# Patient Record
Sex: Female | Born: 1960 | Race: White | Hispanic: No | Marital: Married | Smoking: Former smoker
Health system: Southern US, Community
[De-identification: ages and names within clinical notes are randomized; demographics above are authoritative.]

## PROBLEM LIST (undated history)

## (undated) DIAGNOSIS — E079 Disorder of thyroid, unspecified: Secondary | ICD-10-CM

## (undated) DIAGNOSIS — M1990 Unspecified osteoarthritis, unspecified site: Secondary | ICD-10-CM

## (undated) DIAGNOSIS — K219 Gastro-esophageal reflux disease without esophagitis: Secondary | ICD-10-CM

## (undated) DIAGNOSIS — E785 Hyperlipidemia, unspecified: Secondary | ICD-10-CM

## (undated) DIAGNOSIS — E1165 Type 2 diabetes mellitus with hyperglycemia: Secondary | ICD-10-CM

## (undated) DIAGNOSIS — T7840XA Allergy, unspecified, initial encounter: Secondary | ICD-10-CM

## (undated) DIAGNOSIS — R51 Headache: Secondary | ICD-10-CM

## (undated) DIAGNOSIS — I10 Essential (primary) hypertension: Secondary | ICD-10-CM

## (undated) HISTORY — PX: BREAST SURGERY: SHX581

## (undated) HISTORY — DX: Hyperlipidemia, unspecified: E78.5

## (undated) HISTORY — DX: Unspecified osteoarthritis, unspecified site: M19.90

## (undated) HISTORY — DX: Gastro-esophageal reflux disease without esophagitis: K21.9

## (undated) HISTORY — DX: Disorder of thyroid, unspecified: E07.9

## (undated) HISTORY — DX: Allergy, unspecified, initial encounter: T78.40XA

## (undated) HISTORY — DX: Headache: R51

## (undated) HISTORY — DX: Type 2 diabetes mellitus with hyperglycemia: E11.65

## (undated) HISTORY — DX: Essential (primary) hypertension: I10

---

## 1999-03-18 ENCOUNTER — Emergency Department (HOSPITAL_COMMUNITY): Admission: EM | Admit: 1999-03-18 | Discharge: 1999-03-19 | Payer: Self-pay | Admitting: Emergency Medicine

## 1999-03-18 ENCOUNTER — Encounter: Payer: Self-pay | Admitting: Emergency Medicine

## 1999-03-19 ENCOUNTER — Encounter: Payer: Self-pay | Admitting: Emergency Medicine

## 2003-06-12 ENCOUNTER — Emergency Department (HOSPITAL_COMMUNITY): Admission: AD | Admit: 2003-06-12 | Discharge: 2003-06-12 | Payer: Self-pay | Admitting: Family Medicine

## 2003-06-12 ENCOUNTER — Emergency Department (HOSPITAL_COMMUNITY): Admission: EM | Admit: 2003-06-12 | Discharge: 2003-06-12 | Payer: Self-pay | Admitting: Emergency Medicine

## 2006-10-04 ENCOUNTER — Encounter: Admission: RE | Admit: 2006-10-04 | Discharge: 2006-10-04 | Payer: Self-pay | Admitting: Internal Medicine

## 2006-10-13 ENCOUNTER — Encounter (INDEPENDENT_AMBULATORY_CARE_PROVIDER_SITE_OTHER): Payer: Self-pay | Admitting: Radiology

## 2006-10-13 ENCOUNTER — Encounter: Admission: RE | Admit: 2006-10-13 | Discharge: 2006-10-13 | Payer: Self-pay | Admitting: Internal Medicine

## 2008-06-04 ENCOUNTER — Ambulatory Visit: Payer: Self-pay | Admitting: Internal Medicine

## 2008-06-04 DIAGNOSIS — I10 Essential (primary) hypertension: Secondary | ICD-10-CM | POA: Insufficient documentation

## 2008-06-04 DIAGNOSIS — K219 Gastro-esophageal reflux disease without esophagitis: Secondary | ICD-10-CM | POA: Insufficient documentation

## 2008-06-04 DIAGNOSIS — M533 Sacrococcygeal disorders, not elsewhere classified: Secondary | ICD-10-CM | POA: Insufficient documentation

## 2008-06-04 DIAGNOSIS — IMO0002 Reserved for concepts with insufficient information to code with codable children: Secondary | ICD-10-CM | POA: Insufficient documentation

## 2008-06-04 DIAGNOSIS — E785 Hyperlipidemia, unspecified: Secondary | ICD-10-CM | POA: Insufficient documentation

## 2008-06-04 DIAGNOSIS — M159 Polyosteoarthritis, unspecified: Secondary | ICD-10-CM | POA: Insufficient documentation

## 2008-06-04 LAB — CONVERTED CEMR LAB
AST: 20 units/L (ref 0–37)
Albumin: 4.1 g/dL (ref 3.5–5.2)
BUN: 15 mg/dL (ref 6–23)
Basophils Absolute: 0.1 10*3/uL (ref 0.0–0.1)
Bilirubin Urine: NEGATIVE
Chloride: 98 meq/L (ref 96–112)
Creatinine, Ser: 1.2 mg/dL (ref 0.4–1.2)
Crystals: NEGATIVE
Folate: 11.5 ng/mL
GFR calc Af Amer: 62 mL/min
GFR calc non Af Amer: 51 mL/min
Hgb A1c MFr Bld: 5.7 % (ref 4.6–6.0)
Iron: 98 ug/dL (ref 42–145)
Leukocytes, UA: NEGATIVE
Lymphocytes Relative: 34.9 % (ref 12.0–46.0)
MCHC: 35 g/dL (ref 30.0–36.0)
Monocytes Relative: 8 % (ref 3.0–12.0)
Nitrite: POSITIVE — AB
Platelets: 246 10*3/uL (ref 150–400)
RDW: 12.3 % (ref 11.5–14.6)
Saturation Ratios: 21.3 % (ref 20.0–50.0)
Sed Rate: 14 mm/hr (ref 0–22)
Total Bilirubin: 0.7 mg/dL (ref 0.3–1.2)
Transferrin: 329.1 mg/dL (ref >=212.0)
Urobilinogen, UA: 0.2 (ref 0.0–1.0)
Vitamin B-12: 718 pg/mL (ref 211–911)

## 2008-06-05 ENCOUNTER — Telehealth: Payer: Self-pay | Admitting: Internal Medicine

## 2008-06-05 ENCOUNTER — Encounter: Payer: Self-pay | Admitting: Internal Medicine

## 2008-06-18 ENCOUNTER — Ambulatory Visit: Payer: Self-pay | Admitting: Internal Medicine

## 2008-06-18 DIAGNOSIS — E039 Hypothyroidism, unspecified: Secondary | ICD-10-CM | POA: Insufficient documentation

## 2008-06-18 DIAGNOSIS — R7309 Other abnormal glucose: Secondary | ICD-10-CM | POA: Insufficient documentation

## 2008-06-18 LAB — CONVERTED CEMR LAB
Bilirubin Urine: NEGATIVE
CO2: 28 meq/L (ref 19–32)
Calcium: 8.9 mg/dL (ref 8.4–10.5)
Creatinine, Ser: 0.9 mg/dL (ref 0.4–1.2)
Crystals: NEGATIVE
GFR calc Af Amer: 86 mL/min
HDL: 38.5 mg/dL — ABNORMAL LOW (ref >39.0)
Hgb A1c MFr Bld: 5.6 % (ref 4.6–6.0)
LDL Goal: 160 mg/dL
Nitrite: NEGATIVE
Sodium: 140 meq/L (ref 135–145)
Total CHOL/HDL Ratio: 5.8
Total Protein, Urine: NEGATIVE mg/dL
Triglycerides: 127 mg/dL (ref 0–149)
Urobilinogen, UA: 0.2 (ref 0.0–1.0)
VLDL: 25 mg/dL (ref 0–40)

## 2008-06-19 ENCOUNTER — Encounter: Payer: Self-pay | Admitting: Internal Medicine

## 2008-06-20 ENCOUNTER — Encounter: Payer: Self-pay | Admitting: Internal Medicine

## 2008-06-25 ENCOUNTER — Telehealth: Payer: Self-pay | Admitting: Internal Medicine

## 2008-07-20 ENCOUNTER — Encounter (INDEPENDENT_AMBULATORY_CARE_PROVIDER_SITE_OTHER): Payer: Self-pay | Admitting: *Deleted

## 2008-07-26 ENCOUNTER — Ambulatory Visit: Payer: Self-pay | Admitting: Internal Medicine

## 2008-07-26 LAB — CONVERTED CEMR LAB: TSH: 4.65 microintl units/mL (ref 0.35–5.50)

## 2008-08-21 ENCOUNTER — Encounter
Admission: RE | Admit: 2008-08-21 | Discharge: 2008-11-19 | Payer: Self-pay | Admitting: Physical Medicine & Rehabilitation

## 2008-08-27 ENCOUNTER — Ambulatory Visit: Payer: Self-pay | Admitting: Physical Medicine & Rehabilitation

## 2008-09-05 ENCOUNTER — Ambulatory Visit (HOSPITAL_COMMUNITY)
Admission: RE | Admit: 2008-09-05 | Discharge: 2008-09-05 | Payer: Self-pay | Admitting: Physical Medicine & Rehabilitation

## 2008-09-11 ENCOUNTER — Ambulatory Visit: Payer: Self-pay | Admitting: Physical Medicine & Rehabilitation

## 2008-10-01 ENCOUNTER — Ambulatory Visit: Payer: Self-pay | Admitting: Internal Medicine

## 2008-10-11 ENCOUNTER — Encounter
Admission: RE | Admit: 2008-10-11 | Discharge: 2008-12-04 | Payer: Self-pay | Admitting: Physical Medicine & Rehabilitation

## 2008-10-15 ENCOUNTER — Ambulatory Visit: Payer: Self-pay | Admitting: Physical Medicine & Rehabilitation

## 2008-10-18 ENCOUNTER — Ambulatory Visit: Payer: Self-pay | Admitting: Physical Medicine & Rehabilitation

## 2008-11-05 ENCOUNTER — Telehealth: Payer: Self-pay | Admitting: Internal Medicine

## 2008-11-05 ENCOUNTER — Ambulatory Visit: Payer: Self-pay | Admitting: Internal Medicine

## 2008-11-05 DIAGNOSIS — Z780 Asymptomatic menopausal state: Secondary | ICD-10-CM | POA: Insufficient documentation

## 2008-11-05 DIAGNOSIS — N390 Urinary tract infection, site not specified: Secondary | ICD-10-CM | POA: Insufficient documentation

## 2008-11-05 LAB — CONVERTED CEMR LAB
CO2: 29 meq/L (ref 19–32)
Chloride: 108 meq/L (ref 96–112)
Creatinine, Ser: 1 mg/dL (ref 0.4–1.2)
Glucose, Bld: 97 mg/dL (ref 70–99)
Leukocytes, UA: NEGATIVE
Nitrite: NEGATIVE
Sodium: 142 meq/L (ref 135–145)
Specific Gravity, Urine: 1.015 (ref 1.000–1.030)
Total Protein, Urine: NEGATIVE mg/dL
hCG, Beta Chain, Quant, S: 0.18 milliintl units/mL
pH: 6 (ref 5.0–8.0)

## 2008-11-08 ENCOUNTER — Telehealth: Payer: Self-pay | Admitting: Internal Medicine

## 2008-11-29 ENCOUNTER — Ambulatory Visit: Payer: Self-pay | Admitting: Physical Medicine & Rehabilitation

## 2009-02-14 ENCOUNTER — Ambulatory Visit: Payer: Self-pay | Admitting: Internal Medicine

## 2009-02-15 ENCOUNTER — Encounter: Payer: Self-pay | Admitting: Internal Medicine

## 2009-03-05 ENCOUNTER — Encounter: Payer: Self-pay | Admitting: Internal Medicine

## 2009-07-18 ENCOUNTER — Ambulatory Visit: Payer: Self-pay | Admitting: Internal Medicine

## 2009-07-18 DIAGNOSIS — E8881 Metabolic syndrome: Secondary | ICD-10-CM | POA: Insufficient documentation

## 2009-07-18 LAB — CONVERTED CEMR LAB
ALT: 23 units/L (ref 0–35)
BUN: 12 mg/dL (ref 6–23)
CO2: 28 meq/L (ref 19–32)
Chloride: 103 meq/L (ref 96–112)
Eosinophils Relative: 1.2 % (ref 0.0–5.0)
Folate: 18.3 ng/mL
Glucose, Bld: 100 mg/dL — ABNORMAL HIGH (ref 70–99)
HCT: 38.1 % (ref 36.0–46.0)
Hgb A1c MFr Bld: 6 % (ref 4.6–6.5)
Iron: 39 ug/dL — ABNORMAL LOW (ref 42–145)
Ketones, ur: NEGATIVE mg/dL
Lymphs Abs: 2.4 10*3/uL (ref 0.7–4.0)
MCHC: 32.7 g/dL (ref 30.0–36.0)
MCV: 82.5 fL (ref 78.0–100.0)
Monocytes Absolute: 0.4 10*3/uL (ref 0.1–1.0)
Platelets: 244 10*3/uL (ref 150.0–400.0)
Potassium: 4.1 meq/L (ref 3.5–5.1)
Saturation Ratios: 7.5 % — ABNORMAL LOW (ref 20.0–50.0)
TSH: 6.87 microintl units/mL — ABNORMAL HIGH (ref 0.35–5.50)
Total Bilirubin: 0.4 mg/dL (ref 0.3–1.2)
Total Protein: 7.2 g/dL (ref 6.0–8.3)
Transferrin: 373.4 mg/dL — ABNORMAL HIGH (ref 212.0–360.0)
Urine Glucose: NEGATIVE mg/dL
Urobilinogen, UA: 0.2 (ref 0.0–1.0)
VLDL: 28.6 mg/dL (ref 0.0–40.0)
Vitamin B-12: 746 pg/mL (ref 211–911)
WBC: 6.5 10*3/uL (ref 4.5–10.5)

## 2009-08-01 ENCOUNTER — Ambulatory Visit: Payer: Self-pay | Admitting: Internal Medicine

## 2009-11-07 ENCOUNTER — Ambulatory Visit: Payer: Self-pay | Admitting: Internal Medicine

## 2009-11-07 LAB — CONVERTED CEMR LAB
Basophils Relative: 1.5 % (ref 0.0–3.0)
Bilirubin, Direct: 0.1 mg/dL (ref 0.0–0.3)
CRP, High Sensitivity: 3.83 (ref 0.00–5.00)
Calcium: 9.3 mg/dL (ref 8.4–10.5)
Creatinine, Ser: 1 mg/dL (ref 0.4–1.2)
Eosinophils Absolute: 0.1 10*3/uL (ref 0.0–0.7)
Eosinophils Relative: 2 % (ref 0.0–5.0)
Folate: 15.5 ng/mL
Hemoglobin: 15.2 g/dL — ABNORMAL HIGH (ref 12.0–15.0)
Hgb A1c MFr Bld: 5.9 % (ref 4.6–6.5)
Iron: 131 ug/dL (ref 42–145)
Lymphocytes Relative: 39.6 % (ref 12.0–46.0)
MCHC: 35.4 g/dL (ref 30.0–36.0)
MCV: 89.5 fL (ref 78.0–100.0)
Neutro Abs: 2.5 10*3/uL (ref 1.4–7.7)
RBC: 4.79 M/uL (ref 3.87–5.11)
Saturation Ratios: 32.8 % (ref 20.0–50.0)
Sed Rate: 9 mm/hr (ref 0–22)
Total Bilirubin: 0.5 mg/dL (ref 0.3–1.2)
Total Protein: 6.6 g/dL (ref 6.0–8.3)
Transferrin: 285.3 mg/dL (ref 212.0–360.0)
Vitamin B-12: 788 pg/mL (ref 211–911)

## 2009-11-14 ENCOUNTER — Encounter: Payer: Self-pay | Admitting: Internal Medicine

## 2009-11-15 ENCOUNTER — Encounter: Payer: Self-pay | Admitting: Internal Medicine

## 2009-11-15 ENCOUNTER — Ambulatory Visit: Payer: Self-pay

## 2009-12-12 ENCOUNTER — Ambulatory Visit: Payer: Self-pay | Admitting: Internal Medicine

## 2009-12-12 DIAGNOSIS — R209 Unspecified disturbances of skin sensation: Secondary | ICD-10-CM | POA: Insufficient documentation

## 2010-01-02 ENCOUNTER — Encounter: Payer: Self-pay | Admitting: Internal Medicine

## 2010-01-10 ENCOUNTER — Ambulatory Visit: Payer: Self-pay | Admitting: Internal Medicine

## 2010-01-10 LAB — CONVERTED CEMR LAB: Cholesterol, target level: 200 mg/dL

## 2010-03-04 LAB — HM MAMMOGRAPHY: HM Mammogram: NORMAL

## 2010-06-26 NOTE — Assessment & Plan Note (Signed)
Summary: 3 mos f/u // #/ cd   Vital Signs:  Patient profile:   50 year old female Menstrual status:  regular Height:      69 inches Weight:      220 pounds BMI:     32.61 O2 Sat:      98 % on Room air Temp:     98.2 degrees F oral Pulse rate:   68 / minute Pulse rhythm:   regular Resp:     16 per minute BP sitting:   150 / 98  (left arm) Cuff size:   large  Vitals Entered By: Rock Nephew CMA (November 07, 2009 8:04 AM)  Nutrition Counseling: Patient's BMI is greater than 25 and therefore counseled on weight management options.  O2 Flow:  Room air  Primary Care Provider:  Etta Grandchild MD   History of Present Illness: She returns c/o worsening cramps in her toes when she exercises and an occasional numbness in her legs at night.  Hypertension History:      She denies headache, chest pain, palpitations, dyspnea with exertion, orthopnea, PND, peripheral edema, visual symptoms, neurologic problems, syncope, and side effects from treatment.  She notes no problems with any antihypertensive medication side effects.        Positive major cardiovascular risk factors include diabetes, hyperlipidemia, and hypertension.  Negative major cardiovascular risk factors include female age less than 66 years old, negative family history for ischemic heart disease, and non-tobacco-user status.        Positive history for target organ damage include peripheral vascular disease.  Further assessment for target organ damage reveals no history of ASHD, cardiac end-organ damage (CHF/LVH), stroke/TIA, renal insufficiency, or hypertensive retinopathy.     Preventive Screening-Counseling & Management  Alcohol-Tobacco     Alcohol drinks/day: <1     Alcohol type: wine     >5/day in last 3 mos: no     Alcohol Counseling: not indicated; use of alcohol is not excessive or problematic     Feels need to cut down: no     Feels annoyed by complaints: no     Feels guilty re: drinking: no     Needs 'eye  opener' in am: no     Smoking Status: quit > 6 months     Year Quit: 2002     Pack years: 13     Passive Smoke Exposure: no  Current Medications (verified): 1)  Prednisolone Acetate 1 % Susp (Prednisolone Acetate) 2)  Dorzolamide-Timolol 2-0.5 % Soln (Dorzolamide-Timolol) .Marland Kitchen.. 1drop Each Eye Once Daily 3)  Famciclovir 500 Mg Tabs (Famciclovir) 4)  Asa 81mg  5)  Ibuprofen 200 Mg Caps (Ibuprofen) 6)  Multivitamin 7)  Microgestin 1.5/30 1.5-30 Mg-Mcg Tabs (Norethindrone Acet-Ethinyl Est) 8)  Synthroid 125 Mcg Tabs (Levothyroxine Sodium) .... One By Mouth Once Daily 9)  Calcium 600 +d 10)  Iron 65mg   Allergies (verified): 1)  ! * Mycins  Family History: Reviewed history from 06/04/2008 and no changes required. Family History of Alcoholism/Addiction Family History of Arthritis Family History Diabetes 1st degree relative Family History High cholesterol Family History Hypertension Family History of Stroke F 1st degree relative <60  Social History: Reviewed history from 06/04/2008 and no changes required. Married Alcohol use-no Drug use-no Regular exercise-yes Unemployed  Review of Systems  The patient denies anorexia, fever, weight loss, weight gain, chest pain, syncope, dyspnea on exertion, peripheral edema, prolonged cough, headaches, hemoptysis, abdominal pain, suspicious skin lesions, difficulty walking, and enlarged lymph nodes.  CV:  Complains of leg cramps with exertion; denies chest pain or discomfort, fainting, fatigue, lightheadness, near fainting, palpitations, shortness of breath with exertion, swelling of feet, and weight gain. MS:  Denies joint pain, joint redness, joint swelling, loss of strength, low back pain, mid back pain, muscle aches, muscle weakness, and stiffness. Neuro:  Complains of numbness; denies brief paralysis, difficulty with concentration, disturbances in coordination, falling down, inability to speak, poor balance, sensation of room spinning,  tingling, tremors, visual disturbances, and weakness. Endo:  Denies cold intolerance, excessive hunger, excessive thirst, excessive urination, heat intolerance, polyuria, and weight change. Heme:  Denies abnormal bruising, bleeding, enlarge lymph nodes, fevers, pallor, and skin discoloration.  Physical Exam  General:  alert, well-developed, well-nourished, well-hydrated, appropriate dress, normal appearance, healthy-appearing, and overweight-appearing.   Head:  normocephalic and atraumatic.   Eyes:  vision grossly intact, no injection, and no nystagmus.   Mouth:  Oral mucosa and oropharynx without lesions or exudates.  Teeth in good repair. Neck:  supple, full ROM, no masses, no thyromegaly, no thyroid nodules or tenderness, no carotid bruits, and no cervical lymphadenopathy.   Lungs:  Normal respiratory effort, chest expands symmetrically. Lungs are clear to auscultation, no crackles or wheezes. Heart:  Normal rate and regular rhythm. S1 and S2 normal without gallop, murmur, click, rub or other extra sounds. Abdomen:  soft, non-tender, normal bowel sounds, no distention, no masses, no guarding, no rigidity, no rebound tenderness, no hepatomegaly, and no splenomegaly.   Msk:  normal ROM, no joint tenderness, and no joint swelling.   Pulses:  R and L carotid,radial,femoral,dorsalis pedis and posterior tibial pulses are full and equal bilaterally Extremities:  No clubbing, cyanosis, edema, or deformity noted with normal full range of motion of all joints.   Neurologic:  No cranial nerve deficits noted. Station and gait are normal. Plantar reflexes are down-going bilaterally. DTRs are symmetrical throughout. Sensory, motor and coordinative functions appear intact. Skin:  turgor normal, color normal, no rashes, no suspicious lesions, no ecchymoses, no petechiae, no ulcerations, and no edema.   Cervical Nodes:  no anterior cervical adenopathy and no posterior cervical adenopathy.   Axillary Nodes:  no  R axillary adenopathy and no L axillary adenopathy.   Psych:  Cognition and judgment appear intact. Alert and cooperative with normal attention span and concentration. No apparent delusions, illusions, hallucinations   Impression & Recommendations:  Problem # 1:  CLAUDICATION, INTERMITTENT (ICD-443.9) Assessment New  Orders: LE Arterial Doppler/ABI (Le arterial doppler)  Problem # 2:  MUSCLE CRAMPS, FOOT (ICD-729.82) Assessment: Deteriorated  Orders: Venipuncture (86578) TLB-B12 + Folate Pnl (46962_95284-X32/GMW) TLB-IBC Pnl (Iron/FE;Transferrin) (83550-IBC) TLB-BMP (Basic Metabolic Panel-BMET) (80048-METABOL) TLB-Hepatic/Liver Function Pnl (80076-HEPATIC) TLB-CBC Platelet - w/Differential (85025-CBCD) TLB-TSH (Thyroid Stimulating Hormone) (84443-TSH) TLB-CK Total Only(Creatine Kinase/CPK) (82550-CK) TLB-Sedimentation Rate (ESR) (85652-ESR) TLB-Rheumatoid Factor (RA) (86431-RA) TLB-CRP-High Sensitivity (C-Reactive Protein) (86140-FCRP) TLB-A1C / Hgb A1C (Glycohemoglobin) (83036-A1C)  Problem # 3:  HYPOTHYROIDISM (ICD-244.9) Assessment: Unchanged  Her updated medication list for this problem includes:    Synthroid 125 Mcg Tabs (Levothyroxine sodium) ..... One by mouth once daily  Orders: Venipuncture (10272) TLB-B12 + Folate Pnl (53664_40347-Q25/ZDG) TLB-IBC Pnl (Iron/FE;Transferrin) (83550-IBC) TLB-BMP (Basic Metabolic Panel-BMET) (80048-METABOL) TLB-Hepatic/Liver Function Pnl (80076-HEPATIC) TLB-CBC Platelet - w/Differential (85025-CBCD) TLB-TSH (Thyroid Stimulating Hormone) (84443-TSH) TLB-CK Total Only(Creatine Kinase/CPK) (82550-CK) TLB-Sedimentation Rate (ESR) (85652-ESR) TLB-Rheumatoid Factor (RA) (38756-EP) TLB-CRP-High Sensitivity (C-Reactive Protein) (86140-FCRP) TLB-A1C / Hgb A1C (Glycohemoglobin) (83036-A1C)  Labs Reviewed: TSH: 6.87 (07/18/2009)   Total T4: 7.7 (07/26/2008)    HgBA1c: 6.0 (  07/18/2009) Chol: 253 (07/18/2009)   HDL: 54.20 (07/18/2009)    LDL: DEL (06/18/2008)   TG: 143.0 (07/18/2009)  Problem # 4:  FASTING HYPERGLYCEMIA (ICD-790.29) Assessment: Unchanged  Orders: Venipuncture (81191) TLB-B12 + Folate Pnl (47829_56213-Y86/VHQ) TLB-IBC Pnl (Iron/FE;Transferrin) (83550-IBC) TLB-BMP (Basic Metabolic Panel-BMET) (80048-METABOL) TLB-Hepatic/Liver Function Pnl (80076-HEPATIC) TLB-CBC Platelet - w/Differential (85025-CBCD) TLB-TSH (Thyroid Stimulating Hormone) (84443-TSH) TLB-CK Total Only(Creatine Kinase/CPK) (82550-CK) TLB-Sedimentation Rate (ESR) (85652-ESR) TLB-Rheumatoid Factor (RA) (46962-XB) TLB-CRP-High Sensitivity (C-Reactive Protein) (86140-FCRP) TLB-A1C / Hgb A1C (Glycohemoglobin) (83036-A1C)  Labs Reviewed: Creat: 0.9 (07/18/2009)     Problem # 5:  HYPERTENSION (ICD-401.9) Assessment: Deteriorated  Her updated medication list for this problem includes:    Valturna 150-160 Mg Tabs (Aliskiren-valsartan) ..... One by mouth once daily for high blood pressure  Orders: Venipuncture (28413) TLB-B12 + Folate Pnl (24401_02725-D66/YQI) TLB-IBC Pnl (Iron/FE;Transferrin) (83550-IBC) TLB-BMP (Basic Metabolic Panel-BMET) (80048-METABOL) TLB-Hepatic/Liver Function Pnl (80076-HEPATIC) TLB-CBC Platelet - w/Differential (85025-CBCD) TLB-TSH (Thyroid Stimulating Hormone) (84443-TSH) TLB-CK Total Only(Creatine Kinase/CPK) (82550-CK) TLB-Sedimentation Rate (ESR) (85652-ESR) TLB-Rheumatoid Factor (RA) (34742-VZ) TLB-CRP-High Sensitivity (C-Reactive Protein) (86140-FCRP) TLB-A1C / Hgb A1C (Glycohemoglobin) (83036-A1C)  BP today: 150/98 Prior BP: 144/90 (08/01/2009)  Prior 10 Yr Risk Heart Disease: Not enough information (10/01/2008)  Labs Reviewed: K+: 4.1 (07/18/2009) Creat: : 0.9 (07/18/2009)   Chol: 253 (07/18/2009)   HDL: 54.20 (07/18/2009)   LDL: DEL (06/18/2008)   TG: 143.0 (07/18/2009)  Problem # 6:  HYPERLIPIDEMIA (ICD-272.4) Assessment: Deteriorated  Her updated medication list for this problem includes:     Crestor 20 Mg Tabs (Rosuvastatin calcium) ..... One by mouth once daily for cholesterol  Labs Reviewed: SGOT: 20 (07/18/2009)   SGPT: 23 (07/18/2009)  Lipid Goals: Chol Goal: 200 (07/18/2009)   HDL Goal: 40 (07/18/2009)   LDL Goal: 100 (07/18/2009)   TG Goal: 150 (07/18/2009)  Prior 10 Yr Risk Heart Disease: Not enough information (10/01/2008)   HDL:54.20 (07/18/2009), 38.5 (06/18/2008)  LDL:DEL (06/18/2008)  Chol:253 (07/18/2009), 224 (06/18/2008)  Trig:143.0 (07/18/2009), 127 (06/18/2008)  Complete Medication List: 1)  Prednisolone Acetate 1 % Susp (Prednisolone acetate) 2)  Dorzolamide-timolol 2-0.5 % Soln (Dorzolamide-timolol) .Marland Kitchen.. 1drop each eye once daily 3)  Famciclovir 500 Mg Tabs (Famciclovir) 4)  Asa 81mg   5)  Ibuprofen 200 Mg Caps (Ibuprofen) 6)  Multivitamin  7)  Microgestin 1.5/30 1.5-30 Mg-mcg Tabs (Norethindrone acet-ethinyl est) 8)  Synthroid 125 Mcg Tabs (Levothyroxine sodium) .... One by mouth once daily 9)  Calcium 600 +d  10)  Iron 65mg   11)  Crestor 20 Mg Tabs (Rosuvastatin calcium) .... One by mouth once daily for cholesterol 12)  Valturna 150-160 Mg Tabs (Aliskiren-valsartan) .... One by mouth once daily for high blood pressure  Hypertension Assessment/Plan:      The patient's hypertensive risk group is category C: Target organ damage and/or diabetes.  Today's blood pressure is 150/98.  Her blood pressure goal is < 130/80.  Patient Instructions: 1)  Please schedule a follow-up appointment in 1 month. 2)  It is important that you exercise regularly at least 20 minutes 5 times a week. If you develop chest pain, have severe difficulty breathing, or feel very tired , stop exercising immediately and seek medical attention. 3)  You need to lose weight. Consider a lower calorie diet and regular exercise.  4)  Check your Blood Pressure regularly. If it is above 130/80: you should make an appointment. Prescriptions: VALTURNA 150-160 MG TABS (ALISKIREN-VALSARTAN) One  by mouth once daily for high blood pressure  #70 x 0   Entered and Authorized by:  Etta Grandchild MD   Signed by:   Etta Grandchild MD on 11/07/2009   Method used:   Samples Given   RxID:   [id number] CRESTOR 20 MG TABS (ROSUVASTATIN CALCIUM) One by mouth once daily for cholesterol  #70 x 0   Entered and Authorized by:   Etta Grandchild MD   Signed by:   Etta Grandchild MD on 11/07/2009   Method used:   Samples Given   RxID:   [id number]

## 2010-06-26 NOTE — Assessment & Plan Note (Signed)
Summary: 1 mos f/u / #/cd   Vital Signs:  Patient profile:   50 year old female Menstrual status:  regular Height:      69 inches Weight:      219 pounds BMI:     32.46 O2 Sat:      97 % on Room air Temp:     97.7 degrees F oral Pulse rate:   81 / minute Pulse rhythm:   regular Resp:     16 per minute BP sitting:   114 / 80  (left arm) Cuff size:   large  Vitals Entered By: Rock Nephew CMA (December 12, 2009 8:06 AM)  Nutrition Counseling: Patient's BMI is greater than 25 and therefore counseled on weight management options.  O2 Flow:  Room air  Primary Care Provider:  Etta Grandchild MD   History of Present Illness: She returns with persistent episodes of numbness and tingling that occur usually at about 3 pm and last 3-5 minutes and resolve. Her cramps and claudication have resolved. Her ABI's were normal.  Hypertension History:      She denies headache, chest pain, palpitations, dyspnea with exertion, orthopnea, PND, peripheral edema, visual symptoms, neurologic problems, syncope, and side effects from treatment.  She notes no problems with any antihypertensive medication side effects.        Positive major cardiovascular risk factors include diabetes, hyperlipidemia, and hypertension.  Negative major cardiovascular risk factors include female age less than 65 years old, negative family history for ischemic heart disease, and non-tobacco-user status.        Positive history for target organ damage include peripheral vascular disease.  Further assessment for target organ damage reveals no history of ASHD, cardiac end-organ damage (CHF/LVH), stroke/TIA, renal insufficiency, or hypertensive retinopathy.     Current Medications (verified): 1)  Prednisolone Acetate 1 % Susp (Prednisolone Acetate) 2)  Dorzolamide-Timolol 2-0.5 % Soln (Dorzolamide-Timolol) .Marland Kitchen.. 1drop Each Eye Once Daily 3)  Famciclovir 500 Mg Tabs (Famciclovir) 4)  Asa 81mg  5)  Ibuprofen 200 Mg Caps  (Ibuprofen) 6)  Multivitamin 7)  Microgestin 1.5/30 1.5-30 Mg-Mcg Tabs (Norethindrone Acet-Ethinyl Est) 8)  Synthroid 125 Mcg Tabs (Levothyroxine Sodium) .... One By Mouth Once Daily 9)  Calcium 600 +d 10)  Iron 65mg  11)  Crestor 20 Mg Tabs (Rosuvastatin Calcium) .... One By Mouth Once Daily For Cholesterol 12)  Valturna 150-160 Mg Tabs (Aliskiren-Valsartan) .... One By Mouth Once Daily For High Blood Pressure  Allergies (verified): 1)  ! * Mycins  Past History:  Past Medical History: Last updated: 06/04/2008 GERD Headache Hyperlipidemia Hypertension Osteoarthritis  Past Surgical History: Last updated: 06/04/2008 Lumpectomy  Family History: Last updated: 06/04/2008 Family History of Alcoholism/Addiction Family History of Arthritis Family History Diabetes 1st degree relative Family History High cholesterol Family History Hypertension Family History of Stroke F 1st degree relative <60  Social History: Last updated: 06/04/2008 Married Alcohol use-no Drug use-no Regular exercise-yes Unemployed  Risk Factors: Alcohol Use: <1 (11/07/2009) >5 drinks/d w/in last 3 months: no (11/07/2009) Exercise: yes (06/04/2008)  Risk Factors: Smoking Status: quit > 6 months (11/07/2009) Passive Smoke Exposure: no (11/07/2009)  Family History: Reviewed history from 06/04/2008 and no changes required. Family History of Alcoholism/Addiction Family History of Arthritis Family History Diabetes 1st degree relative Family History High cholesterol Family History Hypertension Family History of Stroke F 1st degree relative <60  Social History: Reviewed history from 06/04/2008 and no changes required. Married Alcohol use-no Drug use-no Regular exercise-yes Unemployed  Review of Systems  The patient denies anorexia, fever, chest pain, abdominal pain, suspicious skin lesions, and enlarged lymph nodes.   Neuro:  Complains of numbness and tingling; denies brief paralysis,  difficulty with concentration, disturbances in coordination, falling down, headaches, inability to speak, memory loss, poor balance, seizures, sensation of room spinning, tremors, and weakness. Endo:  Denies cold intolerance, excessive hunger, excessive thirst, excessive urination, heat intolerance, polyuria, and weight change.  Physical Exam  General:  alert, well-developed, well-nourished, well-hydrated, appropriate dress, normal appearance, healthy-appearing, and overweight-appearing.   Head:  normocephalic and atraumatic.   Mouth:  Oral mucosa and oropharynx without lesions or exudates.  Teeth in good repair. Neck:  supple, full ROM, no masses, no thyromegaly, no thyroid nodules or tenderness, no carotid bruits, and no cervical lymphadenopathy.   Lungs:  Normal respiratory effort, chest expands symmetrically. Lungs are clear to auscultation, no crackles or wheezes. Heart:  Normal rate and regular rhythm. S1 and S2 normal without gallop, murmur, click, rub or other extra sounds. Abdomen:  soft, non-tender, normal bowel sounds, no distention, no masses, no guarding, no rigidity, no rebound tenderness, no hepatomegaly, and no splenomegaly.   Msk:  normal ROM, no joint tenderness, and no joint swelling.   Pulses:  R and L carotid,radial,femoral,dorsalis pedis and posterior tibial pulses are full and equal bilaterally Extremities:  No clubbing, cyanosis, edema, or deformity noted with normal full range of motion of all joints.   Neurologic:  No cranial nerve deficits noted. Station and gait are normal. Plantar reflexes are down-going bilaterally. DTRs are symmetrical throughout. Sensory, motor and coordinative functions appear intact. Skin:  turgor normal, color normal, no rashes, no suspicious lesions, no ecchymoses, no petechiae, no ulcerations, and no edema.   Cervical Nodes:  no anterior cervical adenopathy and no posterior cervical adenopathy.   Psych:  Cognition and judgment appear intact.  Alert and cooperative with normal attention span and concentration. No apparent delusions, illusions, hallucinations   Impression & Recommendations:  Problem # 1:  PARESTHESIA (ICD-782.0) Assessment New will order NCS/EMG Orders: Neurology Referral (Neuro)  Problem # 2:  HYPOTHYROIDISM (ICD-244.9) Assessment: Improved  Her updated medication list for this problem includes:    Synthroid 125 Mcg Tabs (Levothyroxine sodium) ..... One by mouth once daily  Labs Reviewed: TSH: 1.00 (11/07/2009)   Total T4: 7.7 (07/26/2008)    HgBA1c: 5.9 (11/07/2009) Chol: 253 (07/18/2009)   HDL: 54.20 (07/18/2009)   LDL: DEL (06/18/2008)   TG: 143.0 (07/18/2009)  Problem # 3:  FASTING HYPERGLYCEMIA (ICD-790.29) Assessment: Unchanged  Labs Reviewed: Creat: 1.0 (11/07/2009)     Problem # 4:  HYPERTENSION (ICD-401.9) Assessment: Improved  Her updated medication list for this problem includes:    Valturna 150-160 Mg Tabs (Aliskiren-valsartan) ..... One by mouth once daily for high blood pressure  BP today: 114/80 Prior BP: 150/98 (11/07/2009)  Prior 10 Yr Risk Heart Disease: Not enough information (10/01/2008)  Labs Reviewed: K+: 4.3 (11/07/2009) Creat: : 1.0 (11/07/2009)   Chol: 253 (07/18/2009)   HDL: 54.20 (07/18/2009)   LDL: DEL (06/18/2008)   TG: 143.0 (07/18/2009)  Complete Medication List: 1)  Prednisolone Acetate 1 % Susp (Prednisolone acetate) 2)  Dorzolamide-timolol 2-0.5 % Soln (Dorzolamide-timolol) .Marland Kitchen.. 1drop each eye once daily 3)  Famciclovir 500 Mg Tabs (Famciclovir) 4)  Asa 81mg   5)  Ibuprofen 200 Mg Caps (Ibuprofen) 6)  Multivitamin  7)  Microgestin 1.5/30 1.5-30 Mg-mcg Tabs (Norethindrone acet-ethinyl est) 8)  Synthroid 125 Mcg Tabs (Levothyroxine sodium) .... One by mouth once daily 9)  Calcium 600 +d  10)  Iron 65mg   11)  Crestor 20 Mg Tabs (Rosuvastatin calcium) .... One by mouth once daily for cholesterol 12)  Valturna 150-160 Mg Tabs (Aliskiren-valsartan) .... One  by mouth once daily for high blood pressure  Hypertension Assessment/Plan:      The patient's hypertensive risk group is category C: Target organ damage and/or diabetes.  Today's blood pressure is 114/80.  Her blood pressure goal is < 130/80.  Patient Instructions: 1)  Please schedule a follow-up appointment in 1 month. 2)  It is important that you exercise regularly at least 20 minutes 5 times a week. If you develop chest pain, have severe difficulty breathing, or feel very tired , stop exercising immediately and seek medical attention. 3)  You need to lose weight. Consider a lower calorie diet and regular exercise.  4)  Check your Blood Pressure regularly. If it is above 130/80: you should make an appointment.

## 2010-06-26 NOTE — Assessment & Plan Note (Signed)
Summary: 2-3 WK ROV /NWS  #   Vital Signs:  Patient profile:   50 year old female Menstrual status:  regular Height:      69 inches Weight:      218 pounds BMI:     32.31 O2 Sat:      98 % on Room air Temp:     98.0 degrees F oral Pulse rate:   70 / minute Pulse rhythm:   regular Resp:     16 per minute BP sitting:   144 / 90  (left arm) Cuff size:   large  Vitals Entered By: Rock Nephew CMA (August 01, 2009 8:08 AM)  Nutrition Counseling: Patient's BMI is greater than 25 and therefore counseled on weight management options.  O2 Flow:  Room air CC: follow-up visit//discus TSH results Is Patient Diabetic? No Pain Assessment Patient in pain? no        Primary Care Provider:  Etta Grandchild MD  CC:  follow-up visit//discus TSH results.  History of Present Illness: She returns to f/up on her labs. She has struggled with weight gain.  Preventive Screening-Counseling & Management  Alcohol-Tobacco     Alcohol drinks/day: <1     Smoking Status: quit > 6 months     Year Quit: 2002     Pack years: 13     Passive Smoke Exposure: no  Clinical Review Panels:  Diabetes Management   HgBA1C:  6.0 (07/18/2009)   Creatinine:  0.9 (07/18/2009)  CBC   WBC:  6.5 (07/18/2009)   RBC:  4.62 (07/18/2009)   Hgb:  12.5 (07/18/2009)   Hct:  38.1 (07/18/2009)   Platelets:  244.0 (07/18/2009)   MCV  82.5 (07/18/2009)   MCHC  32.7 (07/18/2009)   RDW  12.7 (07/18/2009)   PMN:  55.4 (07/18/2009)   Lymphs:  36.6 (07/18/2009)   Monos:  5.8 (07/18/2009)   Eosinophils:  1.2 (07/18/2009)   Basophil:  1.0 (07/18/2009)  Complete Metabolic Panel   Glucose:  100 (07/18/2009)   Sodium:  137 (07/18/2009)   Potassium:  4.1 (07/18/2009)   Chloride:  103 (07/18/2009)   CO2:  28 (07/18/2009)   BUN:  12 (07/18/2009)   Creatinine:  0.9 (07/18/2009)   Albumin:  3.8 (07/18/2009)   Total Protein:  7.2 (07/18/2009)   Calcium:  9.0 (07/18/2009)   Total Bili:  0.4 (07/18/2009)   Alk Phos:   45 (07/18/2009)   SGPT (ALT):  23 (07/18/2009)   SGOT (AST):  20 (07/18/2009)   Medications Prior to Update: 1)  Prednisolone Acetate 1 % Susp (Prednisolone Acetate) 2)  Dorzolamide-Timolol 2-0.5 % Soln (Dorzolamide-Timolol) .Marland Kitchen.. 1drop Each Eye Once Daily 3)  Famciclovir 500 Mg Tabs (Famciclovir) 4)  Synthroid 100 Mcg Tabs (Levothyroxine Sodium) .... Once Daily 5)  Asa 81mg  6)  Ibuprofen 200 Mg Caps (Ibuprofen)  Current Medications (verified): 1)  Prednisolone Acetate 1 % Susp (Prednisolone Acetate) 2)  Dorzolamide-Timolol 2-0.5 % Soln (Dorzolamide-Timolol) .Marland Kitchen.. 1drop Each Eye Once Daily 3)  Famciclovir 500 Mg Tabs (Famciclovir) 4)  Asa 81mg  5)  Ibuprofen 200 Mg Caps (Ibuprofen) 6)  Multivitamin 7)  Microgestin 1.5/30 1.5-30 Mg-Mcg Tabs (Norethindrone Acet-Ethinyl Est) 8)  Synthroid 125 Mcg Tabs (Levothyroxine Sodium) .... One By Mouth Once Daily  Allergies (verified): 1)  ! * Mycins  Past History:  Past Medical History: Reviewed history from 06/04/2008 and no changes required. GERD Headache Hyperlipidemia Hypertension Osteoarthritis  Past Surgical History: Reviewed history from 06/04/2008 and no changes required. Lumpectomy  Family History: Reviewed history from 06/04/2008 and no changes required. Family History of Alcoholism/Addiction Family History of Arthritis Family History Diabetes 1st degree relative Family History High cholesterol Family History Hypertension Family History of Stroke F 1st degree relative <60  Social History: Reviewed history from 06/04/2008 and no changes required. Married Alcohol use-no Drug use-no Regular exercise-yes Unemployed  Review of Systems Endo:  Complains of weight change; denies cold intolerance, excessive hunger, excessive thirst, excessive urination, heat intolerance, and polyuria.  Physical Exam  General:  alert, well-developed, well-nourished, well-hydrated, appropriate dress, normal appearance, healthy-appearing,  and overweight-appearing.   Head:  normocephalic and atraumatic.   Mouth:  Oral mucosa and oropharynx without lesions or exudates.  Teeth in good repair. Neck:  supple, full ROM, no masses, no thyromegaly, no thyroid nodules or tenderness, no carotid bruits, and no cervical lymphadenopathy.   Lungs:  Normal respiratory effort, chest expands symmetrically. Lungs are clear to auscultation, no crackles or wheezes. Heart:  Normal rate and regular rhythm. S1 and S2 normal without gallop, murmur, click, rub or other extra sounds. Abdomen:  soft, non-tender, normal bowel sounds, no distention, no masses, no guarding, no rigidity, no rebound tenderness, no hepatomegaly, and no splenomegaly.   Msk:  normal ROM, no joint tenderness, and no joint swelling.   Pulses:  R and L carotid,radial,femoral,dorsalis pedis and posterior tibial pulses are full and equal bilaterally Extremities:  No clubbing, cyanosis, edema, or deformity noted with normal full range of motion of all joints.   Neurologic:  No cranial nerve deficits noted. Station and gait are normal. Plantar reflexes are down-going bilaterally. DTRs are symmetrical throughout. Sensory, motor and coordinative functions appear intact. Skin:  turgor normal, color normal, no rashes, no suspicious lesions, no ecchymoses, no petechiae, no ulcerations, and no edema.   Cervical Nodes:  no anterior cervical adenopathy and no posterior cervical adenopathy.   Axillary Nodes:  no R axillary adenopathy and no L axillary adenopathy.   Psych:  Cognition and judgment appear intact. Alert and cooperative with normal attention span and concentration. No apparent delusions, illusions, hallucinations   Impression & Recommendations:  Problem # 1:  HYPOTHYROIDISM (ICD-244.9) Assessment Deteriorated she needs a higher synthroid dosage The following medications were removed from the medication list:    Synthroid 100 Mcg Tabs (Levothyroxine sodium) ..... Once daily Her  updated medication list for this problem includes:    Synthroid 125 Mcg Tabs (Levothyroxine sodium) ..... One by mouth once daily  Labs Reviewed: TSH: 6.87 (07/18/2009)   Total T4: 7.7 (07/26/2008)    HgBA1c: 6.0 (07/18/2009) Chol: 253 (07/18/2009)   HDL: 54.20 (07/18/2009)   LDL: DEL (06/18/2008)   TG: 143.0 (07/18/2009)  Problem # 2:  HYPERLIPIDEMIA (ICD-272.4) Assessment: Deteriorated  she is not ready to start a statin yet, I will raise her synthroid dose and recheck FLP in 3-4 months  Labs Reviewed: SGOT: 20 (07/18/2009)   SGPT: 23 (07/18/2009)  Lipid Goals: Chol Goal: 200 (07/18/2009)   HDL Goal: 40 (07/18/2009)   LDL Goal: 100 (07/18/2009)   TG Goal: 150 (07/18/2009)  Prior 10 Yr Risk Heart Disease: Not enough information (10/01/2008)   HDL:54.20 (07/18/2009), 38.5 (06/18/2008)  LDL:DEL (06/18/2008)  Chol:253 (07/18/2009), 224 (06/18/2008)  Trig:143.0 (07/18/2009), 127 (06/18/2008)  Complete Medication List: 1)  Prednisolone Acetate 1 % Susp (Prednisolone acetate) 2)  Dorzolamide-timolol 2-0.5 % Soln (Dorzolamide-timolol) .Marland Kitchen.. 1drop each eye once daily 3)  Famciclovir 500 Mg Tabs (Famciclovir) 4)  Asa 81mg   5)  Ibuprofen 200 Mg Caps (Ibuprofen)  6)  Multivitamin  7)  Microgestin 1.5/30 1.5-30 Mg-mcg Tabs (Norethindrone acet-ethinyl est) 8)  Synthroid 125 Mcg Tabs (Levothyroxine sodium) .... One by mouth once daily  Patient Instructions: 1)  Please schedule a follow-up appointment in 3 months. 2)  It is important that you exercise regularly at least 20 minutes 5 times a week. If you develop chest pain, have severe difficulty breathing, or feel very tired , stop exercising immediately and seek medical attention. 3)  You need to lose weight. Consider a lower calorie diet and regular exercise.  Prescriptions: SYNTHROID 125 MCG TABS (LEVOTHYROXINE SODIUM) One by mouth once daily  #90 x 1   Entered and Authorized by:   Etta Grandchild MD   Signed by:   Etta Grandchild MD on  08/01/2009   Method used:   Print then Give to Patient   RxID:   [id number]

## 2010-06-26 NOTE — Assessment & Plan Note (Signed)
Summary: CPX-LB   Vital Signs:  Patient profile:   50 year old female Menstrual status:  regular LMP:     07/02/2009 Height:      69 inches Weight:      216 pounds O2 Sat:      90 % on Room air Temp:     98.3 degrees F oral Pulse rate:   64 / minute Pulse rhythm:   regular Resp:     16 per minute BP sitting:   132 / 90  (left arm)  O2 Flow:  Room air  Primary Care Provider:  Etta Grandchild MD   History of Present Illness: She returns c/o nocturnal cramps in her feet and that her feet feel cold all the time. She does not have claudication. She feels much better since Gyn. started hormones.  Dyspepsia History:      There is a prior history of GERD.  The patient does not have a prior history of documented ulcer disease.  The dominant symptom is heartburn or acid reflux.  An H-2 blocker medication is not currently being taken.    Hypertension History:      She denies headache, chest pain, palpitations, dyspnea with exertion, orthopnea, PND, peripheral edema, visual symptoms, neurologic problems, and syncope.        Positive major cardiovascular risk factors include diabetes, hyperlipidemia, and hypertension.  Negative major cardiovascular risk factors include female age less than 52 years old, negative family history for ischemic heart disease, and non-tobacco-user status.        Further assessment for target organ damage reveals no history of ASHD, cardiac end-organ damage (CHF/LVH), stroke/TIA, peripheral vascular disease, renal insufficiency, or hypertensive retinopathy.    Lipid Management History:      Positive NCEP/ATP III risk factors include diabetes, HDL cholesterol less than 40, and hypertension.  Negative NCEP/ATP III risk factors include female age less than 3 years old, no history of early menopause without estrogen hormone replacement, no family history for ischemic heart disease, non-tobacco-user status, no ASHD (atherosclerotic heart disease), no prior stroke/TIA, no  peripheral vascular disease, and no history of aortic aneurysm.        The patient states that she knows about the "Therapeutic Lifestyle Change" diet.  Her compliance with the TLC diet is fair.  The patient expresses understanding of adjunctive measures for cholesterol lowering.  Adjunctive measures started by the patient include aerobic exercise, fiber, and limit alcohol consumpton.       Preventive Screening-Counseling & Management  Alcohol-Tobacco     Alcohol drinks/day: <1     Smoking Status: quit > 6 months     Year Quit: 2002     Pack years: 13     Passive Smoke Exposure: no  Allergies: 1)  ! * Mycins  Past History:  Past Medical History: Reviewed history from 06/04/2008 and no changes required. GERD Headache Hyperlipidemia Hypertension Osteoarthritis  Past Surgical History: Reviewed history from 06/04/2008 and no changes required. Lumpectomy  Family History: Reviewed history from 06/04/2008 and no changes required. Family History of Alcoholism/Addiction Family History of Arthritis Family History Diabetes 1st degree relative Family History High cholesterol Family History Hypertension Family History of Stroke F 1st degree relative <60  Social History: Reviewed history from 06/04/2008 and no changes required. Married Alcohol use-no Drug use-no Regular exercise-yes Unemployed  Review of Systems       The patient complains of weight gain.  The patient denies anorexia, fever, weight loss, chest pain, syncope, dyspnea  on exertion, peripheral edema, prolonged cough, headaches, hemoptysis, abdominal pain, melena, hematochezia, severe indigestion/heartburn, hematuria, suspicious skin lesions, difficulty walking, depression, enlarged lymph nodes, angioedema, and breast masses.   GU:  Denies abnormal vaginal bleeding, discharge, dysuria, hematuria, incontinence, nocturia, urinary frequency, and urinary hesitancy. MS:  Denies joint pain, joint redness, joint swelling,  loss of strength, low back pain, muscle aches, muscle, muscle weakness, stiffness, and thoracic pain. Endo:  Complains of weight change; denies cold intolerance, excessive hunger, excessive thirst, excessive urination, heat intolerance, and polyuria.  Physical Exam  General:  alert, well-developed, well-nourished, well-hydrated, appropriate dress, normal appearance, healthy-appearing, and overweight-appearing.   Head:  normocephalic and atraumatic.   Mouth:  Oral mucosa and oropharynx without lesions or exudates.  Teeth in good repair. Neck:  supple, full ROM, no masses, no thyromegaly, no thyroid nodules or tenderness, no carotid bruits, and no cervical lymphadenopathy.   Lungs:  Normal respiratory effort, chest expands symmetrically. Lungs are clear to auscultation, no crackles or wheezes. Heart:  Normal rate and regular rhythm. S1 and S2 normal without gallop, murmur, click, rub or other extra sounds. Abdomen:  soft, non-tender, normal bowel sounds, no distention, no masses, no guarding, no rigidity, no rebound tenderness, no hepatomegaly, and no splenomegaly.   Msk:  normal ROM, no joint tenderness, and no joint swelling.   Pulses:  R and L carotid,radial,femoral,dorsalis pedis and posterior tibial pulses are full and equal bilaterally Extremities:  No clubbing, cyanosis, edema, or deformity noted with normal full range of motion of all joints.   Neurologic:  No cranial nerve deficits noted. Station and gait are normal. Plantar reflexes are down-going bilaterally. DTRs are symmetrical throughout. Sensory, motor and coordinative functions appear intact. Skin:  turgor normal, color normal, no rashes, no suspicious lesions, no ecchymoses, no petechiae, no ulcerations, and no edema.   Cervical Nodes:  no anterior cervical adenopathy and no posterior cervical adenopathy.   Axillary Nodes:  no R axillary adenopathy and no L axillary adenopathy.   Inguinal Nodes:  no R inguinal adenopathy and no L  inguinal adenopathy.   Psych:  Cognition and judgment appear intact. Alert and cooperative with normal attention span and concentration. No apparent delusions, illusions, hallucinations   Impression & Recommendations:  Problem # 1:  DYSMETABOLIC SYNDROME (ICD-277.7) Assessment New  Problem # 2:  UTI (ICD-599.0) Assessment: Improved  Orders: TLB-B12 + Folate Pnl (04540_98119-J47/WGN) TLB-IBC Pnl (Iron/FE;Transferrin) (83550-IBC) TLB-Lipid Panel (80061-LIPID) TLB-BMP (Basic Metabolic Panel-BMET) (80048-METABOL) TLB-CBC Platelet - w/Differential (85025-CBCD) TLB-Hepatic/Liver Function Pnl (80076-HEPATIC) TLB-TSH (Thyroid Stimulating Hormone) (84443-TSH) TLB-A1C / Hgb A1C (Glycohemoglobin) (83036-A1C) TLB-CK Total Only(Creatine Kinase/CPK) (82550-CK) TLB-Udip w/ Micro (81001-URINE) TLB-Magnesium (Mg) (83735-MG)  Problem # 3:  ENCOUNTER FOR LONG-TERM USE OF OTHER MEDICATIONS (ICD-V58.69) Assessment: Unchanged  Orders: TLB-B12 + Folate Pnl (56213_08657-Q46/NGE) TLB-IBC Pnl (Iron/FE;Transferrin) (83550-IBC) TLB-Lipid Panel (80061-LIPID) TLB-BMP (Basic Metabolic Panel-BMET) (80048-METABOL) TLB-CBC Platelet - w/Differential (85025-CBCD) TLB-Hepatic/Liver Function Pnl (80076-HEPATIC) TLB-TSH (Thyroid Stimulating Hormone) (84443-TSH) TLB-A1C / Hgb A1C (Glycohemoglobin) (83036-A1C) TLB-CK Total Only(Creatine Kinase/CPK) (82550-CK) TLB-Udip w/ Micro (81001-URINE) TLB-Magnesium (Mg) (83735-MG)  Problem # 4:  HYPOTHYROIDISM (ICD-244.9) Assessment: Unchanged  Her updated medication list for this problem includes:    Synthroid 100 Mcg Tabs (Levothyroxine sodium) ..... Once daily  Orders: TLB-B12 + Folate Pnl (95284_13244-W10/UVO) TLB-IBC Pnl (Iron/FE;Transferrin) (83550-IBC) TLB-Lipid Panel (80061-LIPID) TLB-BMP (Basic Metabolic Panel-BMET) (80048-METABOL) TLB-CBC Platelet - w/Differential (85025-CBCD) TLB-Hepatic/Liver Function Pnl (80076-HEPATIC) TLB-TSH (Thyroid Stimulating  Hormone) (84443-TSH) TLB-A1C / Hgb A1C (Glycohemoglobin) (83036-A1C) TLB-CK Total Only(Creatine Kinase/CPK) (82550-CK) TLB-Udip w/ Micro (81001-URINE) TLB-Magnesium (  Mg) (83735-MG)  Labs Reviewed: TSH: 3.00 (02/14/2009)   Total T4: 7.7 (07/26/2008)    HgBA1c: 5.6 (06/18/2008) Chol: 224 (06/18/2008)   HDL: 38.5 (06/18/2008)   LDL: DEL (06/18/2008)   TG: 127 (06/18/2008)  Problem # 5:  FASTING HYPERGLYCEMIA (ICD-790.29) Assessment: Unchanged  Labs Reviewed: Creat: 1.0 (11/05/2008)     Problem # 6:  HYPERTENSION (ICD-401.9) Assessment: Improved  Orders: TLB-B12 + Folate Pnl ([id number]-J47/WGN) TLB-IBC Pnl (Iron/FE;Transferrin) (83550-IBC) TLB-Lipid Panel (80061-LIPID) TLB-BMP (Basic Metabolic Panel-BMET) (80048-METABOL) TLB-CBC Platelet - w/Differential (85025-CBCD) TLB-Hepatic/Liver Function Pnl (80076-HEPATIC) TLB-TSH (Thyroid Stimulating Hormone) (84443-TSH) TLB-A1C / Hgb A1C (Glycohemoglobin) (83036-A1C) TLB-CK Total Only(Creatine Kinase/CPK) (82550-CK) TLB-Udip w/ Micro (81001-URINE) TLB-Magnesium (Mg) (83735-MG)  BP today: 132/90 Prior BP: 138/88 (02/14/2009)  Prior 10 Yr Risk Heart Disease: Not enough information (10/01/2008)  Labs Reviewed: K+: 4.5 (11/05/2008) Creat: : 1.0 (11/05/2008)   Chol: 224 (06/18/2008)   HDL: 38.5 (06/18/2008)   LDL: DEL (06/18/2008)   TG: 127 (06/18/2008)  Problem # 7:  GERD (ICD-530.81) Assessment: Improved  Labs Reviewed: Hgb: 14.5 (06/04/2008)   Hct: 41.3 (06/04/2008)  Complete Medication List: 1)  Prednisolone Acetate 1 % Susp (Prednisolone acetate) 2)  Dorzolamide-timolol 2-0.5 % Soln (Dorzolamide-timolol) .Marland Kitchen.. 1drop each eye once daily 3)  Famciclovir 500 Mg Tabs (Famciclovir) 4)  Synthroid 100 Mcg Tabs (Levothyroxine sodium) .... Once daily 5)  Asa 81mg   6)  Ibuprofen 200 Mg Caps (Ibuprofen)  Hypertension Assessment/Plan:      The patient's hypertensive risk group is category C: Target organ damage and/or diabetes.   Today's blood pressure is 132/90.  Her blood pressure goal is < 130/80.  Lipid Assessment/Plan:      Based on NCEP/ATP III, the patient's risk factor category is "history of diabetes".  The patient's lipid goals are as follows: Total cholesterol goal is 200; LDL cholesterol goal is 100; HDL cholesterol goal is 40; Triglyceride goal is 150.    PAP Screening:    Hx Cervical Dysplasia in last 5 yrs? No    3 normal PAP smears in last 5 yrs? Yes    Last PAP smear:  02/26/2009  PAP Smear Results:    Date of Exam:  02/26/2009    Results:  Normal  Mammogram Screening:    Last Mammogram:  03/05/2009  Mammogram Results:    Date of Exam:  03/05/2009    Results:  Normal Bilateral  Osteoporosis Risk Assessment:  Risk Factors for Fracture or Low Bone Density:   Race (White or Asian):     yes   Smoking status:       quit > 6 months   Thyroid replacement:     yes   Thyroid disease:     yes  Patient Instructions: 1)  Please schedule a follow-up appointment in 2 months. 2)  It is important that you exercise regularly at least 20 minutes 5 times a week. If you develop chest pain, have severe difficulty breathing, or feel very tired , stop exercising immediately and seek medical attention. 3)  You need to lose weight. Consider a lower calorie diet and regular exercise.  4)  Check your Blood Pressure regularly. If it is above 140/90: you should make an appointment. 5)  Take an Aspirin every day.

## 2010-06-26 NOTE — Letter (Signed)
Summary: Results Follow-up Letter  [facility]  [address] [place], [place] [address]   Phone: [phone]  Fax: [phone]    07/18/2009  [address] [place], [place]  [address]  Dear Ms. Seher,   The following are the results of your recent test(s):  Test     Result     B12       normal Iron       low Kidney/liver   normal CBC       normal Thyroid     dose is a little too low Blood suagr     normal average Urine       normal  _________________________________________________________  Please call for an appointment in 2-3 weeks _________________________________________________________ _________________________________________________________ _________________________________________________________  Sincerely,  Sanda Linger MD [facility]

## 2010-06-26 NOTE — Letter (Signed)
Summary: Lipid Letter  [facility]  [address] [place], [place] [address]   Phone: [phone]  Fax: [phone]    07/18/2009  Adriana Young [address] [place], [place]  [address]  Dear Adriana Young:  We have carefully reviewed your last lipid profile from 06/18/2008 and the results are noted below with a summary of recommendations for lipid management.    Cholesterol:       253     Goal: <200   HDL "good" Cholesterol:   24.40     Goal: >40   LDL "bad" Cholesterol:   189     Goal: <100   Triglycerides:       143.0     Goal: <150    WOW    TLC Diet (Therapeutic Lifestyle Change): Saturated Fats & Transfatty acids should be kept < 7% of total calories ***Reduce Saturated Fats Polyunstaurated Fat can be up to 10% of total calories Monounsaturated Fat Fat can be up to 20% of total calories Total Fat should be no greater than 25-35% of total calories Carbohydrates should be 50-60% of total calories Protein should be approximately 15% of total calories Fiber should be at least 20-30 grams a day ***Increased fiber may help lower LDL Total Cholesterol should be < 200mg /day Consider adding plant stanol/sterols to diet (example: Benacol spread) ***A higher intake of unsaturated fat may reduce Triglycerides and Increase HDL    Adjunctive Measures (may lower LIPIDS and reduce risk of Heart Attack) include: Aerobic Exercise (20-30 minutes 3-4 times a week) Limit Alcohol Consumption Weight Reduction Aspirin 75-81 mg a day by mouth (if not allergic or contraindicated) Dietary Fiber 20-30 grams a day by mouth     Current Medications: 1)    Prednisolone Acetate 1 % Susp (Prednisolone acetate) 2)    Dorzolamide-timolol 2-0.5 % Soln (Dorzolamide-timolol) .Marland Kitchen.. 1drop each eye once daily 3)    Famciclovir 500 Mg Tabs (Famciclovir) 4)    Synthroid 100 Mcg Tabs (Levothyroxine sodium) .... Once daily 5)    Asa 81mg   6)    Ibuprofen 200 Mg Caps (Ibuprofen)  If you have any  questions, please call. We appreciate being able to work with you.   Sincerely,    [facility] Adriana Grandchild MD

## 2010-06-26 NOTE — Assessment & Plan Note (Signed)
Summary: FU ON TESTS /NWS  #   Vital Signs:  Patient profile:   50 year old female Menstrual status:  regular Height:      69 inches Weight:      223.38 pounds BMI:     33.11 O2 Sat:      97 % on Room air Temp:     98.6 degrees F oral Pulse rate:   107 / minute Pulse rhythm:   regular Resp:     16 per minute BP sitting:   122 / 78  (left arm) Cuff size:   large  Vitals Entered By: Rock Nephew CMA (January 10, 2010 9:46 AM)  Nutrition Counseling: Patient's BMI is greater than 25 and therefore counseled on weight management options.  O2 Flow:  Room air CC: follow-up visit//test results, Hypertension Management, Lipid Management Is Patient Diabetic? No  Does patient need assistance? Functional Status Self care Ambulation Normal   Primary Care Anyi Fels:  Adriana Grandchild MD  CC:  follow-up visit//test results, Hypertension Management, and Lipid Management.  History of Present Illness: She returns for f/up and to review her NCS/EMG results which were normal. She has mild tailbone pain but says she can't afford to see Dr. Wynn Banker at this time. Her paresthesias and leg cramps have resolved.  Hypertension History:      She denies headache, chest pain, palpitations, dyspnea with exertion, orthopnea, PND, peripheral edema, visual symptoms, neurologic problems, syncope, and side effects from treatment.  She notes no problems with any antihypertensive medication side effects.        Positive major cardiovascular risk factors include diabetes, hyperlipidemia, and hypertension.  Negative major cardiovascular risk factors include female age less than 44 years old, negative family history for ischemic heart disease, and non-tobacco-user status.        Positive history for target organ damage include peripheral vascular disease.  Further assessment for target organ damage reveals no history of ASHD, cardiac end-organ damage (CHF/LVH), stroke/TIA, renal insufficiency, or hypertensive  retinopathy.    Lipid Management History:      Positive NCEP/ATP III risk factors include diabetes, hypertension, and peripheral vascular disease.  Negative NCEP/ATP III risk factors include female age less than 93 years old, no history of early menopause without estrogen hormone replacement, no family history for ischemic heart disease, non-tobacco-user status, no ASHD (atherosclerotic heart disease), no prior stroke/TIA, and no history of aortic aneurysm.        The patient states that she knows about the "Therapeutic Lifestyle Change" diet.  Her compliance with the TLC diet is fair.  The patient expresses understanding of adjunctive measures for cholesterol lowering.  Adjunctive measures started by the patient include aerobic exercise, fiber, ASA, limit alcohol consumpton, and weight reduction.  She expresses no side effects from her lipid-lowering medication.  The patient denies any symptoms to suggest myopathy or liver disease.      Current Medications (verified): 1)  Prednisolone Acetate 1 % Susp (Prednisolone Acetate) 2)  Dorzolamide-Timolol 2-0.5 % Soln (Dorzolamide-Timolol) .Marland Kitchen.. 1drop Each Eye Once Daily 3)  Famciclovir 500 Mg Tabs (Famciclovir) 4)  Asa 81mg  5)  Ibuprofen 200 Mg Caps (Ibuprofen) 6)  Multivitamin 7)  Microgestin 1.5/30 1.5-30 Mg-Mcg Tabs (Norethindrone Acet-Ethinyl Est) 8)  Synthroid 125 Mcg Tabs (Levothyroxine Sodium) .... One By Mouth Once Daily 9)  Calcium 600 +d 10)  Iron 65mg  11)  Crestor 20 Mg Tabs (Rosuvastatin Calcium) .... One By Mouth Once Daily For Cholesterol 12)  Valturna 150-160 Mg Tabs (Aliskiren-Valsartan) .Marland KitchenMarland KitchenMarland Kitchen  One By Mouth Once Daily For High Blood Pressure  Allergies (verified): 1)  ! * Mycins  Past History:  Past Medical History: Last updated: 06/04/2008 GERD Headache Hyperlipidemia Hypertension Osteoarthritis  Past Surgical History: Last updated: 06/04/2008 Lumpectomy  Family History: Last updated: 06/04/2008 Family History of  Alcoholism/Addiction Family History of Arthritis Family History Diabetes 1st degree relative Family History High cholesterol Family History Hypertension Family History of Stroke F 1st degree relative <60  Social History: Last updated: 06/04/2008 Married Alcohol use-no Drug use-no Regular exercise-yes Unemployed  Risk Factors: Alcohol Use: <1 (11/07/2009) >5 drinks/d w/in last 3 months: no (11/07/2009) Exercise: yes (06/04/2008)  Risk Factors: Smoking Status: quit > 6 months (11/07/2009) Passive Smoke Exposure: no (11/07/2009)  Family History: Reviewed history from 06/04/2008 and no changes required. Family History of Alcoholism/Addiction Family History of Arthritis Family History Diabetes 1st degree relative Family History High cholesterol Family History Hypertension Family History of Stroke F 1st degree relative <60  Social History: Reviewed history from 06/04/2008 and no changes required. Married Alcohol use-no Drug use-no Regular exercise-yes Unemployed  Review of Systems       The patient complains of weight gain.  The patient denies anorexia, fever, weight loss, chest pain, syncope, dyspnea on exertion, peripheral edema, prolonged cough, headaches, hemoptysis, abdominal pain, hematuria, suspicious skin lesions, transient blindness, difficulty walking, and enlarged lymph nodes.    Physical Exam  General:  alert, well-developed, well-nourished, well-hydrated, appropriate dress, normal appearance, healthy-appearing, and overweight-appearing.   Head:  normocephalic and atraumatic.   Mouth:  Oral mucosa and oropharynx without lesions or exudates.  Teeth in good repair. Neck:  supple, full ROM, no masses, no thyromegaly, no thyroid nodules or tenderness, no carotid bruits, and no cervical lymphadenopathy.   Lungs:  Normal respiratory effort, chest expands symmetrically. Lungs are clear to auscultation, no crackles or wheezes. Heart:  Normal rate and regular rhythm.  S1 and S2 normal without gallop, murmur, click, rub or other extra sounds. Abdomen:  soft, non-tender, normal bowel sounds, no distention, no masses, no guarding, no rigidity, no rebound tenderness, no hepatomegaly, and no splenomegaly.   Msk:  normal ROM, no joint tenderness, and no joint swelling.   Pulses:  R and L carotid,radial,femoral,dorsalis pedis and posterior tibial pulses are full and equal bilaterally Extremities:  No clubbing, cyanosis, edema, or deformity noted with normal full range of motion of all joints.   Neurologic:  No cranial nerve deficits noted. Station and gait are normal. Plantar reflexes are down-going bilaterally. DTRs are symmetrical throughout. Sensory, motor and coordinative functions appear intact. Skin:  turgor normal, color normal, no rashes, no suspicious lesions, no ecchymoses, no petechiae, no ulcerations, and no edema.   Cervical Nodes:  no anterior cervical adenopathy and no posterior cervical adenopathy.   Psych:  Cognition and judgment appear intact. Alert and cooperative with normal attention span and concentration. No apparent delusions, illusions, hallucinations   Impression & Recommendations:  Problem # 1:  PARESTHESIA (ICD-782.0) Assessment Improved  Problem # 2:  DYSMETABOLIC SYNDROME (ICD-277.7) Assessment: Unchanged  Problem # 3:  HYPOTHYROIDISM (ICD-244.9) Assessment: Improved  Her updated medication list for this problem includes:    Synthroid 125 Mcg Tabs (Levothyroxine sodium) ..... One by mouth once daily  Problem # 4:  HYPERTENSION (ICD-401.9) Assessment: Improved  Her updated medication list for this problem includes:    Valturna 150-160 Mg Tabs (Aliskiren-valsartan) ..... One by mouth once daily for high blood pressure  BP today: 122/78 Prior BP: 114/80 (12/12/2009)  Prior  10 Yr Risk Heart Disease: Not enough information (10/01/2008)  Labs Reviewed: K+: 4.3 (11/07/2009) Creat: : 1.0 (11/07/2009)   Chol: 253 (07/18/2009)    HDL: 54.20 (07/18/2009)   LDL: DEL (06/18/2008)   TG: 143.0 (07/18/2009)  Problem # 5:  HYPERLIPIDEMIA (ICD-272.4) Assessment: Unchanged  Her updated medication list for this problem includes:    Crestor 20 Mg Tabs (Rosuvastatin calcium) ..... One by mouth once daily for cholesterol  Labs Reviewed: SGOT: 22 (11/07/2009)   SGPT: 25 (11/07/2009)  Lipid Goals: Chol Goal: 200 (01/10/2010)   HDL Goal: 40 (01/10/2010)   LDL Goal: 70 (01/10/2010)   TG Goal: 150 (01/10/2010)  Prior 10 Yr Risk Heart Disease: Not enough information (10/01/2008)   HDL:54.20 (07/18/2009), 38.5 (06/18/2008)  LDL:DEL (06/18/2008)  Chol:253 (07/18/2009), 224 (06/18/2008)  Trig:143.0 (07/18/2009), 127 (06/18/2008)  Problem # 6:  COCCYGEAL PAIN (ICD-724.79) Assessment: Unchanged  Complete Medication List: 1)  Prednisolone Acetate 1 % Susp (Prednisolone acetate) 2)  Dorzolamide-timolol 2-0.5 % Soln (Dorzolamide-timolol) .Marland Kitchen.. 1drop each eye once daily 3)  Famciclovir 500 Mg Tabs (Famciclovir) 4)  Asa 81mg   5)  Ibuprofen 200 Mg Caps (Ibuprofen) 6)  Multivitamin  7)  Microgestin 1.5/30 1.5-30 Mg-mcg Tabs (Norethindrone acet-ethinyl est) 8)  Synthroid 125 Mcg Tabs (Levothyroxine sodium) .... One by mouth once daily 9)  Calcium 600 +d  10)  Iron 65mg   11)  Crestor 20 Mg Tabs (Rosuvastatin calcium) .... One by mouth once daily for cholesterol 12)  Valturna 150-160 Mg Tabs (Aliskiren-valsartan) .... One by mouth once daily for high blood pressure  Hypertension Assessment/Plan:      The patient's hypertensive risk group is category C: Target organ damage and/or diabetes.  Today's blood pressure is 122/78.  Her blood pressure goal is < 130/80.  Lipid Assessment/Plan:      Based on NCEP/ATP III, the patient's risk factor category is "history of coronary disease, peripheral vascular disease, cerebrovascular disease, or aortic aneurysm along with either diabetes, current smoker, or LDL > 130 plus HDL < 40 plus triglycerides  > 200".  The patient's lipid goals are as follows: Total cholesterol goal is 200; LDL cholesterol goal is 70; HDL cholesterol goal is 40; Triglyceride goal is 150.    Patient Instructions: 1)  Please schedule a follow-up appointment in 4 months. 2)  It is important that you exercise regularly at least 20 minutes 5 times a week. If you develop chest pain, have severe difficulty breathing, or feel very tired , stop exercising immediately and seek medical attention. 3)  You need to lose weight. Consider a lower calorie diet and regular exercise.  4)  Check your Blood Pressure regularly. If it is above 130/80 you should make an appointment. Prescriptions: VALTURNA 150-160 MG TABS (ALISKIREN-VALSARTAN) One by mouth once daily for high blood pressure  #90 x 3   Entered and Authorized by:   Adriana Grandchild MD   Signed by:   Adriana Grandchild MD on 01/10/2010   Method used:   Printed then faxed to ...       [facility] (mail-order)             , [place]         Ph: [phone]       Fax: [phone]   RxID:   [id number] CRESTOR 20 MG TABS (ROSUVASTATIN CALCIUM) One by mouth once daily for cholesterol  #90 x 3   Entered and Authorized by:   Adriana Grandchild MD   Signed by:  Adriana Grandchild MD on 01/10/2010   Method used:   Printed then faxed to ...       [facility] (mail-order)             , [place]         Ph: [phone]       Fax: [phone]   RxID:   [id number]

## 2010-06-26 NOTE — Miscellaneous (Signed)
Summary: Orders Update  Clinical Lists Changes  Orders: Added new Test order of Arterial Duplex Lower Extremity (Arterial Duplex Low) - Signed 

## 2010-07-14 ENCOUNTER — Telehealth: Payer: Self-pay | Admitting: Internal Medicine

## 2010-07-22 ENCOUNTER — Encounter: Payer: Self-pay | Admitting: Internal Medicine

## 2010-07-22 ENCOUNTER — Ambulatory Visit (INDEPENDENT_AMBULATORY_CARE_PROVIDER_SITE_OTHER): Payer: Managed Care, Other (non HMO) | Admitting: Internal Medicine

## 2010-07-22 ENCOUNTER — Other Ambulatory Visit: Payer: Self-pay | Admitting: Internal Medicine

## 2010-07-22 ENCOUNTER — Other Ambulatory Visit: Payer: Managed Care, Other (non HMO)

## 2010-07-22 DIAGNOSIS — I10 Essential (primary) hypertension: Secondary | ICD-10-CM

## 2010-07-22 DIAGNOSIS — R7309 Other abnormal glucose: Secondary | ICD-10-CM

## 2010-07-22 DIAGNOSIS — E039 Hypothyroidism, unspecified: Secondary | ICD-10-CM

## 2010-07-22 DIAGNOSIS — M1990 Unspecified osteoarthritis, unspecified site: Secondary | ICD-10-CM

## 2010-07-22 DIAGNOSIS — K219 Gastro-esophageal reflux disease without esophagitis: Secondary | ICD-10-CM

## 2010-07-22 DIAGNOSIS — E785 Hyperlipidemia, unspecified: Secondary | ICD-10-CM

## 2010-07-22 DIAGNOSIS — E8881 Metabolic syndrome: Secondary | ICD-10-CM

## 2010-07-22 DIAGNOSIS — R209 Unspecified disturbances of skin sensation: Secondary | ICD-10-CM

## 2010-07-22 DIAGNOSIS — Z79899 Other long term (current) drug therapy: Secondary | ICD-10-CM

## 2010-07-22 DIAGNOSIS — R1011 Right upper quadrant pain: Secondary | ICD-10-CM

## 2010-07-22 DIAGNOSIS — N390 Urinary tract infection, site not specified: Secondary | ICD-10-CM

## 2010-07-22 LAB — AMYLASE: Amylase: 110 U/L (ref 27–131)

## 2010-07-22 LAB — CBC WITH DIFFERENTIAL/PLATELET
Basophils Relative: 0.7 % (ref 0.0–3.0)
Eosinophils Relative: 0.9 % (ref 0.0–5.0)
HCT: 43.5 % (ref 36.0–46.0)
Hemoglobin: 15.4 g/dL — ABNORMAL HIGH (ref 12.0–15.0)
Lymphs Abs: 2 10*3/uL (ref 0.7–4.0)
MCV: 92.3 fl (ref 78.0–100.0)
Monocytes Absolute: 0.3 10*3/uL (ref 0.1–1.0)
Monocytes Relative: 5.3 % (ref 3.0–12.0)
Neutro Abs: 4 10*3/uL (ref 1.4–7.7)
RBC: 4.71 Mil/uL (ref 3.87–5.11)
WBC: 6.4 10*3/uL (ref 4.5–10.5)

## 2010-07-22 LAB — URINALYSIS, ROUTINE W REFLEX MICROSCOPIC
Ketones, ur: NEGATIVE
Specific Gravity, Urine: 1.025 (ref 1.000–1.030)
Total Protein, Urine: NEGATIVE
Urine Glucose: NEGATIVE
Urobilinogen, UA: 0.2 (ref 0.0–1.0)

## 2010-07-22 LAB — BASIC METABOLIC PANEL
BUN: 18 mg/dL (ref 6–23)
Chloride: 104 mEq/L (ref 96–112)
GFR: 59.59 mL/min — ABNORMAL LOW (ref >60.00)
Potassium: 4.5 mEq/L (ref 3.5–5.1)
Sodium: 137 mEq/L (ref 135–145)

## 2010-07-22 LAB — TSH: TSH: 2.52 u[IU]/mL (ref 0.35–5.50)

## 2010-07-22 LAB — HEPATIC FUNCTION PANEL
ALT: 24 U/L (ref 0–35)
AST: 23 U/L (ref 0–37)
Total Protein: 7.2 g/dL (ref 6.0–8.3)

## 2010-07-22 LAB — HEMOGLOBIN A1C: Hgb A1c MFr Bld: 6.1 % (ref 4.6–6.5)

## 2010-07-22 LAB — LIPID PANEL
Cholesterol: 159 mg/dL (ref 0–200)
LDL Cholesterol: 97 mg/dL (ref 0–99)
Total CHOL/HDL Ratio: 4

## 2010-07-22 NOTE — Progress Notes (Signed)
Summary: PA Crestor  Phone Note Outgoing Call   Call placed by: Rock Nephew CMA,  July 14, 2010 3:36 PM Call placed to: Patient Summary of Call: Received fax from pharmacy [facility] that Crestor is no longer covered w/o a PA (altern is LIpitor). Called patient to ask if she has tried Lipitor, she stated no. Sample of crestor 20mg  given to last until appt next week until this can be discussed.Marland KitchenMarland KitchenAlvy Beal Archie CMA  July 14, 2010 3:39 PM  Initial call taken by: Etta Grandchild MD,  July 18, 2010 12:39 PM  Follow-up for Phone Call        Dr Yetta Barre, please discuss cholesterol med w/pt at office visit on 2/28. Lipitor generic is preferred before they will consider covering crestor. THANKS Follow-up by: Lamar Sprinkles, CMA,  July 18, 2010 12:18 PM  Additional Follow-up for Phone Call Additional follow up Details #1::        left detailed vm on pt's hm # Additional Follow-up by: Lamar Sprinkles, CMA,  July 18, 2010 3:42 PM    New/Updated Medications: ATORVASTATIN CALCIUM 80 MG TABS (ATORVASTATIN CALCIUM) One by mouth once daily for cholesterol Prescriptions: ATORVASTATIN CALCIUM 80 MG TABS (ATORVASTATIN CALCIUM) One by mouth once daily for cholesterol  #30 x 11   Entered and Authorized by:   Etta Grandchild MD   Signed by:   Etta Grandchild MD on 07/18/2010   Method used:   Electronically to        [facility]  [address] [id number] (retail)       [address]       [place], [place]  [address]       Ph: [phone] or [phone]       Fax: [phone]   RxID:   [id number]

## 2010-07-24 ENCOUNTER — Other Ambulatory Visit: Payer: Self-pay | Admitting: Internal Medicine

## 2010-07-24 DIAGNOSIS — R1011 Right upper quadrant pain: Secondary | ICD-10-CM

## 2010-07-30 ENCOUNTER — Ambulatory Visit
Admission: RE | Admit: 2010-07-30 | Discharge: 2010-07-30 | Disposition: A | Discharge status: Home / Self Care | Payer: Managed Care, Other (non HMO) | Source: Ambulatory Visit | Attending: Internal Medicine | Admitting: Internal Medicine

## 2010-07-30 DIAGNOSIS — R1011 Right upper quadrant pain: Secondary | ICD-10-CM

## 2010-07-31 NOTE — Letter (Signed)
Summary: Lipid Letter  [facility]  [address] [place], [place] [address]   Phone: [phone]  Fax: [phone]    07/22/2010  Adriana Young [address] [place], [place]  [address]  Dear Adriana Young:  We have carefully reviewed your last lipid profile from 07/22/2010 and the results are noted below with a summary of recommendations for lipid management.    Cholesterol:       159     Goal: <200   HDL "good" Cholesterol:   51.88     Goal: >40   LDL "bad" Cholesterol:   97     Goal: <70   Triglycerides:       111.0     Goal: <150    your other labs look pretty good but your blood sugars are creeping up!    TLC Diet (Therapeutic Lifestyle Change): Saturated Fats & Transfatty acids should be kept < 7% of total calories ***Reduce Saturated Fats Polyunstaurated Fat can be up to 10% of total calories Monounsaturated Fat Fat can be up to 20% of total calories Total Fat should be no greater than 25-35% of total calories Carbohydrates should be 50-60% of total calories Protein should be approximately 15% of total calories Fiber should be at least 20-30 grams a day ***Increased fiber may help lower LDL Total Cholesterol should be < 200mg /day Consider adding plant stanol/sterols to diet (example: Benacol spread) ***A higher intake of unsaturated fat may reduce Triglycerides and Increase HDL    Adjunctive Measures (may lower LIPIDS and reduce risk of Heart Attack) include: Aerobic Exercise (20-30 minutes 3-4 times a week) Limit Alcohol Consumption Weight Reduction Aspirin 75-81 mg a day by mouth (if not allergic or contraindicated) Dietary Fiber 20-30 grams a day by mouth     Current Medications: 1)    Prednisolone Acetate 1 % Susp (Prednisolone acetate) 2)    Dorzolamide-timolol 2-0.5 % Soln (Dorzolamide-timolol) .Marland Kitchen.. 1drop each eye once daily 3)    Famciclovir 500 Mg Tabs (Famciclovir) 4)    Asa 81mg   5)    Ibuprofen 200 Mg Caps (Ibuprofen) 6)    Multivitamin  7)     Microgestin 1.5/30 1.5-30 Mg-mcg Tabs (Norethindrone acet-ethinyl est) 8)    Synthroid 125 Mcg Tabs (Levothyroxine sodium) .... One by mouth once daily 9)    Calcium 600 +d  10)    Iron 65mg   11)    Atorvastatin Calcium 80 Mg Tabs (Atorvastatin calcium) .... One by mouth once daily for cholesterol 12)    Twynsta 80-5 Mg Tabs (Telmisartan-amlodipine) .... One by mouth once daily for high blood pressure  If you have any questions, please call. We appreciate being able to work with you.   Sincerely,    [facility] Etta Grandchild MD

## 2010-07-31 NOTE — Assessment & Plan Note (Signed)
Summary: cpx/--will come fasting, labs after   Vital Signs:  Patient profile:   50 year old female Menstrual status:  regular LMP:     07/22/2010 Height:      69 inches Weight:      226 pounds BMI:     33.50 O2 Sat:      97 % on Room air Temp:     98.6 degrees F oral Pulse rate:   74 / minute Pulse rhythm:   regular Resp:     16 per minute BP sitting:   140 / 82  (left arm) Cuff size:   large  Vitals Entered By: Rock Nephew CMA (July 22, 2010 8:38 AM)  Nutrition Counseling: Patient's BMI is greater than 25 and therefore counseled on weight management options.  O2 Flow:  Room air CC: RUQ abd pain, Abdominal pain, Hypertension Management, Lipid Management Is Patient Diabetic? No Pain Assessment Patient in pain? no      LMP (date): 07/22/2010 LMP - Character: normal LMP - Reliable? Yes     Enter LMP: 07/22/2010 Last PAP Result Normal   Primary Care Provider:  Etta Grandchild MD  CC:  RUQ abd pain, Abdominal pain, Hypertension Management, and Lipid Management.  History of Present Illness:  Abdominal Pain      This is a 50 year old woman who presents with Abdominal pain.  The symptoms began 4-6 months ago.  On a scale of 1 to 10, the intensity is described as a 2.  The patient denies nausea, vomiting, diarrhea, constipation, melena, hematochezia, anorexia, and hematemesis.  The location of the pain is right upper quadrant.  The pain is described as intermittent and sharp.  The patient denies the following symptoms: fever, weight loss, dysuria, chest pain, jaundice, dark urine, missed menstrual period, and vaginal bleeding.  The pain is worse with food.    Hypertension History:      She denies headache, chest pain, palpitations, dyspnea with exertion, orthopnea, PND, peripheral edema, visual symptoms, neurologic problems, syncope, and side effects from treatment.        Positive major cardiovascular risk factors include diabetes, hyperlipidemia, and hypertension.   Negative major cardiovascular risk factors include female age less than 78 years old, negative family history for ischemic heart disease, and non-tobacco-user status.        Positive history for target organ damage include peripheral vascular disease.  Further assessment for target organ damage reveals no history of ASHD, cardiac end-organ damage (CHF/LVH), stroke/TIA, renal insufficiency, or hypertensive retinopathy.    Lipid Management History:      Positive NCEP/ATP III risk factors include diabetes, hypertension, and peripheral vascular disease.  Negative NCEP/ATP III risk factors include female age less than 30 years old, no history of early menopause without estrogen hormone replacement, no family history for ischemic heart disease, non-tobacco-user status, no ASHD (atherosclerotic heart disease), no prior stroke/TIA, and no history of aortic aneurysm.        The patient states that she knows about the "Therapeutic Lifestyle Change" diet.  Her compliance with the TLC diet is fair.  The patient expresses understanding of adjunctive measures for cholesterol lowering.  Adjunctive measures started by the patient include aerobic exercise, fiber, ASA, omega-3 supplements, sitostanol esters, limit alcohol consumpton, and weight reduction.  She expresses no side effects from her lipid-lowering medication.  The patient denies any symptoms to suggest myopathy or liver disease.      Preventive Screening-Counseling & Management  Alcohol-Tobacco  Alcohol drinks/day: <1     Alcohol type: wine     >5/day in last 3 mos: no     Alcohol Counseling: not indicated; use of alcohol is not excessive or problematic     Feels need to cut down: no     Feels annoyed by complaints: no     Feels guilty re: drinking: no     Needs 'eye opener' in am: no     Smoking Status: quit > 6 months     Year Quit: 2002     Pack years: 13     Passive Smoke Exposure: no     Tobacco Counseling: to remain off tobacco  products  Hep-HIV-STD-Contraception     Hepatitis Risk: no risk noted     HIV Risk: no     STD Risk: no risk noted      Sexual History:  currently monogamous.        Drug Use:  no.        Blood Transfusions:  no.    Clinical Review Panels:  Prevention   Last Mammogram:  Normal Bilateral (03/04/2010)   Last Pap Smear:  Normal (02/26/2009)  Lipid Management   Cholesterol:  253 (07/18/2009)   LDL (bad choesterol):  DEL (06/18/2008)   HDL (good cholesterol):  54.20 (07/18/2009)  Diabetes Management   HgBA1C:  5.9 (11/07/2009)   Creatinine:  1.0 (11/07/2009)  CBC   WBC:  5.0 (11/07/2009)   RBC:  4.79 (11/07/2009)   Hgb:  15.2 (11/07/2009)   Hct:  42.9 (11/07/2009)   Platelets:  223.0 (11/07/2009)   MCV  89.5 (11/07/2009)   MCHC  35.4 (11/07/2009)   RDW  14.2 (11/07/2009)   PMN:  50.8 (11/07/2009)   Lymphs:  39.6 (11/07/2009)   Monos:  6.1 (11/07/2009)   Eosinophils:  2.0 (11/07/2009)   Basophil:  1.5 (11/07/2009)  Complete Metabolic Panel   Glucose:  94 (11/07/2009)   Sodium:  138 (11/07/2009)   Potassium:  4.3 (11/07/2009)   Chloride:  104 (11/07/2009)   CO2:  27 (11/07/2009)   BUN:  13 (11/07/2009)   Creatinine:  1.0 (11/07/2009)   Albumin:  4.0 (11/07/2009)   Total Protein:  6.6 (11/07/2009)   Calcium:  9.3 (11/07/2009)   Total Bili:  0.5 (11/07/2009)   Alk Phos:  41 (11/07/2009)   SGPT (ALT):  25 (11/07/2009)   SGOT (AST):  22 (11/07/2009)   Medications Prior to Update: 1)  Prednisolone Acetate 1 % Susp (Prednisolone Acetate) 2)  Dorzolamide-Timolol 2-0.5 % Soln (Dorzolamide-Timolol) .Marland Kitchen.. 1drop Each Eye Once Daily 3)  Famciclovir 500 Mg Tabs (Famciclovir) 4)  Asa 81mg  5)  Ibuprofen 200 Mg Caps (Ibuprofen) 6)  Multivitamin 7)  Microgestin 1.5/30 1.5-30 Mg-Mcg Tabs (Norethindrone Acet-Ethinyl Est) 8)  Synthroid 125 Mcg Tabs (Levothyroxine Sodium) .... One By Mouth Once Daily 9)  Calcium 600 +d 10)  Iron 65mg  11)  Valturna 150-160 Mg Tabs  (Aliskiren-Valsartan) .... One By Mouth Once Daily For High Blood Pressure 12)  Atorvastatin Calcium 80 Mg Tabs (Atorvastatin Calcium) .... One By Mouth Once Daily For Cholesterol  Current Medications (verified): 1)  Prednisolone Acetate 1 % Susp (Prednisolone Acetate) 2)  Dorzolamide-Timolol 2-0.5 % Soln (Dorzolamide-Timolol) .Marland Kitchen.. 1drop Each Eye Once Daily 3)  Famciclovir 500 Mg Tabs (Famciclovir) 4)  Asa 81mg  5)  Ibuprofen 200 Mg Caps (Ibuprofen) 6)  Multivitamin 7)  Microgestin 1.5/30 1.5-30 Mg-Mcg Tabs (Norethindrone Acet-Ethinyl Est) 8)  Synthroid 125 Mcg Tabs (Levothyroxine Sodium) .... One By Mouth  Once Daily 9)  Calcium 600 +d 10)  Iron 65mg  11)  Atorvastatin Calcium 80 Mg Tabs (Atorvastatin Calcium) .... One By Mouth Once Daily For Cholesterol 12)  Twynsta 80-5 Mg Tabs (Telmisartan-Amlodipine) .... One By Mouth Once Daily For High Blood Pressure  Allergies (verified): 1)  ! * Mycins  Past History:  Past Medical History: Last updated: 06/04/2008 GERD Headache Hyperlipidemia Hypertension Osteoarthritis  Past Surgical History: Last updated: 06/04/2008 Lumpectomy  Family History: Last updated: 06/04/2008 Family History of Alcoholism/Addiction Family History of Arthritis Family History Diabetes 1st degree relative Family History High cholesterol Family History Hypertension Family History of Stroke F 1st degree relative <60  Social History: Last updated: 06/04/2008 Married Alcohol use-no Drug use-no Regular exercise-yes Unemployed  Risk Factors: Alcohol Use: <1 (07/22/2010) >5 drinks/d w/in last 3 months: no (07/22/2010) Exercise: yes (06/04/2008)  Risk Factors: Smoking Status: quit > 6 months (07/22/2010) Passive Smoke Exposure: no (07/22/2010)  Family History: Reviewed history from 06/04/2008 and no changes required. Family History of Alcoholism/Addiction Family History of Arthritis Family History Diabetes 1st degree relative Family History High  cholesterol Family History Hypertension Family History of Stroke F 1st degree relative <60  Social History: Reviewed history from 06/04/2008 and no changes required. Married Alcohol use-no Drug use-no Regular exercise-yes UnemployedHepatitis Risk:  no risk noted STD Risk:  no risk noted Sexual History:  currently monogamous Blood Transfusions:  no  Review of Systems       The patient complains of weight gain and abdominal pain.  The patient denies anorexia, fever, weight loss, chest pain, syncope, dyspnea on exertion, peripheral edema, prolonged cough, headaches, hemoptysis, melena, hematochezia, severe indigestion/heartburn, hematuria, muscle weakness, suspicious skin lesions, transient blindness, difficulty walking, depression, enlarged lymph nodes, angioedema, and breast masses.   Neuro:  Denies brief paralysis, difficulty with concentration, disturbances in coordination, inability to speak, memory loss, numbness, poor balance, tingling, tremors, visual disturbances, and weakness. Endo:  Denies cold intolerance, excessive hunger, excessive thirst, excessive urination, heat intolerance, polyuria, and weight change.  Physical Exam  General:  alert, well-developed, well-nourished, well-hydrated, appropriate dress, normal appearance, healthy-appearing, and overweight-appearing.   Head:  normocephalic and atraumatic.   Mouth:  Oral mucosa and oropharynx without lesions or exudates.  Teeth in good repair. Neck:  supple, full ROM, no masses, no thyromegaly, no thyroid nodules or tenderness, no carotid bruits, and no cervical lymphadenopathy.   Lungs:  Normal respiratory effort, chest expands symmetrically. Lungs are clear to auscultation, no crackles or wheezes. Heart:  Normal rate and regular rhythm. S1 and S2 normal without gallop, murmur, click, rub or other extra sounds. Abdomen:  soft, non-tender, normal bowel sounds, no distention, no masses, no guarding, no rigidity, no rebound  tenderness, no hepatomegaly, and no splenomegaly.   Rectal:  No external abnormalities noted. Normal sphincter tone. No rectal masses or tenderness. Msk:  normal ROM, no joint tenderness, and no joint swelling.   Pulses:  R and L carotid,radial,femoral,dorsalis pedis and posterior tibial pulses are full and equal bilaterally Extremities:  No clubbing, cyanosis, edema, or deformity noted with normal full range of motion of all joints.   Neurologic:  No cranial nerve deficits noted. Station and gait are normal. Plantar reflexes are down-going bilaterally. DTRs are symmetrical throughout. Sensory, motor and coordinative functions appear intact. Skin:  turgor normal, color normal, no rashes, no suspicious lesions, no ecchymoses, no petechiae, no ulcerations, and no edema.   Cervical Nodes:  no anterior cervical adenopathy and no posterior cervical adenopathy.   Axillary  Nodes:  no R axillary adenopathy and no L axillary adenopathy.   Inguinal Nodes:  no R inguinal adenopathy and no L inguinal adenopathy.   Psych:  Cognition and judgment appear intact. Alert and cooperative with normal attention span and concentration. No apparent delusions, illusions, hallucinations   Impression & Recommendations:  Problem # 1:  ABDOMINAL PAIN, RIGHT UPPER QUADRANT (ICD-789.01) Assessment New this sounds like gallstones so will check labs and an u/s Orders: Venipuncture (19147) TLB-Lipid Panel (80061-LIPID) TLB-BMP (Basic Metabolic Panel-BMET) (80048-METABOL) TLB-CBC Platelet - w/Differential (85025-CBCD) TLB-Hepatic/Liver Function Pnl (80076-HEPATIC) TLB-TSH (Thyroid Stimulating Hormone) (84443-TSH) TLB-Amylase (82150-AMYL) TLB-Lipase (83690-LIPASE) TLB-Udip w/ Micro (81001-URINE) TLB-A1C / Hgb A1C (Glycohemoglobin) (83036-A1C) Radiology Referral (Radiology) Hemoccult Guaiac-1 spec.(in office) (82270)  Problem # 2:  HYPOTHYROIDISM (ICD-244.9) Assessment: Unchanged  Her updated medication list for this  problem includes:    Synthroid 125 Mcg Tabs (Levothyroxine sodium) ..... One by mouth once daily  Orders: Venipuncture (82956) TLB-Lipid Panel (80061-LIPID) TLB-BMP (Basic Metabolic Panel-BMET) (80048-METABOL) TLB-CBC Platelet - w/Differential (85025-CBCD) TLB-Hepatic/Liver Function Pnl (80076-HEPATIC) TLB-TSH (Thyroid Stimulating Hormone) (84443-TSH) TLB-Amylase (82150-AMYL) TLB-Lipase (83690-LIPASE) TLB-Udip w/ Micro (81001-URINE) TLB-A1C / Hgb A1C (Glycohemoglobin) (83036-A1C)  Labs Reviewed: TSH: 1.00 (11/07/2009)   Total T4: 7.7 (07/26/2008)    HgBA1c: 5.9 (11/07/2009) Chol: 253 (07/18/2009)   HDL: 54.20 (07/18/2009)   LDL: DEL (06/18/2008)   TG: 143.0 (07/18/2009)  Problem # 3:  FASTING HYPERGLYCEMIA (ICD-790.29) Assessment: Unchanged  Orders: Venipuncture (21308) TLB-Lipid Panel (80061-LIPID) TLB-BMP (Basic Metabolic Panel-BMET) (80048-METABOL) TLB-CBC Platelet - w/Differential (85025-CBCD) TLB-Hepatic/Liver Function Pnl (80076-HEPATIC) TLB-TSH (Thyroid Stimulating Hormone) (84443-TSH) TLB-Amylase (82150-AMYL) TLB-Lipase (83690-LIPASE) TLB-Udip w/ Micro (81001-URINE) TLB-A1C / Hgb A1C (Glycohemoglobin) (83036-A1C)  Labs Reviewed: Creat: 1.0 (11/07/2009)     Problem # 4:  HYPERTENSION (ICD-401.9) Assessment: Unchanged  The following medications were removed from the medication list:    Valturna 150-160 Mg Tabs (Aliskiren-valsartan) ..... One by mouth once daily for high blood pressure Her updated medication list for this problem includes:    Twynsta 80-5 Mg Tabs (Telmisartan-amlodipine) ..... One by mouth once daily for high blood pressure  Orders: Venipuncture (65784) TLB-Lipid Panel (80061-LIPID) TLB-BMP (Basic Metabolic Panel-BMET) (80048-METABOL) TLB-CBC Platelet - w/Differential (85025-CBCD) TLB-Hepatic/Liver Function Pnl (80076-HEPATIC) TLB-TSH (Thyroid Stimulating Hormone) (84443-TSH) TLB-Amylase (82150-AMYL) TLB-Lipase (83690-LIPASE) TLB-Udip w/  Micro (81001-URINE) TLB-A1C / Hgb A1C (Glycohemoglobin) (83036-A1C)  BP today: 140/82 Prior BP: 122/78 (01/10/2010)  Prior 10 Yr Risk Heart Disease: Not enough information (10/01/2008)  Labs Reviewed: K+: 4.3 (11/07/2009) Creat: : 1.0 (11/07/2009)   Chol: 253 (07/18/2009)   HDL: 54.20 (07/18/2009)   LDL: DEL (06/18/2008)   TG: 143.0 (07/18/2009)  Problem # 5:  HYPERLIPIDEMIA (ICD-272.4) Assessment: Unchanged  Her updated medication list for this problem includes:    Atorvastatin Calcium 80 Mg Tabs (Atorvastatin calcium) ..... One by mouth once daily for cholesterol  Orders: Venipuncture (69629) TLB-Lipid Panel (80061-LIPID) TLB-BMP (Basic Metabolic Panel-BMET) (80048-METABOL) TLB-CBC Platelet - w/Differential (85025-CBCD) TLB-Hepatic/Liver Function Pnl (80076-HEPATIC) TLB-TSH (Thyroid Stimulating Hormone) (84443-TSH) TLB-Amylase (82150-AMYL) TLB-Lipase (83690-LIPASE) TLB-Udip w/ Micro (81001-URINE) TLB-A1C / Hgb A1C (Glycohemoglobin) (83036-A1C)  Problem # 6:  PARESTHESIA (ICD-782.0) Assessment: Improved  Complete Medication List: 1)  Prednisolone Acetate 1 % Susp (Prednisolone acetate) 2)  Dorzolamide-timolol 2-0.5 % Soln (Dorzolamide-timolol) .Marland Kitchen.. 1drop each eye once daily 3)  Famciclovir 500 Mg Tabs (Famciclovir) 4)  Asa 81mg   5)  Ibuprofen 200 Mg Caps (Ibuprofen) 6)  Multivitamin  7)  Microgestin 1.5/30 1.5-30 Mg-mcg Tabs (Norethindrone acet-ethinyl est) 8)  Synthroid 125 Mcg Tabs (Levothyroxine sodium) .... One  by mouth once daily 9)  Calcium 600 +d  10)  Iron 65mg   11)  Atorvastatin Calcium 80 Mg Tabs (Atorvastatin calcium) .... One by mouth once daily for cholesterol 12)  Twynsta 80-5 Mg Tabs (Telmisartan-amlodipine) .... One by mouth once daily for high blood pressure  Hypertension Assessment/Plan:      The patient's hypertensive risk group is category C: Target organ damage and/or diabetes.  Today's blood pressure is 140/82.  Her blood pressure goal is <  130/80.  Lipid Assessment/Plan:      Based on NCEP/ATP III, the patient's risk factor category is "history of coronary disease, peripheral vascular disease, cerebrovascular disease, or aortic aneurysm along with either diabetes, current smoker, or LDL > 130 plus HDL < 40 plus triglycerides > 200".  The patient's lipid goals are as follows: Total cholesterol goal is 200; LDL cholesterol goal is 70; HDL cholesterol goal is 40; Triglyceride goal is 150.    Colorectal Screening:  Current Recommendations:    Hemoccult: NEG X 1 today  PAP Screening:    Hx Cervical Dysplasia in last 5 yrs? No    3 normal PAP smears in last 5 yrs? Yes    Last PAP smear:  02/26/2009    Reviewed PAP smear recommendations:  patient defers to GYN provider  Mammogram Screening:    Last Mammogram:  03/04/2010    Reviewed Mammogram recommendations:  mammogram not due yet  Mammogram Results:    Date of Exam:  03/04/2010    Results:  Normal Bilateral  Osteoporosis Risk Assessment:  Risk Factors for Fracture or Low Bone Density:   Race (White or Asian):     yes   Smoking status:       quit > 6 months   Thyroid replacement:     yes   Thyroid disease:     yes  Patient Instructions: 1)  Please schedule a follow-up appointment in 2 months. 2)  It is important that you exercise regularly at least 20 minutes 5 times a week. If you develop chest pain, have severe difficulty breathing, or feel very tired , stop exercising immediately and seek medical attention. 3)  You need to lose weight. Consider a lower calorie diet and regular exercise.  4)  Schedule your mammogram. 5)  You need to have a Pap Smear to prevent cervical cancer. 6)  Check your Blood Pressure regularly. If it is above 130/80: you should make an appointment. Prescriptions: TWYNSTA 80-5 MG TABS (TELMISARTAN-AMLODIPINE) One by mouth once daily for high blood pressure  #70 x 0   Entered and Authorized by:   Etta Grandchild MD   Signed by:   Etta Grandchild MD on 07/22/2010   Method used:   Samples Given   RxID:   [id number]    Orders Added: 1)  Venipuncture [36415] 2)  TLB-Lipid Panel [80061-LIPID] 3)  TLB-BMP (Basic Metabolic Panel-BMET) [80048-METABOL] 4)  TLB-CBC Platelet - w/Differential [85025-CBCD] 5)  TLB-Hepatic/Liver Function Pnl [80076-HEPATIC] 6)  TLB-TSH (Thyroid Stimulating Hormone) [84443-TSH] 7)  TLB-Amylase [82150-AMYL] 8)  TLB-Lipase [83690-LIPASE] 9)  TLB-Udip w/ Micro [81001-URINE] 10)  TLB-A1C / Hgb A1C (Glycohemoglobin) [83036-A1C] 11)  Radiology Referral [Radiology] 12)  Hemoccult Guaiac-1 spec.(in office) [82270] 13)  Est. Patient Level V [57846]   Not Administered:    Influenza Vaccine not given due to: vaccine availability

## 2010-08-15 ENCOUNTER — Telehealth: Payer: Self-pay | Admitting: *Deleted

## 2010-08-15 MED ORDER — ATORVASTATIN CALCIUM 80 MG PO TABS: 80.0000 mg | ORAL_TABLET | Freq: Every day | ORAL | Status: DC

## 2010-08-15 NOTE — Telephone Encounter (Signed)
Patient requesting rf of Lipitor 80mg   to go to [facility], 90 day supply w/3rfs. OK?

## 2010-08-15 NOTE — Telephone Encounter (Signed)
yes

## 2010-08-15 NOTE — Telephone Encounter (Signed)
Rf sent........Marland Kitchenleft detailed mess informing pt

## 2010-08-18 ENCOUNTER — Other Ambulatory Visit: Payer: Self-pay

## 2010-08-18 MED ORDER — ATORVASTATIN CALCIUM 80 MG PO TABS: 80.0000 mg | ORAL_TABLET | Freq: Every day | ORAL | Status: DC

## 2010-09-19 ENCOUNTER — Encounter: Payer: Self-pay | Admitting: Internal Medicine

## 2010-09-19 ENCOUNTER — Ambulatory Visit (INDEPENDENT_AMBULATORY_CARE_PROVIDER_SITE_OTHER): Payer: Managed Care, Other (non HMO) | Admitting: Internal Medicine

## 2010-09-19 DIAGNOSIS — E039 Hypothyroidism, unspecified: Secondary | ICD-10-CM

## 2010-09-19 DIAGNOSIS — K760 Fatty (change of) liver, not elsewhere classified: Secondary | ICD-10-CM

## 2010-09-19 DIAGNOSIS — E785 Hyperlipidemia, unspecified: Secondary | ICD-10-CM

## 2010-09-19 DIAGNOSIS — K7689 Other specified diseases of liver: Secondary | ICD-10-CM

## 2010-09-19 DIAGNOSIS — IMO0001 Reserved for inherently not codable concepts without codable children: Secondary | ICD-10-CM

## 2010-09-19 DIAGNOSIS — I10 Essential (primary) hypertension: Secondary | ICD-10-CM

## 2010-09-19 HISTORY — DX: Reserved for inherently not codable concepts without codable children: IMO0001

## 2010-09-19 MED ORDER — TELMISARTAN-AMLODIPINE 80-5 MG PO TABS: 1.0000 | ORAL_TABLET | Freq: Every day | ORAL | Status: DC

## 2010-09-19 NOTE — Progress Notes (Signed)
Subjective:    Patient ID: Adriana Young, female    DOB: [birth year], 50 y.o.   MRN: [id number]  Diabetes She presents for her follow-up diabetic visit. She has type 2 diabetes mellitus. Her disease course has been improving. There are no hypoglycemic associated symptoms. Pertinent negatives for hypoglycemia include no confusion, dizziness, headaches, nervousness/anxiousness, pallor, seizures, speech difficulty, sweats or tremors. There are no diabetic associated symptoms. Pertinent negatives for diabetes include no blurred vision, no chest pain, no fatigue and no weakness. There are no hypoglycemic complications. Symptoms are stable. There are no diabetic complications. Current diabetic treatment includes diet. She is compliant with treatment all of the time. Her weight is stable. She is following a generally healthy diet. Meal planning includes avoidance of concentrated sweets. She participates in exercise daily. There is no change in her home blood glucose trend. An ACE inhibitor/angiotensin II receptor blocker is being taken. She does not see a podiatrist.Eye exam is not current.  Hypertension This is a chronic problem. The current episode started more than 1 year ago. The problem has been gradually improving since onset. The problem is controlled. Pertinent negatives include no anxiety, blurred vision, chest pain, headaches, malaise/fatigue, neck pain, orthopnea, palpitations, peripheral edema, PND, shortness of breath or sweats. There are no associated agents to hypertension. Risk factors for coronary artery disease include diabetes mellitus. Past treatments include angiotensin blockers and calcium channel blockers. The current treatment provides significant improvement. There are no compliance problems.       Review of Systems  Constitutional: Negative for fever, chills, malaise/fatigue, diaphoresis, activity change, appetite change, fatigue and unexpected weight change.  HENT: Negative for facial  swelling, neck pain and neck stiffness.   Eyes: Negative for blurred vision.  Respiratory: Negative for apnea, cough, choking, chest tightness, shortness of breath, wheezing and stridor.   Cardiovascular: Negative for chest pain, palpitations, orthopnea, leg swelling and PND.  Gastrointestinal: Negative for nausea, vomiting, abdominal pain, diarrhea, constipation, blood in stool and abdominal distention.  Genitourinary: Negative for dysuria, urgency, frequency, hematuria, flank pain, decreased urine volume, difficulty urinating and dyspareunia.  Musculoskeletal: Negative for myalgias, back pain, joint swelling, arthralgias and gait problem.  Skin: Negative for color change, pallor and rash.  Neurological: Negative for dizziness, tremors, seizures, syncope, facial asymmetry, speech difficulty, weakness, light-headedness, numbness and headaches.  Hematological: Negative for adenopathy. Does not bruise/bleed easily.  Psychiatric/Behavioral: Negative for suicidal ideas, hallucinations, behavioral problems, confusion, sleep disturbance, self-injury, dysphoric mood, decreased concentration and agitation. The patient is not nervous/anxious and is not hyperactive.        Objective:   Physical Exam  [vitalsreviewed. Constitutional: She is oriented to person, place, and time. She appears well-developed and well-nourished. No distress.  HENT:  Head: Normocephalic and atraumatic.  Right Ear: External ear normal.  Left Ear: External ear normal.  Nose: Nose normal.  Mouth/Throat: Oropharynx is clear and moist. No oropharyngeal exudate.  Eyes: Conjunctivae and EOM are normal. Pupils are equal, round, and reactive to light. Right eye exhibits no discharge. Left eye exhibits no discharge. No scleral icterus.  Neck: Normal range of motion. Neck supple. No JVD present. No tracheal deviation present. No thyromegaly present.  Cardiovascular: Normal rate, regular rhythm, normal heart sounds and intact distal  pulses.  Exam reveals no gallop and no friction rub.   No murmur heard. Pulmonary/Chest: Effort normal and breath sounds normal. No stridor. No respiratory distress. She has no wheezes. She has no rales. She exhibits no tenderness.  Abdominal: Soft. Bowel sounds  are normal. She exhibits no distension and no mass. There is no tenderness. There is no rebound and no guarding.  Musculoskeletal: Normal range of motion. She exhibits no tenderness.  Lymphadenopathy:    She has no cervical adenopathy.  Neurological: She is alert and oriented to person, place, and time. She has normal reflexes. No cranial nerve deficit. Coordination normal.  Skin: Skin is warm and dry. No rash noted. She is not diaphoretic. No erythema. No pallor.  Psychiatric: She has a normal mood and affect. Her behavior is normal. Judgment normal.        Lab Results  Component Value Date   WBC 6.4 07/22/2010   HGB 15.4* 07/22/2010   HCT 43.5 07/22/2010   PLT 229.0 07/22/2010   CHOL 159 07/22/2010   TRIG 111.0 07/22/2010   HDL 40.30 07/22/2010   LDLDIRECT 189.4 07/18/2009   ALT 24 07/22/2010   AST 23 07/22/2010   NA 137 07/22/2010   K 4.5 07/22/2010   CL 104 07/22/2010   CREATININE 1.0 07/22/2010   BUN 18 07/22/2010   CO2 25 07/22/2010   TSH 2.52 07/22/2010   HGBA1C 6.1 07/22/2010    Assessment & Plan:

## 2010-09-19 NOTE — Patient Instructions (Signed)
Diabetes, Type 2 Diabetes is a lasting (chronic) disease. In type 2 diabetes, the pancreas does not make enough insulin (a hormone), and the body does not respond normally to the insulin that is made. This type of diabetes was also previously called adult onset diabetes. About 90% of all those who have diabetes have type 2. It usually occurs after the age of 47 but can occur at any age. CAUSES Unlike type 1 diabetes, which happens because insulin is no longer being made, type 2 diabetes happens because the body is making less insulin and has trouble using the insulin properly. SYMPTOMS  Drinking more than usual.   Urinating more than usual.   Blurred vision.   Dry, itchy skin.   Frequent infection like yeast infections in women.   More tired than usual (fatigue).  TREATMENT  Healthy eating.   Exercise.   Medication, if needed.   Monitoring blood glucose (sugar).   Seeing your caregiver regularly.  HOME CARE INSTRUCTIONS  Check your blood glucose (sugar) at least once daily. More frequent monitoring may be necessary, depending on your medications and on how well your diabetes is controlled. Your caregiver will advise you.   Take your medicine as directed by your caregiver.   Do not smoke.   Make wise food choices. Ask your caregiver for information. Weight loss can improve your diabetes.   Learn about low blood glucose (hypoglycemia) and how to treat it.   Get your eyes checked regularly.   Have a yearly physical exam. Have your blood pressure checked. Get your blood and urine tested.   Wear a pendant or bracelet saying that you have diabetes.   Check your feet every night for sores. Let your caregiver know if you have sores that are not healing.  SEEK MEDICAL CARE IF:  You are having problems keeping your blood glucose at target range.   You feel you might be having problems with your medicines.   You have symptoms of an illness that is not improving after 24  hours.   You have a sore or wound that is not healing.   You notice a change in vision or a new problem with your vision.   You develop a fever of more than 100.5.  Document Released: 05/11/2005 Document Re-Released: 06/02/2009 [facility] Patient Information 2011 [place], [place].Fatty Liver Hepatosteatosis, Steatohepatitis Fatty liver is the accumulation of fat in liver cells. It is also called hepatosteatosis or steatohepatitis. It is normal for your liver to contain some fat. If fat is more than 5-10% of your liver's weight, you have fatty liver.  There are often no symptoms (problems) for years while damage is still occurring. People often learn about their fatty liver when they have medical tests for other reasons. Fat can damage your liver for years or even decades without causing problems. When it becomes severe, it can cause fatigue, weight loss, weakness, and confusion. This makes you more likely to develop more serious liver problems. The liver is the largest organ in the body. It does a lot of work and often gives no warning signs when it is sick until late in a disease. The liver has many important jobs including:  Breaking down foods.   Storing vitamins, iron, and other minerals.   Making proteins.   Making bile for food digestion.   Breaking down many products including medications, alcohol and some poisons.  CAUSES There are a number of different conditions, medications, and poisons that can cause a fatty liver.  Eating too many calories causes fat to build up in the liver. Not processing and breaking fats down normally may also cause this. Certain conditions, such as obesity, diabetes, and high triglycerides also cause this. Most fatty liver patients tend to be middle-aged and over weight.  Some causes of fatty liver are:  Alcohol over consumption.  Malnutrition.   Steroid use.   Valproic acid toxicity.   Obesity.  Cushing's syndrome.   Poisons.   Tetracycline in  high dosages.   Pregnancy.  Diabetes.   Hyperlipidemia.   Rapid weight loss.   Some people develop fatty liver even having none of these conditions. SYMPTOMS Fatty liver most often causes no problems. This is called asymptomatic.  It can be diagnosed with blood tests and also by a liver biopsy.   It is one of the most common causes of minor elevations of liver enzymes on routine blood tests.   Specialized Imaging of the liver using ultrasound, CT (computed tomography) scan, or MRI (magnetic resonance imaging) can suggest a fatty liver but a biopsy is needed to confirm it.   A biopsy involves taking a small sample of liver tissue. This is done by using a needle. It is then looked at under a microscope by a specialist.  TREATMENT  It is important to treat the cause. Simple fatty liver without a medical reason may not need treatment.  Weight loss, fat restriction, and exercise in overweight patients produces inconsistent results but is worth trying.   Fatty liver due to alcohol toxicity may not improve even with stopping drinking.   Good control of diabetes may reduce fatty liver.   Lower your triglycerides through diet, medication or both.   Eat a balanced, healthy diet.   Increase your physical activity.   Get regular checkups from a liver specialist.   There are no medical or surgical treatments for a fatty liver or NASH, but improving your diet and increasing your exercise may help prevent or reverse some of the damage.  PROGNOSIS Fatty liver may cause no damage or it can lead to an inflammation of the liver. This is, called steatohepatitis. When it is linked to alcohol abuse, it is called alcoholic steatohepatitis. It often is not linked to alcohol. It is then called nonalcoholic steatohepatitis, or NASH. Over time the liver may become scarred and hardened. This condition is called cirrhosis. Cirrhosis is serious and may lead to liver failure or cancer. NASH is one of the  leading causes of cirrhosis. About 10-20% of Americans have fatty liver and a smaller 2-5% has NASH. Much of this information is from the [employer]. Last reviewed by [facility] 06-25-05 Document Released: 06/26/2005 Document Re-Released: 08/07/2008 [facility] Patient Information 2011 [place], [place].

## 2010-09-19 NOTE — Assessment & Plan Note (Signed)
Her BP is well controlled 

## 2010-09-19 NOTE — Assessment & Plan Note (Signed)
She is euthyroid.

## 2010-09-19 NOTE — Assessment & Plan Note (Signed)
This was found on her u/s, her last set of LFT's were normal and she has no pain, she is working diligently on her weight loss program

## 2010-09-19 NOTE — Assessment & Plan Note (Signed)
Her A1C= 6.1 so no meds are needed, she will continue to work on her lifestyle issues, eye exam referral was done today

## 2010-09-19 NOTE — Assessment & Plan Note (Signed)
She is tolerating the statin well

## 2010-10-07 NOTE — Procedures (Signed)
NAME:  Adriana Young, Adriana Young                  ACCOUNT NO.:  [id number]   MEDICAL RECORD NO.:  [id number]          PATIENT TYPE:  REC   LOCATION:  [facility]                          FACILITY:  [facility]   PHYSICIAN:  Erick Colace, M.D.DATE OF BIRTH:  March 10, 1961   DATE OF PROCEDURE:  10/18/2008  DATE OF DISCHARGE:                               OPERATIVE REPORT   PROCEDURE:  Bilateral L5 dorsal ramus injection, bilateral L4 medial  branch block, and bilateral L3 medial branch block under fluoroscopic  guidance.   INDICATIONS:  Lumbar axial pain, extending to the sacral area, only  partially responsive to medication management and interfering with self-  care and interfering with mobility.   Informed consent was obtained after describing risks and benefits of the  procedure with the patient.  These include bleeding, bruising, and  infection.  She elects to proceed and has given written consent.  The  patient was placed prone on fluoroscopy table.  Betadine prep, sterile  drape.  A 25-gauge 1-1/2-inch needle was used to anesthetize the skin  and subcutaneous tissue with 1% lidocaine x2 mL, at each of 6 sites.  Then, a 22-gauge 3-1/2-inch spinal needle was inserted under  fluoroscopic guidance, targeting the left S1, SAP, sacroiliac junction.  Bone contact was made and confirmed with lateral imaging.  Omnipaque 180  x 0.5 mL demonstrated no intravascular uptake and 0.5 mL of  dexamethasone-lidocaine solution was injected.  This consisted of 1 mL  of 4 mg/mL dexamethasone and 2 mL of 2% MPF lidocaine.  The left L5,  SAP, transverse process junction was targeted.  Bone contact was made  and confirmed with lateral imaging.  Omnipaque 180 x 0.5 mL demonstrated  no intravascular uptake and 0.5 mL of dexamethasone-lidocaine solution  was injected in the left L4, SAP, transverse process junction was  targeted.  Bone contact was made and confirmed with lateral imaging.  Omnipaque 180 under live fluoro  demonstrated no intravascular uptake and  0.5 mL of dexamethasone-lidocaine solution was injected.  The same  procedure was repeated on the right side, using same needle injectate,  and technique.  The patient tolerated the procedure well.  Pre -and post-  injection vitals were stable.  Post-injection instructions were given.  We will check pre -and post-injection pain levels.  If no significant  difference, we will proceed on to a coccygeal injection under  fluoroscopic guidance.      Erick Colace, M.D.  Electronically Signed     AEK/MEDQ  D:  10/18/2008 10:14:21  T:  10/19/2008 00:26:51  Job:  [id number]

## 2010-10-07 NOTE — Consult Note (Signed)
CONSULTED REQUESTED BY:  Sanda Linger, MD, in regards to low back and  coccyx pain.   CHIEF COMPLAINT:  Low back coccyx pain as well as pain that goes into  the thighs and leg area.   A 50 year old female with an 59-month history of insidious onset low back  and coccyx pain.  She had no traumatic event.  She was doing a lot of  Pilates and other type of core exercises, but has been unable to do so  since the onset of this problem.  She has maintained her fitness with  swimming, stare mass her treadmill, but can no longer use a stationary  bike because of increased pain.  She has pain that goes down from the  back as well as into her thighs and then into her anterior leg area  bilaterally but sparing the feet.  She has morning pain that is some  numbness, tingling in the forearms that improves with time.  She has no  history of lumbar surgeries.  No history of carpal tunnel syndrome.   Her average pain is 8/10, described as sharp, burning, intermittent,  stabbing, tingling, and aching, interferes with activity at 6/10.  Sleep  is fair.  Pain is worse with bending, sitting, and activity.  It  improves with rest and certain exercises.  She can walk up to 30 minutes  at a time.  She can climb steps and drive.  She was last employed in May  2009.   REVIEW OF SYSTEMS:  Positive for numbness and tingling as noted above as  well as night sweats.   PAST MEDICAL HISTORY:  Hypothyroidism as well as arthritis.   SOCIAL HISTORY:  Married and lives with her husband as well as her  mother.  She drinks 1 drink of alcohol every 3-4 days.   FAMILY HISTORY:  Heart disease, diabetes, high blood pressure, alcohol  abuse.   CURRENT MEDICATIONS:  She takes ibuprofen.   All other past medical history is significant for glaucoma, sees Dr.  Cresenciano Genre for that.   PHYSICAL EXAMINATION:  Blood pressure 172/93, pulse 68, respirations 18,  O2 sat 100% on room air.  Well-developed, well-nourished female in  no  acute distress.  Orientation x3.  Affect alert.  Gait is normal.  Extremities without edema.  Coordination normal.  Deep tendon reflexes  are normal.  She has negative reverse Phalen's.  She has full range of  motion in the neck, 75% range in forward flexion and extension of the  lumbar spine accompanied by pain at the end range on both directions.  Pearlean Brownie test shows mild hip pain on the right, but negative over the SI  area.  Straight leg raising test is negative.  Motor strength is normal  upper and lower extremities, able to toe-walk and heel-walk.   IMPRESSION:  Lumbar pain, coccygeal pain, as well as pain that radiates  down into the thighs and legs.  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  Would suspect L4 distribution based on  complaints.   She can continue on her ibuprofen that she takes on a p.r.n. basis.  We  will check an MRI of the lumbar spine, which will further delineate  pathology.  Following that, I will see her back, review results,  consider epidural injections with physical therapy.   Other potential diagnosis in the differential would include sacroiliac  disorder.  I have given her information of all these.  I have also  showed  her pelvic tilt exercises and given her exercise program for  that.   Thank you for this interesting consultation.  I will keep you apprised  of her progress.      Erick Colace, M.D.  Electronically Signed     AEK/MedQ  D:08/27/2008 10:34:39  T:08/27/2008 16:10:96  Job #:  [id number]   cc:   Sanda Linger, MD  [address] [place] [address] [place] [address]

## 2010-10-07 NOTE — Assessment & Plan Note (Signed)
A 50 year old female that I saw initially in April complaining of  coccygeal pain as well as low back pain.  She has undergone MRI  scanning.  Demonstrating no significant nerve root encroachment.  There  was facet degenerative changes at L4-5 and L5-S1.  She underwent L3, L4,  L5 medial branch blocks and this basically resolved her back and  coccygeal pain.  In terms of her lower extremity pain, she had an EMG  and CT showed no evidence of nerve root impingement or peripheral  neuropathy.  Her leg pain has similarly improved.  Her overall pain is  2/10.  Her last injection was performed on Oct 18, 2008.  Pre-injection  6.  Post-injection 2.  She remains at a 2 level.  She is back to working  out.  She has difficulty doing sit-ups, but she does fine with treadmill  about 60 minutes at a time.  She climbs steps.  She drives.   PHYSICAL EXAMINATION:  GENERAL:  No acute distress.  Mood and affect  appropriate.  VITAL SIGNS:  Her blood pressure 154/72, pulse 84, respirations 80 and  O2 sat 90% on room air.  BACK:  No tenderness to palpation over pain over the coccyx area.  She  has full forward flexion, but extension of her spine causes some pain.  EXTREMITIES:  Lower extremity strength is full.  Lower extremity  sensation is normal.  Lower extremity range of motion is normal.   IMPRESSION:  Lumbar spondylosis.  She has facet-mediated pain with  prolonged response to medial branch block, as I had noted to the patient  difficult to predict how long her block is going to work, this has  certainly can be repeated, once it does wear off.  At this point, I  would not pursue radiofrequency neurotomy, given her prolonged response  to the medial branch block.  Her main complaint is difficulty sleeping  at night and this is due to some muscle spasm type pain.  We will start  some Flexeril 10 mg nightly.  She is really not taking any ibuprofen or  any other pain medicines at the current time.   I  will have her call me back on a p.r.n. basis, should her pain recur.      Erick Colace, M.D.  Electronically Signed     AEK/MedQ  D:  11/29/2008 12:41:31  T:  11/30/2008 00:44:41  Job #:  [id number]   cc:   Sanda Linger, MD  [address] [place] [address] [place] [address]

## 2010-11-05 LAB — HM DIABETES EYE EXAM

## 2010-12-01 ENCOUNTER — Other Ambulatory Visit: Payer: Self-pay | Admitting: Internal Medicine

## 2011-07-24 ENCOUNTER — Ambulatory Visit (INDEPENDENT_AMBULATORY_CARE_PROVIDER_SITE_OTHER): Payer: Managed Care, Other (non HMO) | Admitting: Internal Medicine

## 2011-07-24 ENCOUNTER — Other Ambulatory Visit (INDEPENDENT_AMBULATORY_CARE_PROVIDER_SITE_OTHER): Payer: Managed Care, Other (non HMO)

## 2011-07-24 ENCOUNTER — Encounter: Payer: Self-pay | Admitting: Internal Medicine

## 2011-07-24 DIAGNOSIS — E039 Hypothyroidism, unspecified: Secondary | ICD-10-CM

## 2011-07-24 DIAGNOSIS — I10 Essential (primary) hypertension: Secondary | ICD-10-CM

## 2011-07-24 DIAGNOSIS — Z0000 Encounter for general adult medical examination without abnormal findings: Secondary | ICD-10-CM | POA: Insufficient documentation

## 2011-07-24 DIAGNOSIS — D509 Iron deficiency anemia, unspecified: Secondary | ICD-10-CM | POA: Insufficient documentation

## 2011-07-24 DIAGNOSIS — IMO0001 Reserved for inherently not codable concepts without codable children: Secondary | ICD-10-CM

## 2011-07-24 DIAGNOSIS — E785 Hyperlipidemia, unspecified: Secondary | ICD-10-CM

## 2011-07-24 LAB — COMPREHENSIVE METABOLIC PANEL
ALT: 22 U/L (ref 0–35)
AST: 20 U/L (ref 0–37)
Albumin: 4 g/dL (ref 3.5–5.2)
CO2: 28 mEq/L (ref 19–32)
Calcium: 9.1 mg/dL (ref 8.4–10.5)
Chloride: 106 mEq/L (ref 96–112)
GFR: 60.02 mL/min (ref >60.00)
Potassium: 4.4 mEq/L (ref 3.5–5.1)
Sodium: 139 mEq/L (ref 135–145)
Total Protein: 7 g/dL (ref 6.0–8.3)

## 2011-07-24 LAB — URINALYSIS, ROUTINE W REFLEX MICROSCOPIC
Bilirubin Urine: NEGATIVE
Ketones, ur: NEGATIVE
Urobilinogen, UA: 0.2 (ref 0.0–1.0)

## 2011-07-24 LAB — CBC WITH DIFFERENTIAL/PLATELET
Basophils Absolute: 0.1 10*3/uL (ref 0.0–0.1)
Eosinophils Absolute: 0.1 10*3/uL (ref 0.0–0.7)
Hemoglobin: 14.7 g/dL (ref 12.0–15.0)
Lymphocytes Relative: 31 % (ref 12.0–46.0)
MCHC: 34.5 g/dL (ref 30.0–36.0)
Monocytes Relative: 5 % (ref 3.0–12.0)
Neutro Abs: 4.2 10*3/uL (ref 1.4–7.7)
Neutrophils Relative %: 61.4 % (ref 43.0–77.0)
Platelets: 191 10*3/uL (ref 150.0–400.0)
RDW: 13.3 % (ref 11.5–14.6)

## 2011-07-24 LAB — LIPID PANEL
Cholesterol: 142 mg/dL (ref 0–200)
HDL: 50.6 mg/dL (ref >39.00)
LDL Cholesterol: 79 mg/dL (ref 0–99)
Total CHOL/HDL Ratio: 3
Triglycerides: 60 mg/dL (ref 0.0–149.0)

## 2011-07-24 LAB — HM COLONOSCOPY

## 2011-07-24 LAB — IBC PANEL: Transferrin: 293.1 mg/dL (ref 212.0–360.0)

## 2011-07-24 LAB — HM DIABETES FOOT EXAM: HM Diabetic Foot Exam: NORMAL

## 2011-07-24 LAB — TSH: TSH: 2.91 u[IU]/mL (ref 0.35–5.50)

## 2011-07-24 MED ORDER — AMLODIPINE-OLMESARTAN 5-40 MG PO TABS: 1.0000 | ORAL_TABLET | Freq: Every day | ORAL | Status: DC

## 2011-07-24 NOTE — Assessment & Plan Note (Signed)
Check her CBC and iron level

## 2011-07-24 NOTE — Assessment & Plan Note (Signed)
a1c today 

## 2011-07-24 NOTE — Progress Notes (Signed)
Subjective:    Patient ID: Adriana Young, female    DOB: 06/27/1960, 51 y.o.   MRN: [id number]  Diabetes She presents for her follow-up diabetic visit. She has type 2 diabetes mellitus. Her disease course has been stable. There are no hypoglycemic associated symptoms. Pertinent negatives for hypoglycemia include no dizziness, headaches, pallor, seizures, speech difficulty or tremors. Pertinent negatives for diabetes include no blurred vision, no chest pain, no fatigue, no foot paresthesias, no foot ulcerations, no polydipsia, no polyphagia, no polyuria, no visual change, no weakness and no weight loss. There are no hypoglycemic complications. Symptoms are stable. There are no diabetic complications. Current diabetic treatment includes diet. She is compliant with treatment all of the time. Her weight is stable. She is following a generally healthy diet. Meal planning includes avoidance of concentrated sweets. She participates in exercise intermittently. There is no change in her home blood glucose trend. An ACE inhibitor/angiotensin II receptor blocker is being taken. She does not see a podiatrist.Eye exam is current.      Review of Systems  Constitutional: Negative for fever, chills, weight loss, diaphoresis, activity change, appetite change, fatigue and unexpected weight change.  HENT: Negative.   Eyes: Negative.  Negative for blurred vision.  Respiratory: Negative for apnea, cough, choking, chest tightness, shortness of breath, wheezing and stridor.   Cardiovascular: Negative for chest pain, palpitations and leg swelling.  Gastrointestinal: Negative for nausea, vomiting, abdominal pain, diarrhea, constipation and blood in stool.  Genitourinary: Negative.  Negative for polyuria.  Musculoskeletal: Negative for myalgias, back pain, joint swelling, arthralgias and gait problem.  Skin: Negative for color change, pallor, rash and wound.  Neurological: Negative for dizziness, tremors, seizures, syncope,  facial asymmetry, speech difficulty, weakness, light-headedness, numbness and headaches.  Hematological: Negative for polydipsia, polyphagia and adenopathy. Does not bruise/bleed easily.  Psychiatric/Behavioral: Negative.        Objective:   Physical Exam  Vitals reviewed. Constitutional: She is oriented to person, place, and time. She appears well-developed and well-nourished. No distress.  HENT:  Head: Normocephalic and atraumatic.  Mouth/Throat: Oropharynx is clear and moist. No oropharyngeal exudate.  Eyes: Conjunctivae are normal. Right eye exhibits no discharge. Left eye exhibits no discharge. No scleral icterus.  Neck: Normal range of motion. Neck supple. No JVD present. No tracheal deviation present. No thyromegaly present.  Cardiovascular: Normal rate, regular rhythm, normal heart sounds and intact distal pulses.  Exam reveals no gallop and no friction rub.   No murmur heard. Pulmonary/Chest: Effort normal and breath sounds normal. No stridor. No respiratory distress. She has no wheezes. She has no rales. She exhibits no tenderness.  Abdominal: Soft. Bowel sounds are normal. She exhibits no distension and no mass. There is no tenderness. There is no rebound and no guarding.  Musculoskeletal: Normal range of motion. She exhibits no edema and no tenderness.  Lymphadenopathy:    She has no cervical adenopathy.  Neurological: She is oriented to person, place, and time.  Skin: Skin is warm and dry. No rash noted. She is not diaphoretic. No erythema. No pallor.  Psychiatric: She has a normal mood and affect. Her behavior is normal. Judgment and thought content normal.      Lab Results  Component Value Date   WBC 6.4 07/22/2010   HGB 15.4* 07/22/2010   HCT 43.5 07/22/2010   PLT 229.0 07/22/2010   GLUCOSE 105* 07/22/2010   CHOL 159 07/22/2010   TRIG 111.0 07/22/2010   HDL 40.30 07/22/2010   LDLDIRECT 189.4 07/18/2009  LDLCALC 97 07/22/2010   ALT 24 07/22/2010   AST 23 07/22/2010   NA  137 07/22/2010   K 4.5 07/22/2010   CL 104 07/22/2010   CREATININE 1.0 07/22/2010   BUN 18 07/22/2010   CO2 25 07/22/2010   TSH 2.52 07/22/2010   HGBA1C 6.1 07/22/2010      Assessment & Plan:

## 2011-07-24 NOTE — Assessment & Plan Note (Signed)
Exam done, labs ordered, vaccines updated, pt ed material was given, her GYN and breast health is managed by her GYN MD

## 2011-07-24 NOTE — Assessment & Plan Note (Signed)
BP is well controlled, I will check her lytes and renal function today 

## 2011-07-24 NOTE — Patient Instructions (Signed)

## 2011-07-24 NOTE — Assessment & Plan Note (Signed)
She is doing well on lipitor, I will check her FLP today 

## 2011-07-24 NOTE — Assessment & Plan Note (Signed)
TSH today 

## 2011-08-28 ENCOUNTER — Encounter: Payer: Self-pay | Admitting: Gastroenterology

## 2011-09-04 ENCOUNTER — Ambulatory Visit (AMBULATORY_SURGERY_CENTER): Payer: Managed Care, Other (non HMO) | Admitting: *Deleted

## 2011-09-04 VITALS — Ht 68.0 in | Wt 204.9 lb

## 2011-09-04 DIAGNOSIS — Z1211 Encounter for screening for malignant neoplasm of colon: Secondary | ICD-10-CM

## 2011-09-04 MED ORDER — PEG-KCL-NACL-NASULF-NA ASC-C 100 G PO SOLR: ORAL | Status: DC

## 2011-09-18 ENCOUNTER — Encounter: Payer: Self-pay | Admitting: Gastroenterology

## 2011-09-18 ENCOUNTER — Ambulatory Visit (AMBULATORY_SURGERY_CENTER): Payer: Managed Care, Other (non HMO) | Admitting: Gastroenterology

## 2011-09-18 VITALS — BP 152/97 | HR 86 | Temp 96.3°F | Resp 16 | Ht 68.0 in | Wt 204.0 lb

## 2011-09-18 DIAGNOSIS — D126 Benign neoplasm of colon, unspecified: Secondary | ICD-10-CM

## 2011-09-18 DIAGNOSIS — Z1211 Encounter for screening for malignant neoplasm of colon: Secondary | ICD-10-CM

## 2011-09-18 MED ORDER — SODIUM CHLORIDE 0.9 % IV SOLN: 500.0000 mL | INTRAVENOUS | Status: DC

## 2011-09-18 NOTE — Progress Notes (Signed)
Patient did not experience any of the following events: a burn prior to discharge; a fall within the facility; wrong site/side/patient/procedure/implant event; or a hospital transfer or hospital admission upon discharge from the facility. (G8907) Patient did not have preoperative order for IV antibiotic SSI prophylaxis. (G8918)  

## 2011-09-18 NOTE — Patient Instructions (Signed)
Discharge instructions given with verbal understanding. Normal exam. Resume previous medications. YOU HAD AN ENDOSCOPIC PROCEDURE TODAY AT THE [facility]: Refer to the procedure report that was given to you for any specific questions about what was found during the examination.  If the procedure report does not answer your questions, please call your gastroenterologist to clarify.  If you requested that your care partner not be given the details of your procedure findings, then the procedure report has been included in a sealed envelope for you to review at your convenience later.  YOU SHOULD EXPECT: Some feelings of bloating in the abdomen. Passage of more gas than usual.  Walking can help get rid of the air that was put into your GI tract during the procedure and reduce the bloating. If you had a lower endoscopy (such as a colonoscopy or flexible sigmoidoscopy) you may notice spotting of blood in your stool or on the toilet paper. If you underwent a bowel prep for your procedure, then you may not have a normal bowel movement for a few days.  DIET: Your first meal following the procedure should be a light meal and then it is ok to progress to your normal diet.  A half-sandwich or bowl of soup is an example of a good first meal.  Heavy or fried foods are harder to digest and may make you feel nauseous or bloated.  Likewise meals heavy in dairy and vegetables can cause extra gas to form and this can also increase the bloating.  Drink plenty of fluids but you should avoid alcoholic beverages for 24 hours.  ACTIVITY: Your care partner should take you home directly after the procedure.  You should plan to take it easy, moving slowly for the rest of the day.  You can resume normal activity the day after the procedure however you should NOT DRIVE or use heavy machinery for 24 hours (because of the sedation medicines used during the test).    SYMPTOMS TO REPORT IMMEDIATELY: A gastroenterologist  can be reached at any hour.  During normal business hours, 8:30 AM to 5:00 PM Monday through Friday, call [phone].  After hours and on weekends, please call the GI answering service at [phone] who will take a message and have the physician on call contact you.   Following lower endoscopy (colonoscopy or flexible sigmoidoscopy):  Excessive amounts of blood in the stool  Significant tenderness or worsening of abdominal pains  Swelling of the abdomen that is new, acute  Fever of 100F or higher  FOLLOW UP: If any biopsies were taken you will be contacted by phone or by letter within the next 1-3 weeks.  Call your gastroenterologist if you have not heard about the biopsies in 3 weeks.  Our staff will call the home number listed on your records the next business day following your procedure to check on you and address any questions or concerns that you may have at that time regarding the information given to you following your procedure. This is a courtesy call and so if there is no answer at the home number and we have not heard from you through the emergency physician on call, we will assume that you have returned to your regular daily activities without incident.  SIGNATURES/CONFIDENTIALITY: You and/or your care partner have signed paperwork which will be entered into your electronic medical record.  These signatures attest to the fact that that the information above on your After Visit Summary has been reviewed   and is understood.  Full responsibility of the confidentiality of this discharge information lies with you and/or your care-partner. 

## 2011-09-18 NOTE — Op Note (Signed)
[facility] [address] [place], [place]  [address]  COLONOSCOPY PROCEDURE REPORT  PATIENT:  Adriana, Young  MR#:  [id number] BIRTHDATE:  1960-08-03, 51 yrs. old  GENDER:  female ENDOSCOPIST:  Rachael Fee, MD REF. BY:  Etta Grandchild, M.D. PROCEDURE DATE:  09/18/2011 PROCEDURE:  Colonoscopy 19147 ASA CLASS:  Class II INDICATIONS:  Routine Risk Screening MEDICATIONS:  Fentanyl 50 mcg IV, These medications were titrated to patient response per physician's verbal order, Versed 6 mg IV  DESCRIPTION OF PROCEDURE:   After the risks benefits and alternatives of the procedure were thoroughly explained, informed consent was obtained.  Digital rectal exam was performed and revealed no rectal masses.   The LB [id number]-[id number] endoscope was introduced through the anus and advanced to the cecum, which was identified by both the appendix and ileocecal valve, without limitations.  The quality of the prep was good..  The instrument was then slowly withdrawn as the colon was fully examined. <<PROCEDUREIMAGES>> FINDINGS:  A normal appearing cecum, ileocecal valve, and appendiceal orifice were identified. The ascending, hepatic flexure, transverse, splenic flexure, descending, sigmoid colon, and rectum appeared unremarkable (see image2, image3, and image5). Retroflexed views in the rectum revealed no abnormalities. COMPLICATIONS:  None  ENDOSCOPIC IMPRESSION: 1) Normal colon 2) No polyps or cancers  RECOMMENDATIONS: 1) You should continue to follow colorectal cancer screening guidelines for "routine risk" patients with a repeat colonoscopy in 10 years. There is no need for FOBT (stool) testing for at least 5 years.  REPEAT EXAM:  10 years  ______________________________ Rachael Fee, MD  n. eSIGNED:   Rachael Fee at 09/18/2011 02:45 PM  [name], [phone]

## 2011-09-21 ENCOUNTER — Telehealth: Payer: Self-pay

## 2011-09-21 NOTE — Telephone Encounter (Signed)
  Follow up Call-  Call back number 09/18/2011  Post procedure Call Back phone  # [phone]  Permission to leave phone message Yes     Patient questions:  Do you have a fever, pain , or abdominal swelling? no Pain Score  0 *  Have you tolerated food without any problems? yes  Have you been able to return to your normal activities? yes  Do you have any questions about your discharge instructions: Diet   no Medications  no Follow up visit  no  Do you have questions or concerns about your Care? no  Actions: * If pain score is 4 or above: No action needed, pain <4.

## 2011-10-09 ENCOUNTER — Encounter: Payer: Self-pay | Admitting: Internal Medicine

## 2011-11-02 ENCOUNTER — Ambulatory Visit (INDEPENDENT_AMBULATORY_CARE_PROVIDER_SITE_OTHER): Payer: Managed Care, Other (non HMO) | Admitting: Internal Medicine

## 2011-11-02 ENCOUNTER — Other Ambulatory Visit (INDEPENDENT_AMBULATORY_CARE_PROVIDER_SITE_OTHER): Payer: Managed Care, Other (non HMO)

## 2011-11-02 ENCOUNTER — Encounter: Payer: Self-pay | Admitting: Internal Medicine

## 2011-11-02 VITALS — BP 130/76 | HR 73 | Temp 98.4°F | Resp 16 | Ht 70.0 in | Wt 206.0 lb

## 2011-11-02 DIAGNOSIS — E785 Hyperlipidemia, unspecified: Secondary | ICD-10-CM

## 2011-11-02 DIAGNOSIS — I10 Essential (primary) hypertension: Secondary | ICD-10-CM

## 2011-11-02 DIAGNOSIS — E039 Hypothyroidism, unspecified: Secondary | ICD-10-CM

## 2011-11-02 DIAGNOSIS — IMO0001 Reserved for inherently not codable concepts without codable children: Secondary | ICD-10-CM

## 2011-11-02 LAB — BASIC METABOLIC PANEL
BUN: 15 mg/dL (ref 6–23)
CO2: 26 mEq/L (ref 19–32)
Chloride: 104 mEq/L (ref 96–112)
Glucose, Bld: 92 mg/dL (ref 70–99)
Potassium: 4 mEq/L (ref 3.5–5.1)

## 2011-11-02 MED ORDER — ATORVASTATIN CALCIUM 80 MG PO TABS: 80.0000 mg | ORAL_TABLET | Freq: Every day | ORAL | Status: DC

## 2011-11-02 MED ORDER — AMLODIPINE-OLMESARTAN 5-40 MG PO TABS: 1.0000 | ORAL_TABLET | Freq: Every day | ORAL | Status: DC

## 2011-11-02 NOTE — Progress Notes (Signed)
Subjective:    Patient ID: Adriana Young, female    DOB: 13-Mar-1961, 51 y.o.   MRN: [id number]  Hypertension This is a chronic problem. The current episode started more than 1 year ago. The problem has been gradually improving since onset. The problem is controlled. Pertinent negatives include no anxiety, blurred vision, chest pain, headaches, malaise/fatigue, neck pain, orthopnea, palpitations, peripheral edema, PND, shortness of breath or sweats. Past treatments include angiotensin blockers and calcium channel blockers. The current treatment provides significant improvement. There are no compliance problems.  Hypertensive end-organ damage includes a thyroid problem.  Thyroid Problem Presents for follow-up visit. Patient reports no anxiety, cold intolerance, constipation, depressed mood, diaphoresis, diarrhea, dry skin, fatigue, hair loss, heat intolerance, hoarse voice, leg swelling, menstrual problem, nail problem, palpitations, tremors, visual change, weight gain or weight loss. The symptoms have been stable.      Review of Systems  Constitutional: Negative for fever, chills, weight loss, weight gain, malaise/fatigue, diaphoresis, activity change, appetite change, fatigue and unexpected weight change.  HENT: Negative.  Negative for hoarse voice and neck pain.   Eyes: Negative.  Negative for blurred vision.  Respiratory: Negative for apnea, cough, choking, chest tightness, shortness of breath, wheezing and stridor.   Cardiovascular: Negative for chest pain, palpitations, orthopnea, leg swelling and PND.  Gastrointestinal: Negative.  Negative for diarrhea and constipation.  Genitourinary: Negative.  Negative for menstrual problem.  Musculoskeletal: Negative.   Skin: Negative.   Neurological: Negative.  Negative for tremors and headaches.  Hematological: Negative.  Negative for cold intolerance and heat intolerance.  Psychiatric/Behavioral: Negative.        Objective:   Physical Exam    Vitals reviewed. Constitutional: She is oriented to person, place, and time. She appears well-developed and well-nourished. No distress.  HENT:  Head: Normocephalic and atraumatic.  Mouth/Throat: Oropharynx is clear and moist. No oropharyngeal exudate.  Eyes: Conjunctivae are normal. Right eye exhibits no discharge. Left eye exhibits no discharge. No scleral icterus.  Neck: Normal range of motion. Neck supple. No JVD present. No tracheal deviation present. No thyromegaly present.  Cardiovascular: Normal rate, regular rhythm, normal heart sounds and intact distal pulses.  Exam reveals no gallop and no friction rub.   No murmur heard. Pulmonary/Chest: Effort normal and breath sounds normal. No stridor. No respiratory distress. She has no wheezes. She has no rales. She exhibits no tenderness.  Abdominal: Soft. Bowel sounds are normal. She exhibits no distension and no mass. There is no tenderness. There is no rebound and no guarding.  Musculoskeletal: Normal range of motion. She exhibits no edema and no tenderness.  Lymphadenopathy:    She has no cervical adenopathy.  Neurological: She is oriented to person, place, and time.  Skin: Skin is warm and dry. No rash noted. She is not diaphoretic. No erythema. No pallor.  Psychiatric: She has a normal mood and affect. Her behavior is normal. Judgment and thought content normal.      Lab Results  Component Value Date   WBC 6.8 07/24/2011   HGB 14.7 07/24/2011   HCT 42.6 07/24/2011   PLT 191.0 07/24/2011   GLUCOSE 97 07/24/2011   CHOL 142 07/24/2011   TRIG 60.0 07/24/2011   HDL 50.60 07/24/2011   LDLDIRECT 189.4 07/18/2009   LDLCALC 79 07/24/2011   ALT 22 07/24/2011   AST 20 07/24/2011   NA 139 07/24/2011   K 4.4 07/24/2011   CL 106 07/24/2011   CREATININE 1.0 07/24/2011   BUN 18 07/24/2011  CO2 28 07/24/2011   TSH 2.91 07/24/2011   HGBA1C 5.5 07/24/2011      Assessment & Plan:

## 2011-11-02 NOTE — Patient Instructions (Signed)

## 2011-11-02 NOTE — Assessment & Plan Note (Signed)
She is doing well on lipitor 

## 2011-11-02 NOTE — Assessment & Plan Note (Signed)
Her BP is well controlled, I will check her lytes and renal function 

## 2011-11-02 NOTE — Assessment & Plan Note (Signed)
I will check her TSh today 

## 2011-11-04 ENCOUNTER — Telehealth: Payer: Self-pay

## 2011-11-04 DIAGNOSIS — IMO0001 Reserved for inherently not codable concepts without codable children: Secondary | ICD-10-CM

## 2011-11-04 DIAGNOSIS — I10 Essential (primary) hypertension: Secondary | ICD-10-CM

## 2011-11-04 MED ORDER — LOSARTAN POTASSIUM 100 MG PO TABS: 100.0000 mg | ORAL_TABLET | Freq: Every day | ORAL | Status: DC

## 2011-11-04 MED ORDER — AMLODIPINE BESYLATE 5 MG PO TABS: 5.0000 mg | ORAL_TABLET | Freq: Every day | ORAL | Status: DC

## 2011-11-04 NOTE — Telephone Encounter (Signed)
done

## 2011-11-04 NOTE — Telephone Encounter (Signed)
Pt called stating that Azor is too expensive. Pt is requesting medication changed to a generic, please advise.

## 2011-11-05 NOTE — Telephone Encounter (Signed)
Pt advised of Rx changes/pharmacy.

## 2012-07-13 ENCOUNTER — Other Ambulatory Visit: Payer: Self-pay | Admitting: Internal Medicine

## 2012-07-29 LAB — HM DIABETES EYE EXAM: HM Diabetic Eye Exam: NORMAL

## 2012-08-11 ENCOUNTER — Other Ambulatory Visit (INDEPENDENT_AMBULATORY_CARE_PROVIDER_SITE_OTHER): Payer: Managed Care, Other (non HMO)

## 2012-08-11 ENCOUNTER — Encounter: Payer: Self-pay | Admitting: Internal Medicine

## 2012-08-11 ENCOUNTER — Ambulatory Visit (INDEPENDENT_AMBULATORY_CARE_PROVIDER_SITE_OTHER): Payer: Managed Care, Other (non HMO) | Admitting: Internal Medicine

## 2012-08-11 VITALS — BP 126/74 | HR 67 | Temp 97.1°F | Resp 16 | Wt 200.0 lb

## 2012-08-11 DIAGNOSIS — IMO0001 Reserved for inherently not codable concepts without codable children: Secondary | ICD-10-CM

## 2012-08-11 DIAGNOSIS — I10 Essential (primary) hypertension: Secondary | ICD-10-CM

## 2012-08-11 DIAGNOSIS — Z0000 Encounter for general adult medical examination without abnormal findings: Secondary | ICD-10-CM

## 2012-08-11 DIAGNOSIS — E785 Hyperlipidemia, unspecified: Secondary | ICD-10-CM

## 2012-08-11 DIAGNOSIS — E039 Hypothyroidism, unspecified: Secondary | ICD-10-CM

## 2012-08-11 DIAGNOSIS — Z23 Encounter for immunization: Secondary | ICD-10-CM

## 2012-08-11 LAB — CBC WITH DIFFERENTIAL/PLATELET
Basophils Absolute: 0.1 10*3/uL (ref 0.0–0.1)
Lymphocytes Relative: 34.7 % (ref 12.0–46.0)
Monocytes Relative: 6.8 % (ref 3.0–12.0)
Neutrophils Relative %: 54.3 % (ref 43.0–77.0)
Platelets: 205 10*3/uL (ref 150.0–400.0)
RDW: 14.1 % (ref 11.5–14.6)

## 2012-08-11 LAB — URINALYSIS, ROUTINE W REFLEX MICROSCOPIC
Bilirubin Urine: NEGATIVE
Ketones, ur: NEGATIVE
Total Protein, Urine: NEGATIVE
Urine Glucose: NEGATIVE

## 2012-08-11 LAB — TSH: TSH: 2.18 u[IU]/mL (ref 0.35–5.50)

## 2012-08-11 LAB — LIPID PANEL
Total CHOL/HDL Ratio: 3
Triglycerides: 70 mg/dL (ref 0.0–149.0)

## 2012-08-11 LAB — COMPREHENSIVE METABOLIC PANEL
ALT: 26 U/L (ref 0–35)
CO2: 28 mEq/L (ref 19–32)
Calcium: 9.2 mg/dL (ref 8.4–10.5)
Chloride: 105 mEq/L (ref 96–112)
Creatinine, Ser: 1 mg/dL (ref 0.4–1.2)
GFR: 61.14 mL/min (ref >60.00)
Total Protein: 7.6 g/dL (ref 6.0–8.3)

## 2012-08-11 LAB — MICROALBUMIN / CREATININE URINE RATIO: Microalb, Ur: 0.4 mg/dL (ref 0.0–1.9)

## 2012-08-11 LAB — HM DIABETES FOOT EXAM: HM Diabetic Foot Exam: NORMAL

## 2012-08-11 NOTE — Patient Instructions (Signed)
Preventive Care for Adults, Female A healthy lifestyle and preventive care can promote health and wellness. Preventive health guidelines for women include the following key practices.  A routine yearly physical is a good way to check with your caregiver about your health and preventive screening. It is a chance to share any concerns and updates on your health, and to receive a thorough exam.  Visit your dentist for a routine exam and preventive care every 6 months. Brush your teeth twice a day and floss once a day. Good oral hygiene prevents tooth decay and gum disease.  The frequency of eye exams is based on your age, health, family medical history, use of contact lenses, and other factors. Follow your caregiver's recommendations for frequency of eye exams.  Eat a healthy diet. Foods like vegetables, fruits, whole grains, low-fat dairy products, and lean protein foods contain the nutrients you need without too many calories. Decrease your intake of foods high in solid fats, added sugars, and salt. Eat the right amount of calories for you.Get information about a proper diet from your caregiver, if necessary.  Regular physical exercise is one of the most important things you can do for your health. Most adults should get at least 150 minutes of moderate-intensity exercise (any activity that increases your heart rate and causes you to sweat) each week. In addition, most adults need muscle-strengthening exercises on 2 or more days a week.  Maintain a healthy weight. The body mass index (BMI) is a screening tool to identify possible weight problems. It provides an estimate of body fat based on height and weight. Your caregiver can help determine your BMI, and can help you achieve or maintain a healthy weight.For adults 20 years and older:  A BMI below 18.5 is considered underweight.  A BMI of 18.5 to 24.9 is normal.  A BMI of 25 to 29.9 is considered overweight.  A BMI of 30 and above is  considered obese.  Maintain normal blood lipids and cholesterol levels by exercising and minimizing your intake of saturated fat. Eat a balanced diet with plenty of fruit and vegetables. Blood tests for lipids and cholesterol should begin at age 20 and be repeated every 5 years. If your lipid or cholesterol levels are high, you are over 50, or you are at high risk for heart disease, you may need your cholesterol levels checked more frequently.Ongoing high lipid and cholesterol levels should be treated with medicines if diet and exercise are not effective.  If you smoke, find out from your caregiver how to quit. If you do not use tobacco, do not start.  If you are pregnant, do not drink alcohol. If you are breastfeeding, be very cautious about drinking alcohol. If you are not pregnant and choose to drink alcohol, do not exceed 1 drink per day. One drink is considered to be 12 ounces (355 mL) of beer, 5 ounces (148 mL) of wine, or 1.5 ounces (44 mL) of liquor.  Avoid use of street drugs. Do not share needles with anyone. Ask for help if you need support or instructions about stopping the use of drugs.  High blood pressure causes heart disease and increases the risk of stroke. Your blood pressure should be checked at least every 1 to 2 years. Ongoing high blood pressure should be treated with medicines if weight loss and exercise are not effective.  If you are 55 to 52 years old, ask your caregiver if you should take aspirin to prevent strokes.  Diabetes   screening involves taking a blood sample to check your fasting blood sugar level. This should be done once every 3 years, after age 45, if you are within normal weight and without risk factors for diabetes. Testing should be considered at a younger age or be carried out more frequently if you are overweight and have at least 1 risk factor for diabetes.  Breast cancer screening is essential preventive care for women. You should practice "breast  self-awareness." This means understanding the normal appearance and feel of your breasts and may include breast self-examination. Any changes detected, no matter how small, should be reported to a caregiver. Women in their 20s and 30s should have a clinical breast exam (CBE) by a caregiver as part of a regular health exam every 1 to 3 years. After age 40, women should have a CBE every year. Starting at age 40, women should consider having a mammography (breast X-ray test) every year. Women who have a family history of breast cancer should talk to their caregiver about genetic screening. Women at a high risk of breast cancer should talk to their caregivers about having magnetic resonance imaging (MRI) and a mammography every year.  The Pap test is a screening test for cervical cancer. A Pap test can show cell changes on the cervix that might become cervical cancer if left untreated. A Pap test is a procedure in which cells are obtained and examined from the lower end of the uterus (cervix).  Women should have a Pap test starting at age 21.  Between ages 21 and 29, Pap tests should be repeated every 2 years.  Beginning at age 30, you should have a Pap test every 3 years as long as the past 3 Pap tests have been normal.  Some women have medical problems that increase the chance of getting cervical cancer. Talk to your caregiver about these problems. It is especially important to talk to your caregiver if a new problem develops soon after your last Pap test. In these cases, your caregiver may recommend more frequent screening and Pap tests.  The above recommendations are the same for women who have or have not gotten the vaccine for human papillomavirus (HPV).  If you had a hysterectomy for a problem that was not cancer or a condition that could lead to cancer, then you no longer need Pap tests. Even if you no longer need a Pap test, a regular exam is a good idea to make sure no other problems are  starting.  If you are between ages 65 and 70, and you have had normal Pap tests going back 10 years, you no longer need Pap tests. Even if you no longer need a Pap test, a regular exam is a good idea to make sure no other problems are starting.  If you have had past treatment for cervical cancer or a condition that could lead to cancer, you need Pap tests and screening for cancer for at least 20 years after your treatment.  If Pap tests have been discontinued, risk factors (such as a new sexual partner) need to be reassessed to determine if screening should be resumed.  The HPV test is an additional test that may be used for cervical cancer screening. The HPV test looks for the virus that can cause the cell changes on the cervix. The cells collected during the Pap test can be tested for HPV. The HPV test could be used to screen women aged 30 years and older, and should   be used in women of any age who have unclear Pap test results. After the age of 30, women should have HPV testing at the same frequency as a Pap test.  Colorectal cancer can be detected and often prevented. Most routine colorectal cancer screening begins at the age of 50 and continues through age 75. However, your caregiver may recommend screening at an earlier age if you have risk factors for colon cancer. On a yearly basis, your caregiver may provide home test kits to check for hidden blood in the stool. Use of a small camera at the end of a tube, to directly examine the colon (sigmoidoscopy or colonoscopy), can detect the earliest forms of colorectal cancer. Talk to your caregiver about this at age 50, when routine screening begins. Direct examination of the colon should be repeated every 5 to 10 years through age 75, unless early forms of pre-cancerous polyps or small growths are found.  Hepatitis C blood testing is recommended for all people born from 1945 through 1965 and any individual with known risks for hepatitis C.  Practice  safe sex. Use condoms and avoid high-risk sexual practices to reduce the spread of sexually transmitted infections (STIs). STIs include gonorrhea, chlamydia, syphilis, trichomonas, herpes, HPV, and human immunodeficiency virus (HIV). Herpes, HIV, and HPV are viral illnesses that have no cure. They can result in disability, cancer, and death. Sexually active women aged 25 and younger should be checked for chlamydia. Older women with new or multiple partners should also be tested for chlamydia. Testing for other STIs is recommended if you are sexually active and at increased risk.  Osteoporosis is a disease in which the bones lose minerals and strength with aging. This can result in serious bone fractures. The risk of osteoporosis can be identified using a bone density scan. Women ages 65 and over and women at risk for fractures or osteoporosis should discuss screening with their caregivers. Ask your caregiver whether you should take a calcium supplement or vitamin D to reduce the rate of osteoporosis.  Menopause can be associated with physical symptoms and risks. Hormone replacement therapy is available to decrease symptoms and risks. You should talk to your caregiver about whether hormone replacement therapy is right for you.  Use sunscreen with sun protection factor (SPF) of 30 or more. Apply sunscreen liberally and repeatedly throughout the day. You should seek shade when your shadow is shorter than you. Protect yourself by wearing long sleeves, pants, a wide-brimmed hat, and sunglasses year round, whenever you are outdoors.  Once a month, do a whole body skin exam, using a mirror to look at the skin on your back. Notify your caregiver of new moles, moles that have irregular borders, moles that are larger than a pencil eraser, or moles that have changed in shape or color.  Stay current with required immunizations.  Influenza. You need a dose every fall (or winter). The composition of the flu vaccine  changes each year, so being vaccinated once is not enough.  Pneumococcal polysaccharide. You need 1 to 2 doses if you smoke cigarettes or if you have certain chronic medical conditions. You need 1 dose at age 65 (or older) if you have never been vaccinated.  Tetanus, diphtheria, pertussis (Tdap, Td). Get 1 dose of Tdap vaccine if you are younger than age 65, are over 65 and have contact with an infant, are a healthcare worker, are pregnant, or simply want to be protected from whooping cough. After that, you need a Td   booster dose every 10 years. Consult your caregiver if you have not had at least 3 tetanus and diphtheria-containing shots sometime in your life or have a deep or dirty wound.  HPV. You need this vaccine if you are a woman age 26 or younger. The vaccine is given in 3 doses over 6 months.  Measles, mumps, rubella (MMR). You need at least 1 dose of MMR if you were born in 1957 or later. You may also need a second dose.  Meningococcal. If you are age 19 to 21 and a first-year college student living in a residence hall, or have one of several medical conditions, you need to get vaccinated against meningococcal disease. You may also need additional booster doses.  Zoster (shingles). If you are age 60 or older, you should get this vaccine.  Varicella (chickenpox). If you have never had chickenpox or you were vaccinated but received only 1 dose, talk to your caregiver to find out if you need this vaccine.  Hepatitis A. You need this vaccine if you have a specific risk factor for hepatitis A virus infection or you simply wish to be protected from this disease. The vaccine is usually given as 2 doses, 6 to 18 months apart.  Hepatitis B. You need this vaccine if you have a specific risk factor for hepatitis B virus infection or you simply wish to be protected from this disease. The vaccine is given in 3 doses, usually over 6 months. Preventive Services / Frequency Ages 19 to 39  Blood  pressure check.** / Every 1 to 2 years.  Lipid and cholesterol check.** / Every 5 years beginning at age 20.  Clinical breast exam.** / Every 3 years for women in their 20s and 30s.  Pap test.** / Every 2 years from ages 21 through 29. Every 3 years starting at age 30 through age 65 or 70 with a history of 3 consecutive normal Pap tests.  HPV screening.** / Every 3 years from ages 30 through ages 65 to 70 with a history of 3 consecutive normal Pap tests.  Hepatitis C blood test.** / For any individual with known risks for hepatitis C.  Skin self-exam. / Monthly.  Influenza immunization.** / Every year.  Pneumococcal polysaccharide immunization.** / 1 to 2 doses if you smoke cigarettes or if you have certain chronic medical conditions.  Tetanus, diphtheria, pertussis (Tdap, Td) immunization. / A one-time dose of Tdap vaccine. After that, you need a Td booster dose every 10 years.  HPV immunization. / 3 doses over 6 months, if you are 26 and younger.  Measles, mumps, rubella (MMR) immunization. / You need at least 1 dose of MMR if you were born in 1957 or later. You may also need a second dose.  Meningococcal immunization. / 1 dose if you are age 19 to 21 and a first-year college student living in a residence hall, or have one of several medical conditions, you need to get vaccinated against meningococcal disease. You may also need additional booster doses.  Varicella immunization.** / Consult your caregiver.  Hepatitis A immunization.** / Consult your caregiver. 2 doses, 6 to 18 months apart.  Hepatitis B immunization.** / Consult your caregiver. 3 doses usually over 6 months. Ages 40 to 64  Blood pressure check.** / Every 1 to 2 years.  Lipid and cholesterol check.** / Every 5 years beginning at age 20.  Clinical breast exam.** / Every year after age 40.  Mammogram.** / Every year beginning at age 40   and continuing for as long as you are in good health. Consult with your  caregiver.  Pap test.** / Every 3 years starting at age 30 through age 65 or 70 with a history of 3 consecutive normal Pap tests.  HPV screening.** / Every 3 years from ages 30 through ages 65 to 70 with a history of 3 consecutive normal Pap tests.  Fecal occult blood test (FOBT) of stool. / Every year beginning at age 50 and continuing until age 75. You may not need to do this test if you get a colonoscopy every 10 years.  Flexible sigmoidoscopy or colonoscopy.** / Every 5 years for a flexible sigmoidoscopy or every 10 years for a colonoscopy beginning at age 50 and continuing until age 75.  Hepatitis C blood test.** / For all people born from 1945 through 1965 and any individual with known risks for hepatitis C.  Skin self-exam. / Monthly.  Influenza immunization.** / Every year.  Pneumococcal polysaccharide immunization.** / 1 to 2 doses if you smoke cigarettes or if you have certain chronic medical conditions.  Tetanus, diphtheria, pertussis (Tdap, Td) immunization.** / A one-time dose of Tdap vaccine. After that, you need a Td booster dose every 10 years.  Measles, mumps, rubella (MMR) immunization. / You need at least 1 dose of MMR if you were born in 1957 or later. You may also need a second dose.  Varicella immunization.** / Consult your caregiver.  Meningococcal immunization.** / Consult your caregiver.  Hepatitis A immunization.** / Consult your caregiver. 2 doses, 6 to 18 months apart.  Hepatitis B immunization.** / Consult your caregiver. 3 doses, usually over 6 months. Ages 65 and over  Blood pressure check.** / Every 1 to 2 years.  Lipid and cholesterol check.** / Every 5 years beginning at age 20.  Clinical breast exam.** / Every year after age 40.  Mammogram.** / Every year beginning at age 40 and continuing for as long as you are in good health. Consult with your caregiver.  Pap test.** / Every 3 years starting at age 30 through age 65 or 70 with a 3  consecutive normal Pap tests. Testing can be stopped between 65 and 70 with 3 consecutive normal Pap tests and no abnormal Pap or HPV tests in the past 10 years.  HPV screening.** / Every 3 years from ages 30 through ages 65 or 70 with a history of 3 consecutive normal Pap tests. Testing can be stopped between 65 and 70 with 3 consecutive normal Pap tests and no abnormal Pap or HPV tests in the past 10 years.  Fecal occult blood test (FOBT) of stool. / Every year beginning at age 50 and continuing until age 75. You may not need to do this test if you get a colonoscopy every 10 years.  Flexible sigmoidoscopy or colonoscopy.** / Every 5 years for a flexible sigmoidoscopy or every 10 years for a colonoscopy beginning at age 50 and continuing until age 75.  Hepatitis C blood test.** / For all people born from 1945 through 1965 and any individual with known risks for hepatitis C.  Osteoporosis screening.** / A one-time screening for women ages 65 and over and women at risk for fractures or osteoporosis.  Skin self-exam. / Monthly.  Influenza immunization.** / Every year.  Pneumococcal polysaccharide immunization.** / 1 dose at age 65 (or older) if you have never been vaccinated.  Tetanus, diphtheria, pertussis (Tdap, Td) immunization. / A one-time dose of Tdap vaccine if you are over   65 and have contact with an infant, are a healthcare worker, or simply want to be protected from whooping cough. After that, you need a Td booster dose every 10 years.  Varicella immunization.** / Consult your caregiver.  Meningococcal immunization.** / Consult your caregiver.  Hepatitis A immunization.** / Consult your caregiver. 2 doses, 6 to 18 months apart.  Hepatitis B immunization.** / Check with your caregiver. 3 doses, usually over 6 months. ** Family history and personal history of risk and conditions may change your caregiver's recommendations. Document Released: 07/07/2001 Document Revised: 08/03/2011  Document Reviewed: 10/06/2010 [employer] Patient Information 2013 [employer].  

## 2012-08-11 NOTE — Assessment & Plan Note (Signed)
She sees her GYN for breast exam, mammogram, and PAP Exam done today Vaccines were reviewed and updated Labs ordered Pt ed material was given

## 2012-08-11 NOTE — Progress Notes (Signed)
  Subjective:    Patient ID: Adriana Young, female    DOB: 02/06/1961, 52 y.o.   MRN: [id number]  Thyroid Problem Presents for follow-up visit. Patient reports no anxiety, cold intolerance, constipation, depressed mood, diaphoresis, diarrhea, dry skin, fatigue, hair loss, heat intolerance, hoarse voice, leg swelling, menstrual problem, nail problem, palpitations, visual change, weight gain or weight loss. The symptoms have been stable.      Review of Systems  Constitutional: Negative for fever, chills, weight loss, weight gain, diaphoresis, activity change, appetite change, fatigue and unexpected weight change.  HENT: Negative.  Negative for hoarse voice.   Eyes: Negative.   Respiratory: Negative for cough, chest tightness, shortness of breath, wheezing and stridor.   Cardiovascular: Negative for chest pain, palpitations and leg swelling.  Gastrointestinal: Negative for nausea, vomiting, abdominal pain, diarrhea, constipation and anal bleeding.  Endocrine: Negative for cold intolerance, heat intolerance, polydipsia, polyphagia and polyuria.  Genitourinary: Negative.  Negative for urgency, frequency, flank pain, decreased urine volume, difficulty urinating, menstrual problem and dyspareunia.  Musculoskeletal: Negative for myalgias, back pain, joint swelling and gait problem.  Skin: Negative.   Allergic/Immunologic: Negative for environmental allergies, food allergies and immunocompromised state.  Neurological: Negative.  Negative for dizziness, weakness and light-headedness.  Hematological: Negative for adenopathy. Does not bruise/bleed easily.  Psychiatric/Behavioral: Negative.        Objective:   Physical Exam  Vitals reviewed. Constitutional: She is oriented to person, place, and time. She appears well-developed and well-nourished. No distress.  HENT:  Head: Normocephalic and atraumatic.  Mouth/Throat: Oropharynx is clear and moist. No oropharyngeal exudate.  Eyes: Conjunctivae are  normal. Right eye exhibits no discharge. Left eye exhibits no discharge. No scleral icterus.  Neck: Normal range of motion. Neck supple. No JVD present. No tracheal deviation present. No thyromegaly present.  Cardiovascular: Normal rate, regular rhythm, normal heart sounds and intact distal pulses.  Exam reveals no gallop and no friction rub.   No murmur heard. Pulmonary/Chest: Effort normal and breath sounds normal. No stridor. No respiratory distress. She has no wheezes. She has no rales. She exhibits no tenderness.  Abdominal: Soft. Bowel sounds are normal. She exhibits no distension and no mass. There is no tenderness. There is no rebound and no guarding.  Musculoskeletal: Normal range of motion. She exhibits no edema and no tenderness.  Lymphadenopathy:    She has no cervical adenopathy.  Neurological: She is oriented to person, place, and time.  Skin: Skin is warm and dry. No rash noted. She is not diaphoretic. No erythema. No pallor.  Psychiatric: She has a normal mood and affect. Her behavior is normal. Judgment and thought content normal.     Lab Results  Component Value Date   WBC 6.8 07/24/2011   HGB 14.7 07/24/2011   HCT 42.6 07/24/2011   PLT 191.0 07/24/2011   GLUCOSE 92 11/02/2011   CHOL 142 07/24/2011   TRIG 60.0 07/24/2011   HDL 50.60 07/24/2011   LDLDIRECT 189.4 07/18/2009   LDLCALC 79 07/24/2011   ALT 22 07/24/2011   AST 20 07/24/2011   NA 140 11/02/2011   K 4.0 11/02/2011   CL 104 11/02/2011   CREATININE 1.1 11/02/2011   BUN 15 11/02/2011   CO2 26 11/02/2011   TSH 1.21 11/02/2011   HGBA1C 5.5 07/24/2011       Assessment & Plan:

## 2012-08-11 NOTE — Assessment & Plan Note (Signed)
She is doing well on lipitor FLP today 

## 2012-08-11 NOTE — Assessment & Plan Note (Signed)
I will check her TSH today and will adjust her dose if needed 

## 2012-08-11 NOTE — Assessment & Plan Note (Signed)
Her BP is well controlled Today I will check her lytes and renal function 

## 2012-11-23 LAB — HM PAP SMEAR: HM Pap smear: NORMAL

## 2012-11-30 ENCOUNTER — Other Ambulatory Visit: Payer: Self-pay | Admitting: Internal Medicine

## 2013-03-09 ENCOUNTER — Other Ambulatory Visit: Payer: Self-pay | Admitting: Internal Medicine

## 2013-03-09 ENCOUNTER — Ambulatory Visit (INDEPENDENT_AMBULATORY_CARE_PROVIDER_SITE_OTHER): Payer: Managed Care, Other (non HMO) | Admitting: Internal Medicine

## 2013-03-09 ENCOUNTER — Encounter: Payer: Self-pay | Admitting: Internal Medicine

## 2013-03-09 ENCOUNTER — Other Ambulatory Visit (INDEPENDENT_AMBULATORY_CARE_PROVIDER_SITE_OTHER): Payer: Managed Care, Other (non HMO)

## 2013-03-09 VITALS — BP 122/76 | HR 65 | Temp 98.3°F | Resp 16 | Ht 68.0 in | Wt 211.0 lb

## 2013-03-09 DIAGNOSIS — L989 Disorder of the skin and subcutaneous tissue, unspecified: Secondary | ICD-10-CM

## 2013-03-09 DIAGNOSIS — E039 Hypothyroidism, unspecified: Secondary | ICD-10-CM

## 2013-03-09 LAB — TSH: TSH: 5.1 u[IU]/mL (ref 0.35–5.50)

## 2013-03-09 NOTE — Patient Instructions (Signed)
Biopsy  A biopsy is a procedure in which small samples of tissue are removed from the body. The tissue is examined under a microscope. A biopsy may be done to determine the cause (diagnosis) of a condition or mass (tumor). A biopsy may also be done to determine the best treatment for you. In some instances, a biopsy may be performed on normal tissue to determine if cancer has spread or if a transplanted organ is being rejected. There are 2 ways to obtain samples:   Fine needle biopsy. Samples are removed using a thin needle inserted through the skin.   Open biopsy. Samples are removed after a cut (incision) is made through the skin.  LET YOUR CAREGIVER KNOW ABOUT:   Allergies to food or medicine.   Medicines taken, including vitamins, herbs, eyedrops, over-the-counter medicines, and creams.   Use of steroids (by mouth or creams).   Previous problems with anesthetics or numbing medicines.   History of bleeding problems or blood clots.   Previous surgery.   Other health problems, including diabetes and kidney problems.   Possibility of pregnancy, if this applies.  RISKS AND COMPLICATIONS   Bleeding from the biopsy site. The risk of bleeding is higher if you have a bleeding disorder or are taking any blood thinning medicines (anticoagulants).   Infection.   Injury to organs or structures near the biopsy site.   Chronic pain at the biopsy site. This is defined as pain that lasts for more than 3 months.   Very rarely, a second biopsy may be required if not enough tissue was collected during the first biopsy.  BEFORE THE PROCEDURE  Ask your caregiver what time you need to arrive for your procedure. Ask your caregiver whether you need to stop eating or drinking (fast) before your procedure. Ask your caregiver about changing or stopping your regular medicines. A blood sample may be done to determine your blood clotting time. Medicine may be given to help you relax (sedative).  PROCEDURE  During a fine needle  biopsy, you will be awake during the procedure. You will be positioned to allow the best possible access to the biopsy site. Let your caregiver know if the position is not comfortable. The biopsy site will be cleaned. A needle is inserted through your skin. You may feel mild discomfort during this procedure. The needle is withdrawn once tissue samples have been removed. Pressure may be applied to the biopsy site to reduce swelling and to ensure that bleeding has stopped. The samples will be sent to be examined.  During an open biopsy, you may be given medicine that numbs the area (local anesthetic) or medicine that makes you sleep (general anesthetic). An incision is made through the skin. A tissue sample or the entire mass is removed. The sample or mass will be sent to be examined. Sometimes, the sample or mass may be examined during the procedure. If the sample or mass contains cancer cells, further tissue or structures may be removed. The incision is then closed with stitches (sutures) or skin glue (adhesive).  AFTER THE PROCEDURE  Your recovery will be assessed and monitored. If there are no problems, you should be able to go home shortly after the procedure (outpatient). You will need to arrange for someone to drive you home if you received a sedative or pain relieving medicine during the procedure. Ask when your test results will be ready. Make sure you get your test results.  Document Released: 05/08/2000 Document Revised: 08/03/2011 Document 

## 2013-03-09 NOTE — Progress Notes (Signed)
Subjective:    Patient ID: Adriana Young, female    DOB: 1960-09-04, 53 y.o.   MRN: [id number]  Rash This is a new problem. The current episode started in the past 7 days. The problem is unchanged. Pain location: she has a dark, itchy rash in her left axilla for the past week. The rash is characterized by itchiness. She was exposed to nothing. Pertinent negatives include no anorexia, congestion, cough, diarrhea, eye pain, facial edema, fatigue, fever, joint pain, nail changes, rhinorrhea, shortness of breath, sore throat or vomiting. Past treatments include nothing.      Review of Systems  Constitutional: Negative.  Negative for fever, chills, diaphoresis and fatigue.  HENT: Negative.  Negative for congestion, rhinorrhea and sore throat.   Eyes: Negative.  Negative for pain.  Respiratory: Negative.  Negative for cough, choking, chest tightness, shortness of breath, wheezing and stridor.   Cardiovascular: Negative.  Negative for chest pain, palpitations and leg swelling.  Gastrointestinal: Negative.  Negative for nausea, vomiting, abdominal pain, diarrhea, constipation, blood in stool and anorexia.  Endocrine: Negative.   Genitourinary: Negative.   Musculoskeletal: Negative.  Negative for joint pain.  Skin: Positive for rash. Negative for nail changes, color change, pallor and wound.  Allergic/Immunologic: Negative.   Neurological: Negative.   Hematological: Negative.  Negative for adenopathy. Does not bruise/bleed easily.  Psychiatric/Behavioral: Negative.        Objective:   Physical Exam  Vitals reviewed. Constitutional: She appears well-developed and well-nourished. No distress.  HENT:  Head: Normocephalic and atraumatic.  Mouth/Throat: Oropharynx is clear and moist. No oropharyngeal exudate.  Eyes: Conjunctivae are normal. Right eye exhibits no discharge. Left eye exhibits no discharge. No scleral icterus.  Neck: Normal range of motion. Neck supple. No JVD present. No tracheal  deviation present. No thyromegaly present.  Cardiovascular: Normal rate, regular rhythm, normal heart sounds and intact distal pulses.  Exam reveals no gallop and no friction rub.   No murmur heard. Pulmonary/Chest: Effort normal and breath sounds normal. No stridor. No respiratory distress. She has no wheezes. She has no rales. She exhibits no tenderness.  Abdominal: Soft. Bowel sounds are normal. She exhibits no distension and no mass. There is no tenderness. There is no rebound and no guarding.  Musculoskeletal: Normal range of motion. She exhibits no edema and no tenderness.  The left axilla was prepped and draped in sterile fashion, local anesthesia was obtained with 2% lido with epi. Over the lateral aspect of the lesion a 3 mm punch biopsy was obtained. The would was closed with one simple, interrupted suture using 4.0 nylon. There was good closure effect. She tolerated this well. Neosporin and a dressing were applied.  Lymphadenopathy:    She has no cervical adenopathy.  Skin: Skin is warm, dry and intact. Rash noted. No purpura noted. Rash is not macular, not papular, not maculopapular, not nodular, not pustular, not vesicular and not urticarial. She is not diaphoretic. No erythema. No pallor.  In the left axilla there is a serpiginous brown velvety lesion with faint scale, there is no exudate/erythema/warmth/fluctuance/streaking  Psychiatric: She has a normal mood and affect. Her behavior is normal. Judgment and thought content normal.     Lab Results  Component Value Date   WBC 4.8 08/11/2012   HGB 15.6* 08/11/2012   HCT 45.1 08/11/2012   PLT 205.0 08/11/2012   GLUCOSE 98 08/11/2012   CHOL 147 08/11/2012   TRIG 70.0 08/11/2012   HDL 44.90 08/11/2012   LDLDIRECT 189.4 07/18/2009  LDLCALC 88 08/11/2012   ALT 26 08/11/2012   AST 22 08/11/2012   NA 139 08/11/2012   K 4.4 08/11/2012   CL 105 08/11/2012   CREATININE 1.0 08/11/2012   BUN 15 08/11/2012   CO2 28 08/11/2012   TSH 2.18 08/11/2012    HGBA1C 5.7 08/11/2012   MICROALBUR 0.4 08/11/2012       Assessment & Plan:

## 2013-03-12 ENCOUNTER — Encounter: Payer: Self-pay | Admitting: Internal Medicine

## 2013-03-12 NOTE — Assessment & Plan Note (Signed)
Biopsy was sent I await the results Will treat as indicated

## 2013-03-12 NOTE — Assessment & Plan Note (Signed)
Her TSH is in the normal range 

## 2013-03-16 ENCOUNTER — Ambulatory Visit (INDEPENDENT_AMBULATORY_CARE_PROVIDER_SITE_OTHER): Payer: Managed Care, Other (non HMO) | Admitting: Internal Medicine

## 2013-03-16 ENCOUNTER — Encounter: Payer: Self-pay | Admitting: Internal Medicine

## 2013-03-16 VITALS — BP 136/86 | HR 80 | Temp 98.1°F | Resp 16 | Ht 68.0 in | Wt 211.0 lb

## 2013-03-16 DIAGNOSIS — L720 Epidermal cyst: Secondary | ICD-10-CM

## 2013-03-16 DIAGNOSIS — L723 Sebaceous cyst: Secondary | ICD-10-CM

## 2013-03-16 NOTE — Progress Notes (Signed)
  Subjective:    Patient ID: Adriana Young, female    DOB: 1961-03-01, 52 y.o.   MRN: [id number]  HPI Comments: She returns for suture removal and to discuss her biopsy results. There is some itching in the left axilla but otherwise she feels well.     Review of Systems  All other systems reviewed and are negative.       Objective:   Physical Exam  Skin: Rash noted.  Left axilla - suture was removed. The hyperpigmented lesion is still present and is unchanged.     Lab Results  Component Value Date   WBC 4.8 08/11/2012   HGB 15.6* 08/11/2012   HCT 45.1 08/11/2012   PLT 205.0 08/11/2012   GLUCOSE 98 08/11/2012   CHOL 147 08/11/2012   TRIG 70.0 08/11/2012   HDL 44.90 08/11/2012   LDLDIRECT 189.4 07/18/2009   LDLCALC 88 08/11/2012   ALT 26 08/11/2012   AST 22 08/11/2012   NA 139 08/11/2012   K 4.4 08/11/2012   CL 105 08/11/2012   CREATININE 1.0 08/11/2012   BUN 15 08/11/2012   CO2 28 08/11/2012   TSH 5.10 03/09/2013   HGBA1C 5.7 08/11/2012   MICROALBUR 0.4 08/11/2012       Assessment & Plan:

## 2013-03-16 NOTE — Assessment & Plan Note (Signed)
I have asked her to see GS to see if this cyst can be excised

## 2013-03-16 NOTE — Patient Instructions (Signed)

## 2013-03-21 ENCOUNTER — Ambulatory Visit (INDEPENDENT_AMBULATORY_CARE_PROVIDER_SITE_OTHER): Payer: Managed Care, Other (non HMO) | Admitting: General Surgery

## 2013-03-21 ENCOUNTER — Encounter (INDEPENDENT_AMBULATORY_CARE_PROVIDER_SITE_OTHER): Payer: Self-pay | Admitting: General Surgery

## 2013-03-21 VITALS — BP 142/84 | HR 74 | Temp 97.7°F | Resp 16 | Ht 69.5 in | Wt 210.4 lb

## 2013-03-21 DIAGNOSIS — L729 Follicular cyst of the skin and subcutaneous tissue, unspecified: Secondary | ICD-10-CM

## 2013-03-21 DIAGNOSIS — L723 Sebaceous cyst: Secondary | ICD-10-CM

## 2013-03-21 NOTE — Progress Notes (Signed)
Subjective:     Patient ID: Adriana Young, female   DOB: Mar 25, 1961, 52 y.o.   MRN: [id number]  HPI The patient is a 52 year old female had a previous history of a left axillary cyst. Patient was seen by Dr. Yetta Barre and had a punch biopsy. She states her last several days she's had a resolution of the cyst. Punch biopsy revealed epidermoid cyst. Patient states assistant previously been there for approximately 2 weeks.  Based on this no other previous cysts in the past.  Patient was also concerned about a mole which her mid back area. She states it has been no change in the size color or irregularity.  Review of Systems  Constitutional: Negative.   HENT: Negative.   Respiratory: Negative.   Cardiovascular: Negative.   Neurological: Negative.   All other systems reviewed and are negative.       Objective:   Physical Exam  Constitutional: She is oriented to person, place, and time. She appears well-developed and well-nourished.  HENT:  Head: Normocephalic and atraumatic.  Eyes: Conjunctivae and EOM are normal. Pupils are equal, round, and reactive to light.  Neck: Normal range of motion. Neck supple.  Cardiovascular: Normal rate, regular rhythm and normal heart sounds.   Pulmonary/Chest: Effort normal and breath sounds normal.  Abdominal: Soft. Bowel sounds are normal.  Musculoskeletal: Normal range of motion.  Neurological: She is alert and oriented to person, place, and time.  Skin:          Assessment:     52 year old female with a left axillary epidermoid cyst     Plan:     1.  Status post patient did be a possibility of her cyst returning. Should this arising and she can cause back for possible surgical excision. 2. After examining her morning I feel that it is a concern for melanoma. She continues to illness at this time. I discussed with her the signs to be concerned about which include increased in size, color, or ulceration. 3. Patient follow up as needed

## 2013-05-20 ENCOUNTER — Ambulatory Visit: Payer: Managed Care, Other (non HMO)

## 2013-05-20 ENCOUNTER — Ambulatory Visit: Payer: Managed Care, Other (non HMO) | Admitting: Family Medicine

## 2013-05-20 ENCOUNTER — Telehealth: Payer: Self-pay

## 2013-05-20 VITALS — BP 134/82 | HR 72 | Temp 99.3°F | Resp 16 | Ht 68.0 in | Wt 217.0 lb

## 2013-05-20 DIAGNOSIS — M19079 Primary osteoarthritis, unspecified ankle and foot: Secondary | ICD-10-CM

## 2013-05-20 LAB — POCT CBC
Granulocyte percent: 58 %G (ref 37–80)
HCT, POC: 49.8 % — AB (ref 37.7–47.9)
Hemoglobin: 15.4 g/dL (ref 12.2–16.2)
Lymph, poc: 2.8 (ref 0.6–3.4)
MCH, POC: 30.1 pg (ref 27–31.2)
MCHC: 30.9 g/dL — AB (ref 31.8–35.4)
MCV: 97.2 fL — AB (ref 80–97)
MID (cbc): 0.4 (ref 0–0.9)
MPV: 10.4 fL (ref 0–99.8)
POC Granulocyte: 4.5 (ref 2–6.9)
POC LYMPH PERCENT: 36.4 %L (ref 10–50)
POC MID %: 5.6 %M (ref 0–12)
Platelet Count, POC: 218 10*3/uL (ref 142–424)
RBC: 5.12 M/uL (ref 4.04–5.48)
RDW, POC: 13.1 %
WBC: 7.7 10*3/uL (ref 4.6–10.2)

## 2013-05-20 LAB — BASIC METABOLIC PANEL
BUN: 14 mg/dL (ref 6–23)
CO2: 27 mEq/L (ref 19–32)
Calcium: 9.5 mg/dL (ref 8.4–10.5)
Chloride: 101 mEq/L (ref 96–112)
Creat: 1 mg/dL (ref 0.50–1.10)
Glucose, Bld: 69 mg/dL — ABNORMAL LOW (ref 70–99)
Potassium: 3.8 mEq/L (ref 3.5–5.3)
Sodium: 137 mEq/L (ref 135–145)

## 2013-05-20 LAB — POCT SEDIMENTATION RATE: POCT SED RATE: 9 mm/hr (ref 0–22)

## 2013-05-20 LAB — URIC ACID: Uric Acid, Serum: 5.4 mg/dL (ref 2.4–7.0)

## 2013-05-20 MED ORDER — INDOMETHACIN 50 MG PO CAPS: 50.0000 mg | ORAL_CAPSULE | Freq: Three times a day (TID) | ORAL | Status: DC

## 2013-05-20 NOTE — Patient Instructions (Signed)

## 2013-05-20 NOTE — Progress Notes (Signed)
This chart was scribed for Sherren Mocha, MD by Luisa Dago, ED Scribe. This patient was seen in room 13 and the patient's care was started at 1:35 PM.  Subjective:    Patient ID: Adriana Young, female    DOB: 07-01-1960, 52 y.o.   MRN: [id number] Chief Complaint  Patient presents with  . Foot Pain    left foot ; since weds. Had some cramping in toes weeks ago     HPI HPI Comments: Adriana Young is a 52 y.o. female who presents to [facility] complaining of left foot great toe pain and swelling that started 3 days ago. Pt states that she was at work Wednesday when her left foot started hurting. She says that when she took off her shoe she noticed mild swelling of her left foot. However, she returned to work the next day where she experienced a similar episode as before. Upon waking this morning she noticed that the swelling of her left foot had gotten worse, and she could not walk on it or put her foot in a shoe without feeling severe pain. She denies taking any OTC to relieve her symptoms. Pt has no history of Gout.      Past Medical History  Diagnosis Date  . GERD (gastroesophageal reflux disease)   . Hyperlipidemia   . Hypertension   . Osteoarthritis   . Headache(784.0)   . Allergy   . Glaucoma   . Thyroid disease     hypo  . Type II or unspecified type diabetes mellitus without mention of complication, uncontrolled 09/19/2010   Current Outpatient Prescriptions on File Prior to Visit  Medication Sig Dispense Refill  . aspirin 81 MG tablet Take 81 mg by mouth daily.        . Calcium Carbonate-Vitamin D (CALCIUM 600/VITAMIN D PO) Take by mouth.        Marland Kitchen DIAZEPAM PO Take by mouth. Takes 10 mg prn before dental visits      . Ibuprofen 200 MG CAPS Take by mouth as needed.       Marland Kitchen levonorgestrel-ethinyl estradiol (ORSYTHIA) 0.1-20 MG-MCG tablet Take 1 tablet by mouth daily.      . Multiple Vitamin (MULTIVITAMIN) tablet Take 1 tablet by mouth daily.        Marland Kitchen amLODipine  (NORVASC) 5 MG tablet Take 1 tablet (5 mg total) by mouth daily.  90 tablet  3  . atorvastatin (LIPITOR) 80 MG tablet Take 1 tablet (80 mg total) by mouth daily.  90 tablet  3  . diphenhydrAMINE (BENADRYL) 25 MG tablet Take 25 mg by mouth every 6 (six) hours as needed.      . famciclovir (FAMVIR) 500 MG tablet Take 500 mg by mouth.        . Flaxseed, Linseed, (FLAXSEED OIL) 1000 MG CAPS Take 1 capsule by mouth daily.      Marland Kitchen levothyroxine (SYNTHROID, LEVOTHROID) 125 MCG tablet TAKE ONE TABLET BY MOUTH ONCE DAILY  90 tablet  0  . losartan (COZAAR) 100 MG tablet Take 1 tablet (100 mg total) by mouth daily.  90 tablet  3  . Magnesium 500 MG TABS Take by mouth daily.       Current Facility-Administered Medications on File Prior to Visit  Medication Dose Route Frequency Provider Last Rate Last Dose  . 0.9 %  sodium chloride infusion  500 mL Intravenous Continuous Rachael Fee, MD       No Known Allergies  Review  of Systems  Constitutional: Negative for fever, chills, diaphoresis, activity change and appetite change.  HENT: Negative for congestion, rhinorrhea and sore throat.   Respiratory: Negative for cough and shortness of breath.   Gastrointestinal: Negative for nausea, vomiting and diarrhea.  Musculoskeletal: Positive for joint swelling (mtp joint) and myalgias (left foot pain, over great toe area).  Neurological: Negative for headaches.      Vital signs: BP 134/82  Pulse 72  Temp(Src) 99.3 F (37.4 C) (Oral)  Resp 16  Ht 5\' 8"  (1.727 m)  Wt 217 lb (98.431 kg)  BMI 33.00 kg/m2  SpO2 99%  LMP 05/15/2013 Objective:   Physical Exam  Nursing note and vitals reviewed. Constitutional: She is oriented to person, place, and time. She appears well-developed and well-nourished.  HENT:  Head: Normocephalic and atraumatic.  Cardiovascular: Normal rate.   Pulses:      Dorsalis pedis pulses are 2+ on the right side.       Posterior tibial pulses are 2+ on the right side.    Pulmonary/Chest: Effort normal.  Abdominal: She exhibits no distension.  Musculoskeletal:  Mildly decreased ROM in the MTP. No significant tenderness on mtp join on the dorsal part, but severe pain upon palpation to the sole of her left great toe area.  Neurological: She is alert and oriented to person, place, and time.  Skin: Skin is warm and dry.  Psychiatric: She has a normal mood and affect.    Results for orders placed in visit on 05/20/13  POCT CBC      Result Value Range   WBC 7.7  4.6 - 10.2 K/uL   Lymph, poc 2.8  0.6 - 3.4   POC LYMPH PERCENT 36.4  10 - 50 %L   MID (cbc) 0.4  0 - 0.9   POC MID % 5.6  0 - 12 %M   POC Granulocyte 4.5  2 - 6.9   Granulocyte percent 58.0  37 - 80 %G   RBC 5.12  4.04 - 5.48 M/uL   Hemoglobin 15.4  12.2 - 16.2 g/dL   HCT, POC 16.1 (*) 09.6 - 47.9 %   MCV 97.2 (*) 80 - 97 fL   MCH, POC 30.1  27 - 31.2 pg   MCHC 30.9 (*) 31.8 - 35.4 g/dL   RDW, POC 04.5     Platelet Count, POC 218  142 - 424 K/uL   MPV 10.4  0 - 99.8 fL   Primary X-ray reading by Dr Clelia Croft Left first toe xray: Normal     EXAM: LEFT GREAT TOE  COMPARISON: None.  FINDINGS: There is no evidence of fracture or dislocation. There is no evidence of arthropathy or other focal bone abnormality. Soft tissues are unremarkable.  IMPRESSION: Negative.   Assessment & Plan:  Arthritis of first MTP joint - Plan: DG Toe Great Left, POCT CBC, POCT SEDIMENTATION RATE, Basic metabolic panel, Uric Acid Doubt gout despite location due to nml sed rate.  Try below strong nsaid, if continues, consider podiatry eval. Meds ordered this encounter  Medications  . indomethacin (INDOCIN) 50 MG capsule    Sig: Take 1 capsule (50 mg total) by mouth 3 (three) times daily with meals.    Dispense:  90 capsule    Refill:  0    I personally performed the services described in this documentation, which was scribed in my presence. The recorded information has been reviewed and considered, and  addended by me as needed.  Norberto Sorenson,  MD MPH

## 2013-05-20 NOTE — Telephone Encounter (Signed)
Patients medication (indomethacin (INDOCIN) 50 MG capsule [[id number]] ) today was sent to the wrong pharmacy at [facility] on [address]- her preferred is [facility] on [address] (she says her insurance covers it there) . Please change or call it in or send electronically for patient. She will wait for a call when it is done.   Best: [phone]

## 2013-05-21 NOTE — Telephone Encounter (Signed)
Spoke to patient and she already picked up at [facility]

## 2013-06-01 ENCOUNTER — Other Ambulatory Visit: Payer: Self-pay | Admitting: Internal Medicine

## 2013-10-04 ENCOUNTER — Encounter: Payer: Self-pay | Admitting: Internal Medicine

## 2013-10-04 ENCOUNTER — Ambulatory Visit (INDEPENDENT_AMBULATORY_CARE_PROVIDER_SITE_OTHER): Payer: Managed Care, Other (non HMO) | Admitting: Internal Medicine

## 2013-10-04 ENCOUNTER — Other Ambulatory Visit (INDEPENDENT_AMBULATORY_CARE_PROVIDER_SITE_OTHER): Payer: Managed Care, Other (non HMO)

## 2013-10-04 VITALS — BP 138/88 | HR 62 | Temp 97.6°F | Resp 16 | Ht 68.0 in | Wt 212.2 lb

## 2013-10-04 DIAGNOSIS — I10 Essential (primary) hypertension: Secondary | ICD-10-CM

## 2013-10-04 DIAGNOSIS — Z0000 Encounter for general adult medical examination without abnormal findings: Secondary | ICD-10-CM

## 2013-10-04 DIAGNOSIS — K219 Gastro-esophageal reflux disease without esophagitis: Secondary | ICD-10-CM

## 2013-10-04 DIAGNOSIS — M1990 Unspecified osteoarthritis, unspecified site: Secondary | ICD-10-CM

## 2013-10-04 DIAGNOSIS — E039 Hypothyroidism, unspecified: Secondary | ICD-10-CM

## 2013-10-04 LAB — COMPREHENSIVE METABOLIC PANEL
ALT: 19 U/L (ref 0–35)
AST: 21 U/L (ref 0–37)
Albumin: 3.9 g/dL (ref 3.5–5.2)
Alkaline Phosphatase: 43 U/L (ref 39–117)
BUN: 17 mg/dL (ref 6–23)
CO2: 26 meq/L (ref 19–32)
CREATININE: 1.1 mg/dL (ref 0.4–1.2)
Calcium: 9.2 mg/dL (ref 8.4–10.5)
Chloride: 108 mEq/L (ref 96–112)
GFR: 58.2 mL/min — AB (ref >60.00)
Glucose, Bld: 99 mg/dL (ref 70–99)
Potassium: 4.3 mEq/L (ref 3.5–5.1)
SODIUM: 139 meq/L (ref 135–145)
TOTAL PROTEIN: 6.9 g/dL (ref 6.0–8.3)
Total Bilirubin: 0.7 mg/dL (ref 0.2–1.2)

## 2013-10-04 LAB — CBC WITH DIFFERENTIAL/PLATELET
Basophils Absolute: 0.1 10*3/uL (ref 0.0–0.1)
Basophils Relative: 1.1 % (ref 0.0–3.0)
EOS PCT: 1.5 % (ref 0.0–5.0)
Eosinophils Absolute: 0.1 10*3/uL (ref 0.0–0.7)
HEMATOCRIT: 44 % (ref 36.0–46.0)
Hemoglobin: 15.2 g/dL — ABNORMAL HIGH (ref 12.0–15.0)
LYMPHS ABS: 2 10*3/uL (ref 0.7–4.0)
Lymphocytes Relative: 33.8 % (ref 12.0–46.0)
MCHC: 34.5 g/dL (ref 30.0–36.0)
MCV: 91.7 fl (ref 78.0–100.0)
Monocytes Absolute: 0.4 10*3/uL (ref 0.1–1.0)
Monocytes Relative: 6 % (ref 3.0–12.0)
Neutro Abs: 3.4 10*3/uL (ref 1.4–7.7)
Neutrophils Relative %: 57.6 % (ref 43.0–77.0)
PLATELETS: 200 10*3/uL (ref 150.0–400.0)
RBC: 4.8 Mil/uL (ref 3.87–5.11)
RDW: 12.9 % (ref 11.5–15.5)
WBC: 5.9 10*3/uL (ref 4.0–10.5)

## 2013-10-04 LAB — LIPID PANEL
CHOLESTEROL: 135 mg/dL (ref 0–200)
HDL: 43.8 mg/dL (ref >39.00)
LDL Cholesterol: 83 mg/dL (ref 0–99)
TRIGLYCERIDES: 42 mg/dL (ref 0.0–149.0)
Total CHOL/HDL Ratio: 3
VLDL: 8.4 mg/dL (ref 0.0–40.0)

## 2013-10-04 LAB — TSH: TSH: 2.55 u[IU]/mL (ref 0.35–4.50)

## 2013-10-04 MED ORDER — INDOMETHACIN 50 MG PO CAPS: 50.0000 mg | ORAL_CAPSULE | Freq: Three times a day (TID) | ORAL | Status: DC

## 2013-10-04 NOTE — Assessment & Plan Note (Signed)
I will check her TSH today and will adjust her dose if needed 

## 2013-10-04 NOTE — Assessment & Plan Note (Signed)
She will cont indocin as needed

## 2013-10-04 NOTE — Assessment & Plan Note (Signed)
Exam done Vaccines were reviewed Labs ordered Pt ed material was given 

## 2013-10-04 NOTE — Progress Notes (Signed)
Subjective:    Patient ID: Adriana Young, female    DOB: [birth year], 53 y.o.   MRN: [id number]  Arthritis Presents for follow-up visit. The disease course has been fluctuating. She complains of pain. She reports no stiffness, joint swelling or joint warmth. Affected locations include the right knee, left knee and left foot. Her pain is at a severity of 3/10. Pertinent negatives include no diarrhea, dry eyes, dry mouth, dysuria, fatigue, fever, pain at night, pain while resting, rash, Raynaud's syndrome, uveitis or weight loss. Her past medical history is significant for osteoarthritis. Her pertinent risk factors include overuse. Past treatments include NSAIDs. The treatment provided significant relief. Factors aggravating her arthritis include activity.      Review of Systems  Constitutional: Negative.  Negative for fever, chills, weight loss, diaphoresis, appetite change and fatigue.  HENT: Negative.   Eyes: Negative.   Respiratory: Negative.  Negative for cough, choking, chest tightness, shortness of breath and stridor.   Cardiovascular: Negative.  Negative for chest pain, palpitations and leg swelling.  Gastrointestinal: Negative.  Negative for nausea, vomiting, abdominal pain, diarrhea, constipation and blood in stool.  Endocrine: Negative.   Genitourinary: Negative.  Negative for dysuria.  Musculoskeletal: Positive for arthralgias and arthritis. Negative for back pain, gait problem, joint swelling, myalgias, neck pain, neck stiffness and stiffness.  Skin: Negative.  Negative for rash.  Allergic/Immunologic: Negative.   Neurological: Negative.  Negative for dizziness, tremors, syncope, light-headedness, numbness and headaches.  Hematological: Negative.  Negative for adenopathy. Does not bruise/bleed easily.  Psychiatric/Behavioral: Negative.        Objective:   Physical Exam  Vitals reviewed. Constitutional: She is oriented to person, place, and time. She appears well-developed and  well-nourished. No distress.  HENT:  Head: Normocephalic and atraumatic.  Mouth/Throat: Oropharynx is clear and moist. No oropharyngeal exudate.  Eyes: Conjunctivae are normal. Right eye exhibits no discharge. Left eye exhibits no discharge. No scleral icterus.  Neck: Normal range of motion. Neck supple. No JVD present. No tracheal deviation present. No thyromegaly present.  Cardiovascular: Normal rate, regular rhythm, normal heart sounds and intact distal pulses.  Exam reveals no gallop and no friction rub.   No murmur heard. Pulmonary/Chest: Effort normal and breath sounds normal. No stridor. No respiratory distress. She has no wheezes. She has no rales. She exhibits no tenderness.  Abdominal: Soft. Bowel sounds are normal. She exhibits no distension and no mass. There is no tenderness. There is no rebound and no guarding.  Musculoskeletal: Normal range of motion. She exhibits no edema and no tenderness.       Right knee: Normal.       Left knee: Normal.       Right foot: Normal.       Left foot: Normal.  Lymphadenopathy:    She has no cervical adenopathy.  Neurological: She is oriented to person, place, and time.  Skin: Skin is warm and dry. No rash noted. She is not diaphoretic. No erythema. No pallor.  Psychiatric: She has a normal mood and affect. Her behavior is normal. Judgment and thought content normal.     Lab Results  Component Value Date   WBC 7.7 05/20/2013   HGB 15.4 05/20/2013   HCT 49.8* 05/20/2013   PLT 205.0 08/11/2012   GLUCOSE 69* 05/20/2013   CHOL 147 08/11/2012   TRIG 70.0 08/11/2012   HDL 44.90 08/11/2012   LDLDIRECT 189.4 07/18/2009   LDLCALC 88 08/11/2012   ALT 26 08/11/2012   AST 22  08/11/2012   NA 137 05/20/2013   K 3.8 05/20/2013   CL 101 05/20/2013   CREATININE 1.00 05/20/2013   BUN 14 05/20/2013   CO2 27 05/20/2013   TSH 5.10 03/09/2013   HGBA1C 5.7 08/11/2012   MICROALBUR 0.4 08/11/2012       Assessment & Plan:

## 2013-10-04 NOTE — Assessment & Plan Note (Signed)
Her BP is well controlled 

## 2013-10-04 NOTE — Patient Instructions (Signed)
Hypothyroidism The thyroid is a large gland located in the lower front of your neck. The thyroid gland helps control metabolism. Metabolism is how your body handles food. It controls metabolism with the hormone thyroxine. When this gland is underactive (hypothyroid), it produces too little hormone.  CAUSES These include:   Absence or destruction of thyroid tissue.  Goiter due to iodine deficiency.  Goiter due to medications.  Congenital defects (since birth).  Problems with the pituitary. This causes a lack of TSH (thyroid stimulating hormone). This hormone tells the thyroid to turn out more hormone. SYMPTOMS  Lethargy (feeling as though you have no energy)  Cold intolerance  Weight gain (in spite of normal food intake)  Dry skin  Coarse hair  Menstrual irregularity (if severe, may lead to infertility)  Slowing of thought processes Cardiac problems are also caused by insufficient amounts of thyroid hormone. Hypothyroidism in the newborn is cretinism, and is an extreme form. It is important that this form be treated adequately and immediately or it will lead rapidly to retarded physical and mental development. DIAGNOSIS  To prove hypothyroidism, your caregiver may do blood tests and ultrasound tests. Sometimes the signs are hidden. It may be necessary for your caregiver to watch this illness with blood tests either before or after diagnosis and treatment. TREATMENT  Low levels of thyroid hormone are increased by using synthetic thyroid hormone. This is a safe, effective treatment. It usually takes about four weeks to gain the full effects of the medication. After you have the full effect of the medication, it will generally take another four weeks for problems to leave. Your caregiver may start you on low doses. If you have had heart problems the dose may be gradually increased. It is generally not an emergency to get rapidly to normal. HOME CARE INSTRUCTIONS   Take your  medications as your caregiver suggests. Let your caregiver know of any medications you are taking or start taking. Your caregiver will help you with dosage schedules.  As your condition improves, your dosage needs may increase. It will be necessary to have continuing blood tests as suggested by your caregiver.  Report all suspected medication side effects to your caregiver. SEEK MEDICAL CARE IF: Seek medical care if you develop:  Sweating.  Tremulousness (tremors).  Anxiety.  Rapid weight loss.  Heat intolerance.  Emotional swings.  Diarrhea.  Weakness. SEEK IMMEDIATE MEDICAL CARE IF:  You develop chest pain, an irregular heart beat (palpitations), or a rapid heart beat. MAKE SURE YOU:   Understand these instructions.  Will watch your condition.  Will get help right away if you are not doing well or get worse. Document Released: 05/11/2005 Document Revised: 08/03/2011 Document Reviewed: 12/30/2007 [facility] Patient Information 2014 [place]. Preventive Care for Adults, Female A healthy lifestyle and preventive care can promote health and wellness. Preventive health guidelines for women include the following key practices.  A routine yearly physical is a good way to check with your health care provider about your health and preventive screening. It is a chance to share any concerns and updates on your health and to receive a thorough exam.  Visit your dentist for a routine exam and preventive care every 6 months. Brush your teeth twice a day and floss once a day. Good oral hygiene prevents tooth decay and gum disease.  The frequency of eye exams is based on your age, health, family medical history, use of contact lenses, and other factors. Follow your health care provider's recommendations  for frequency of eye exams.  Eat a healthy diet. Foods like vegetables, fruits, whole grains, low-fat dairy products, and lean protein foods contain the nutrients you need without  too many calories. Decrease your intake of foods high in solid fats, added sugars, and salt. Eat the right amount of calories for you.Get information about a proper diet from your health care provider, if necessary.  Regular physical exercise is one of the most important things you can do for your health. Most adults should get at least 150 minutes of moderate-intensity exercise (any activity that increases your heart rate and causes you to sweat) each week. In addition, most adults need muscle-strengthening exercises on 2 or more days a week.  Maintain a healthy weight. The body mass index (BMI) is a screening tool to identify possible weight problems. It provides an estimate of body fat based on height and weight. Your health care provider can find your BMI, and can help you achieve or maintain a healthy weight.For adults 20 years and older:  A BMI below 18.5 is considered underweight.  A BMI of 18.5 to 24.9 is normal.  A BMI of 25 to 29.9 is considered overweight.  A BMI of 30 and above is considered obese.  Maintain normal blood lipids and cholesterol levels by exercising and minimizing your intake of saturated fat. Eat a balanced diet with plenty of fruit and vegetables. Blood tests for lipids and cholesterol should begin at age 31 and be repeated every 5 years. If your lipid or cholesterol levels are high, you are over 50, or you are at high risk for heart disease, you may need your cholesterol levels checked more frequently.Ongoing high lipid and cholesterol levels should be treated with medicines if diet and exercise are not working.  If you smoke, find out from your health care provider how to quit. If you do not use tobacco, do not start.  Lung cancer screening is recommended for adults aged 70 80 years who are at high risk for developing lung cancer because of a history of smoking. A yearly low-dose CT scan of the lungs is recommended for people who have at least a 30-pack-year  history of smoking and are a current smoker or have quit within the past 15 years. A pack year of smoking is smoking an average of 1 pack of cigarettes a day for 1 year (for example: 1 pack a day for 30 years or 2 packs a day for 15 years). Yearly screening should continue until the smoker has stopped smoking for at least 15 years. Yearly screening should be stopped for people who develop a health problem that would prevent them from having lung cancer treatment.  If you are pregnant, do not drink alcohol. If you are breastfeeding, be very cautious about drinking alcohol. If you are not pregnant and choose to drink alcohol, do not have more than 1 drink per day. One drink is considered to be 12 ounces (355 mL) of beer, 5 ounces (148 mL) of wine, or 1.5 ounces (44 mL) of liquor.  Avoid use of street drugs. Do not share needles with anyone. Ask for help if you need support or instructions about stopping the use of drugs.  High blood pressure causes heart disease and increases the risk of stroke. Your blood pressure should be checked at least every 1 to 2 years. Ongoing high blood pressure should be treated with medicines if weight loss and exercise do not work.  If you are 55 53  years old, ask your health care provider if you should take aspirin to prevent strokes.  Diabetes screening involves taking a blood sample to check your fasting blood sugar level. This should be done once every 3 years, after age 29, if you are within normal weight and without risk factors for diabetes. Testing should be considered at a younger age or be carried out more frequently if you are overweight and have at least 1 risk factor for diabetes.  Breast cancer screening is essential preventive care for women. You should practice "breast self-awareness." This means understanding the normal appearance and feel of your breasts and may include breast self-examination. Any changes detected, no matter how small, should be reported to  a health care provider. Women in their 61s and 30s should have a clinical breast exam (CBE) by a health care provider as part of a regular health exam every 1 to 3 years. After age 36, women should have a CBE every year. Starting at age 33, women should consider having a mammogram (breast X-ray test) every year. Women who have a family history of breast cancer should talk to their health care provider about genetic screening. Women at a high risk of breast cancer should talk to their health care providers about having an MRI and a mammogram every year.  Breast cancer gene (BRCA)-related cancer risk assessment is recommended for women who have family members with BRCA-related cancers. BRCA-related cancers include breast, ovarian, tubal, and peritoneal cancers. Having family members with these cancers may be associated with an increased risk for harmful changes (mutations) in the breast cancer genes BRCA1 and BRCA2. Results of the assessment will determine the need for genetic counseling and BRCA1 and BRCA2 testing.  The Pap test is a screening test for cervical cancer. A Pap test can show cell changes on the cervix that might become cervical cancer if left untreated. A Pap test is a procedure in which cells are obtained and examined from the lower end of the uterus (cervix).  Women should have a Pap test starting at age 19.  Between ages 55 and 77, Pap tests should be repeated every 2 years.  Beginning at age 107, you should have a Pap test every 3 years as long as the past 3 Pap tests have been normal.  Some women have medical problems that increase the chance of getting cervical cancer. Talk to your health care provider about these problems. It is especially important to talk to your health care provider if a new problem develops soon after your last Pap test. In these cases, your health care provider may recommend more frequent screening and Pap tests.  The above recommendations are the same for women  who have or have not gotten the vaccine for human papillomavirus (HPV).  If you had a hysterectomy for a problem that was not cancer or a condition that could lead to cancer, then you no longer need Pap tests. Even if you no longer need a Pap test, a regular exam is a good idea to make sure no other problems are starting.  If you are between ages 38 and 73 years, and you have had normal Pap tests going back 10 years, you no longer need Pap tests. Even if you no longer need a Pap test, a regular exam is a good idea to make sure no other problems are starting.  If you have had past treatment for cervical cancer or a condition that could lead to cancer, you need Pap  tests and screening for cancer for at least 20 years after your treatment.  If Pap tests have been discontinued, risk factors (such as a new sexual partner) need to be reassessed to determine if screening should be resumed.  The HPV test is an additional test that may be used for cervical cancer screening. The HPV test looks for the virus that can cause the cell changes on the cervix. The cells collected during the Pap test can be tested for HPV. The HPV test could be used to screen women aged 17 years and older, and should be used in women of any age who have unclear Pap test results. After the age of 17, women should have HPV testing at the same frequency as a Pap test.  Colorectal cancer can be detected and often prevented. Most routine colorectal cancer screening begins at the age of 69 years and continues through age 84 years. However, your health care provider may recommend screening at an earlier age if you have risk factors for colon cancer. On a yearly basis, your health care provider may provide home test kits to check for hidden blood in the stool. Use of a small camera at the end of a tube, to directly examine the colon (sigmoidoscopy or colonoscopy), can detect the earliest forms of colorectal cancer. Talk to your health care  provider about this at age 44, when routine screening begins. Direct exam of the colon should be repeated every 5 10 years through age 110 years, unless early forms of pre-cancerous polyps or small growths are found.  People who are at an increased risk for hepatitis B should be screened for this virus. You are considered at high risk for hepatitis B if:  You were born in a country where hepatitis B occurs often. Talk with your health care provider about which countries are considered high risk.  Your parents were born in a high-risk country and you have not received a shot to protect against hepatitis B (hepatitis B vaccine).  You have HIV or AIDS.  You use needles to inject street drugs.  You live with, or have sex with, someone who has Hepatitis B.  You get hemodialysis treatment.  You take certain medicines for conditions like cancer, organ transplantation, and autoimmune conditions.  Hepatitis C blood testing is recommended for all people born from [birth year] through 1965 and any individual with known risks for hepatitis C.  Practice safe sex. Use condoms and avoid high-risk sexual practices to reduce the spread of sexually transmitted infections (STIs). STIs include gonorrhea, chlamydia, syphilis, trichomonas, herpes, HPV, and human immunodeficiency virus (HIV). Herpes, HIV, and HPV are viral illnesses that have no cure. They can result in disability, cancer, and death. Sexually active women aged 56 years and younger should be checked for chlamydia. Older women with new or multiple partners should also be tested for chlamydia. Testing for other STIs is recommended if you are sexually active and at increased risk.  Osteoporosis is a disease in which the bones lose minerals and strength with aging. This can result in serious bone fractures or breaks. The risk of osteoporosis can be identified using a bone density scan. Women ages 52 years and over and women at risk for fractures or osteoporosis  should discuss screening with their health care providers. Ask your health care provider whether you should take a calcium supplement or vitamin D to reduce the rate of osteoporosis.  Menopause can be associated with physical symptoms and risks. Hormone replacement therapy  is available to decrease symptoms and risks. You should talk to your health care provider about whether hormone replacement therapy is right for you.  Use sunscreen. Apply sunscreen liberally and repeatedly throughout the day. You should seek shade when your shadow is shorter than you. Protect yourself by wearing long sleeves, pants, a wide-brimmed hat, and sunglasses year round, whenever you are outdoors.  Once a month, do a whole body skin exam, using a mirror to look at the skin on your back. Tell your health care provider of new moles, moles that have irregular borders, moles that are larger than a pencil eraser, or moles that have changed in shape or color.  Stay current with required vaccines (immunizations).  Influenza vaccine. All adults should be immunized every year.  Tetanus, diphtheria, and acellular pertussis (Td, Tdap) vaccine. Pregnant women should receive 1 dose of Tdap vaccine during each pregnancy. The dose should be obtained regardless of the length of time since the last dose. Immunization is preferred during the 27th 36th week of gestation. An adult who has not previously received Tdap or who does not know her vaccine status should receive 1 dose of Tdap. This initial dose should be followed by tetanus and diphtheria toxoids (Td) booster doses every 10 years. Adults with an unknown or incomplete history of completing a 3-dose immunization series with Td-containing vaccines should begin or complete a primary immunization series including a Tdap dose. Adults should receive a Td booster every 10 years.  Varicella vaccine. An adult without evidence of immunity to varicella should receive 2 doses or a second dose if  she has previously received 1 dose. Pregnant females who do not have evidence of immunity should receive the first dose after pregnancy. This first dose should be obtained before leaving the health care facility. The second dose should be obtained 4 8 weeks after the first dose.  Human papillomavirus (HPV) vaccine. Females aged 29 26 years who have not received the vaccine previously should obtain the 3-dose series. The vaccine is not recommended for use in pregnant females. However, pregnancy testing is not needed before receiving a dose. If a female is found to be pregnant after receiving a dose, no treatment is needed. In that case, the remaining doses should be delayed until after the pregnancy. Immunization is recommended for any person with an immunocompromised condition through the age of 81 years if she did not get any or all doses earlier. During the 3-dose series, the second dose should be obtained 4 8 weeks after the first dose. The third dose should be obtained 24 weeks after the first dose and 16 weeks after the second dose.  Zoster vaccine. One dose is recommended for adults aged 21 years or older unless certain conditions are present.  Measles, mumps, and rubella (MMR) vaccine. Adults born before [birth year] generally are considered immune to measles and mumps. Adults born in [birth year] or later should have 1 or more doses of MMR vaccine unless there is a contraindication to the vaccine or there is laboratory evidence of immunity to each of the three diseases. A routine second dose of MMR vaccine should be obtained at least 28 days after the first dose for students attending postsecondary schools, health care workers, or international travelers. People who received inactivated measles vaccine or an unknown type of measles vaccine during 1963 1967 should receive 2 doses of MMR vaccine. People who received inactivated mumps vaccine or an unknown type of mumps vaccine before 1979 and are at high  risk for mumps  infection should consider immunization with 2 doses of MMR vaccine. For females of childbearing age, rubella immunity should be determined. If there is no evidence of immunity, females who are not pregnant should be vaccinated. If there is no evidence of immunity, females who are pregnant should delay immunization until after pregnancy. Unvaccinated health care workers born before [birth year] who lack laboratory evidence of measles, mumps, or rubella immunity or laboratory confirmation of disease should consider measles and mumps immunization with 2 doses of MMR vaccine or rubella immunization with 1 dose of MMR vaccine.  Pneumococcal 13-valent conjugate (PCV13) vaccine. When indicated, a person who is uncertain of her immunization history and has no record of immunization should receive the PCV13 vaccine. An adult aged 45 years or older who has certain medical conditions and has not been previously immunized should receive 1 dose of PCV13 vaccine. This PCV13 should be followed with a dose of pneumococcal polysaccharide (PPSV23) vaccine. The PPSV23 vaccine dose should be obtained at least 8 weeks after the dose of PCV13 vaccine. An adult aged 42 years or older who has certain medical conditions and previously received 1 or more doses of PPSV23 vaccine should receive 1 dose of PCV13. The PCV13 vaccine dose should be obtained 1 or more years after the last PPSV23 vaccine dose.  Pneumococcal polysaccharide (PPSV23) vaccine. When PCV13 is also indicated, PCV13 should be obtained first. All adults aged 67 years and older should be immunized. An adult younger than age 1 years who has certain medical conditions should be immunized. Any person who resides in a nursing home or long-term care facility should be immunized. An adult smoker should be immunized. People with an immunocompromised condition and certain other conditions should receive both PCV13 and PPSV23 vaccines. People with human immunodeficiency virus (HIV)  infection should be immunized as soon as possible after diagnosis. Immunization during chemotherapy or radiation therapy should be avoided. Routine use of PPSV23 vaccine is not recommended for American Indians, [place] Natives, or people younger than 65 years unless there are medical conditions that require PPSV23 vaccine. When indicated, people who have unknown immunization and have no record of immunization should receive PPSV23 vaccine. One-time revaccination 5 years after the first dose of PPSV23 is recommended for people aged 11 64 years who have chronic kidney failure, nephrotic syndrome, asplenia, or immunocompromised conditions. People who received 1 2 doses of PPSV23 before age 70 years should receive another dose of PPSV23 vaccine at age 39 years or later if at least 5 years have passed since the previous dose. Doses of PPSV23 are not needed for people immunized with PPSV23 at or after age 59 years.  Meningococcal vaccine. Adults with asplenia or persistent complement component deficiencies should receive 2 doses of quadrivalent meningococcal conjugate (MenACWY-D) vaccine. The doses should be obtained at least 2 months apart. Microbiologists working with certain meningococcal bacteria, [place] recruits, people at risk during an outbreak, and people who travel to or live in countries with a high rate of meningitis should be immunized. A first-year college student up through age 37 years who is living in a residence hall should receive a dose if she did not receive a dose on or after her 16th birthday. Adults who have certain high-risk conditions should receive one or more doses of vaccine.  Hepatitis A vaccine. Adults who wish to be protected from this disease, have certain high-risk conditions, work with hepatitis A-infected animals, work in hepatitis A research labs, or travel to or work  in countries with a high rate of hepatitis A should be immunized. Adults who were previously unvaccinated and who  anticipate close contact with an international adoptee during the first 60 days after arrival in the [place] from a country with a high rate of hepatitis A should be immunized.  Hepatitis B vaccine. Adults who wish to be protected from this disease, have certain high-risk conditions, may be exposed to blood or other infectious body fluids, are household contacts or sex partners of hepatitis B positive people, are clients or workers in certain care facilities, or travel to or work in countries with a high rate of hepatitis B should be immunized.  Haemophilus influenzae type b (Hib) vaccine. A previously unvaccinated person with asplenia or sickle cell disease or having a scheduled splenectomy should receive 1 dose of Hib vaccine. Regardless of previous immunization, a recipient of a hematopoietic stem cell transplant should receive a 3-dose series 6 12 months after her successful transplant. Hib vaccine is not recommended for adults with HIV infection. Preventive Services / Frequency Ages 70 to 39years  Blood pressure check.** / Every 1 to 2 years.  Lipid and cholesterol check.** / Every 5 years beginning at age 64.  Clinical breast exam.** / Every 3 years for women in their 59s and 23s.  BRCA-related cancer risk assessment.** / For women who have family members with a BRCA-related cancer (breast, ovarian, tubal, or peritoneal cancers).  Pap test.** / Every 2 years from ages 73 through 66. Every 3 years starting at age 58 through age 76 or 15 with a history of 3 consecutive normal Pap tests.  HPV screening.** / Every 3 years from ages 53 through ages 82 to 77 with a history of 3 consecutive normal Pap tests.  Hepatitis C blood test.** / For any individual with known risks for hepatitis C.  Skin self-exam. / Monthly.  Influenza vaccine. / Every year.  Tetanus, diphtheria, and acellular pertussis (Tdap, Td) vaccine.** / Consult your health care provider. Pregnant women should receive 1  dose of Tdap vaccine during each pregnancy. 1 dose of Td every 10 years.  Varicella vaccine.** / Consult your health care provider. Pregnant females who do not have evidence of immunity should receive the first dose after pregnancy.  HPV vaccine. / 3 doses over 6 months, if 62 and younger. The vaccine is not recommended for use in pregnant females. However, pregnancy testing is not needed before receiving a dose.  Measles, mumps, rubella (MMR) vaccine.** / You need at least 1 dose of MMR if you were born in 1957 or later. You may also need a 2nd dose. For females of childbearing age, rubella immunity should be determined. If there is no evidence of immunity, females who are not pregnant should be vaccinated. If there is no evidence of immunity, females who are pregnant should delay immunization until after pregnancy.  Pneumococcal 13-valent conjugate (PCV13) vaccine.** / Consult your health care provider.  Pneumococcal polysaccharide (PPSV23) vaccine.** / 1 to 2 doses if you smoke cigarettes or if you have certain conditions.  Meningococcal vaccine.** / 1 dose if you are age 12 to 58 years and a Market researcher living in a residence hall, or have one of several medical conditions, you need to get vaccinated against meningococcal disease. You may also need additional booster doses.  Hepatitis A vaccine.** / Consult your health care provider.  Hepatitis B vaccine.** / Consult your health care provider.  Haemophilus influenzae type b (Hib) vaccine.** / Consult  your health care provider. Ages 34 to 64years  Blood pressure check.** / Every 1 to 2 years.  Lipid and cholesterol check.** / Every 5 years beginning at age 79 years.  Lung cancer screening. / Every year if you are aged 50 80 years and have a 30-pack-year history of smoking and currently smoke or have quit within the past 15 years. Yearly screening is stopped once you have quit smoking for at least 15 years or develop a health  problem that would prevent you from having lung cancer treatment.  Clinical breast exam.** / Every year after age 77 years.  BRCA-related cancer risk assessment.** / For women who have family members with a BRCA-related cancer (breast, ovarian, tubal, or peritoneal cancers).  Mammogram.** / Every year beginning at age 75 years and continuing for as long as you are in good health. Consult with your health care provider.  Pap test.** / Every 3 years starting at age 37 years through age 12 or 55 years with a history of 3 consecutive normal Pap tests.  HPV screening.** / Every 3 years from ages 77 years through ages 44 to 65 years with a history of 3 consecutive normal Pap tests.  Fecal occult blood test (FOBT) of stool. / Every year beginning at age 63 years and continuing until age 28 years. You may not need to do this test if you get a colonoscopy every 10 years.  Flexible sigmoidoscopy or colonoscopy.** / Every 5 years for a flexible sigmoidoscopy or every 10 years for a colonoscopy beginning at age 29 years and continuing until age 33 years.  Hepatitis C blood test.** / For all people born from [birth year] through 1965 and any individual with known risks for hepatitis C.  Skin self-exam. / Monthly.  Influenza vaccine. / Every year.  Tetanus, diphtheria, and acellular pertussis (Tdap/Td) vaccine.** / Consult your health care provider. Pregnant women should receive 1 dose of Tdap vaccine during each pregnancy. 1 dose of Td every 10 years.  Varicella vaccine.** / Consult your health care provider. Pregnant females who do not have evidence of immunity should receive the first dose after pregnancy.  Zoster vaccine.** / 1 dose for adults aged 48 years or older.  Measles, mumps, rubella (MMR) vaccine.** / You need at least 1 dose of MMR if you were born in 1957 or later. You may also need a 2nd dose. For females of childbearing age, rubella immunity should be determined. If there is no evidence of  immunity, females who are not pregnant should be vaccinated. If there is no evidence of immunity, females who are pregnant should delay immunization until after pregnancy.  Pneumococcal 13-valent conjugate (PCV13) vaccine.** / Consult your health care provider.  Pneumococcal polysaccharide (PPSV23) vaccine.** / 1 to 2 doses if you smoke cigarettes or if you have certain conditions.  Meningococcal vaccine.** / Consult your health care provider.  Hepatitis A vaccine.** / Consult your health care provider.  Hepatitis B vaccine.** / Consult your health care provider.  Haemophilus influenzae type b (Hib) vaccine.** / Consult your health care provider. Ages 14 years and over  Blood pressure check.** / Every 1 to 2 years.  Lipid and cholesterol check.** / Every 5 years beginning at age 79 years.  Lung cancer screening. / Every year if you are aged 34 80 years and have a 30-pack-year history of smoking and currently smoke or have quit within the past 15 years. Yearly screening is stopped once you have quit smoking for at least  15 years or develop a health problem that would prevent you from having lung cancer treatment.  Clinical breast exam.** / Every year after age 60 years.  BRCA-related cancer risk assessment.** / For women who have family members with a BRCA-related cancer (breast, ovarian, tubal, or peritoneal cancers).  Mammogram.** / Every year beginning at age 24 years and continuing for as long as you are in good health. Consult with your health care provider.  Pap test.** / Every 3 years starting at age 53 years through age 14 or 12 years with 3 consecutive normal Pap tests. Testing can be stopped between 65 and 70 years with 3 consecutive normal Pap tests and no abnormal Pap or HPV tests in the past 10 years.  HPV screening.** / Every 3 years from ages 35 years through ages 42 or 68 years with a history of 3 consecutive normal Pap tests. Testing can be stopped between 65 and 70  years with 3 consecutive normal Pap tests and no abnormal Pap or HPV tests in the past 10 years.  Fecal occult blood test (FOBT) of stool. / Every year beginning at age 55 years and continuing until age 84 years. You may not need to do this test if you get a colonoscopy every 10 years.  Flexible sigmoidoscopy or colonoscopy.** / Every 5 years for a flexible sigmoidoscopy or every 10 years for a colonoscopy beginning at age 88 years and continuing until age 42 years.  Hepatitis C blood test.** / For all people born from [birth year] through 1965 and any individual with known risks for hepatitis C.  Osteoporosis screening.** / A one-time screening for women ages 74 years and over and women at risk for fractures or osteoporosis.  Skin self-exam. / Monthly.  Influenza vaccine. / Every year.  Tetanus, diphtheria, and acellular pertussis (Tdap/Td) vaccine.** / 1 dose of Td every 10 years.  Varicella vaccine.** / Consult your health care provider.  Zoster vaccine.** / 1 dose for adults aged 41 years or older.  Pneumococcal 13-valent conjugate (PCV13) vaccine.** / Consult your health care provider.  Pneumococcal polysaccharide (PPSV23) vaccine.** / 1 dose for all adults aged 59 years and older.  Meningococcal vaccine.** / Consult your health care provider.  Hepatitis A vaccine.** / Consult your health care provider.  Hepatitis B vaccine.** / Consult your health care provider.  Haemophilus influenzae type b (Hib) vaccine.** / Consult your health care provider. ** Family history and personal history of risk and conditions may change your health care provider's recommendations. Document Released: 07/07/2001 Document Revised: 03/01/2013 Document Reviewed: 10/06/2010 [facility] Patient Information 2014 [place], [place].

## 2013-10-04 NOTE — Progress Notes (Signed)
Pre visit review using our clinic review tool, if applicable. No additional management support is needed unless otherwise documented below in the visit note. 

## 2013-11-20 ENCOUNTER — Other Ambulatory Visit: Payer: Self-pay | Admitting: Internal Medicine

## 2014-01-28 ENCOUNTER — Other Ambulatory Visit: Payer: Self-pay | Admitting: Internal Medicine

## 2014-02-26 ENCOUNTER — Other Ambulatory Visit: Payer: Self-pay

## 2014-02-26 MED ORDER — AMLODIPINE BESYLATE 5 MG PO TABS: ORAL_TABLET | ORAL | Status: DC

## 2014-02-26 MED ORDER — ATORVASTATIN CALCIUM 80 MG PO TABS: ORAL_TABLET | ORAL | Status: DC

## 2014-03-09 ENCOUNTER — Other Ambulatory Visit: Payer: Self-pay

## 2014-04-10 ENCOUNTER — Ambulatory Visit (INDEPENDENT_AMBULATORY_CARE_PROVIDER_SITE_OTHER): Payer: Managed Care, Other (non HMO) | Admitting: Family Medicine

## 2014-04-10 VITALS — BP 124/80 | HR 101 | Temp 99.0°F | Resp 18 | Wt 205.0 lb

## 2014-04-10 DIAGNOSIS — R1013 Epigastric pain: Secondary | ICD-10-CM

## 2014-04-10 DIAGNOSIS — R197 Diarrhea, unspecified: Secondary | ICD-10-CM

## 2014-04-10 DIAGNOSIS — R112 Nausea with vomiting, unspecified: Secondary | ICD-10-CM

## 2014-04-10 DIAGNOSIS — R1030 Lower abdominal pain, unspecified: Secondary | ICD-10-CM

## 2014-04-10 LAB — POCT CBC
GRANULOCYTE PERCENT: 93.5 % — AB (ref 37–80)
HCT, POC: 51.7 % — AB (ref 37.7–47.9)
Hemoglobin: 17.2 g/dL — AB (ref 12.2–16.2)
Lymph, poc: 0.6 (ref 0.6–3.4)
MCH: 30.7 pg (ref 27–31.2)
MCHC: 33.3 g/dL (ref 31.8–35.4)
MCV: 92.2 fL (ref 80–97)
MID (CBC): 0.3 (ref 0–0.9)
MPV: 8.8 fL (ref 0–99.8)
PLATELET COUNT, POC: 200 10*3/uL (ref 142–424)
POC Granulocyte: 13.1 — AB (ref 2–6.9)
POC LYMPH PERCENT: 4 %L — AB (ref 10–50)
POC MID %: 2.5 %M (ref 0–12)
RBC: 5.61 M/uL — AB (ref 4.04–5.48)
RDW, POC: 12.7 %
WBC: 14 10*3/uL — AB (ref 4.6–10.2)

## 2014-04-10 LAB — POCT URINALYSIS DIPSTICK
BILIRUBIN UA: NEGATIVE
Glucose, UA: NEGATIVE
KETONES UA: NEGATIVE
LEUKOCYTES UA: NEGATIVE
Nitrite, UA: NEGATIVE
PROTEIN UA: NEGATIVE
Spec Grav, UA: 1.02
Urobilinogen, UA: 0.2
pH, UA: 5.5

## 2014-04-10 LAB — POCT UA - MICROSCOPIC ONLY
Casts, Ur, LPF, POC: NEGATIVE
Crystals, Ur, HPF, POC: NEGATIVE
Mucus, UA: NEGATIVE
Yeast, UA: NEGATIVE

## 2014-04-10 MED ORDER — ONDANSETRON 8 MG PO TBDP: 8.0000 mg | ORAL_TABLET | Freq: Three times a day (TID) | ORAL | Status: DC | PRN

## 2014-04-10 MED ORDER — GI COCKTAIL ~~LOC~~: 30.0000 mL | Freq: Once | ORAL | Status: DC

## 2014-04-10 NOTE — Patient Instructions (Signed)
Your symptoms are most likely due to a viral gastroenteritis.  Your white blood cell count was elevated today meaning you likely have an infection. We'll let you know the results of your other labs that we drew today.  Your UA today was negative for a UTI.  We've prescribed zofran. Please take it every 8 hours as needed for nausea/vomiting. If your nausea/vomiting/diarrhea/abdominal pain are not resolving in 24 hours, please return to clinic.

## 2014-04-10 NOTE — Progress Notes (Signed)
Subjective:    Patient ID: Adriana Young, female    DOB: March 01, 1961, 53 y.o.   MRN: [id number]  Adriana Calico, MD  Chief Complaint  Patient presents with  . Emesis    started today  . Diarrhea  . Nausea   Patient Active Problem List   Diagnosis Date Noted  . Fatty liver 09/19/2010  . PERIMENOPAUSAL STATUS 11/05/2008  . HYPOTHYROIDISM 06/18/2008  . Hyperlipidemia LDL goal < 100 06/04/2008  . HYPERTENSION 06/04/2008  . GERD 06/04/2008  . OSTEOARTHRITIS 06/04/2008    Prior to Admission medications   Medication Sig Start Date End Date Taking? Authorizing Provider  amLODipine (NORVASC) 5 MG tablet TAKE 1 TABLET (5 MG TOTAL) BY MOUTH DAILY. 02/26/14  Yes Olga Millers, MD  atorvastatin (LIPITOR) 80 MG tablet TAKE 1 TABLET (80 MG TOTAL) BY MOUTH DAILY. 02/26/14  Yes Olga Millers, MD  indomethacin (INDOCIN) 50 MG capsule TAKE 1 CAPSULE BY MOUTH 3 TIMES DAILY WITH MEALS. 01/29/14  Yes Janith Lima, MD  levonorgestrel-ethinyl estradiol (ORSYTHIA) 0.1-20 MG-MCG tablet Take 1 tablet by mouth daily.   Yes Historical Provider, MD  levothyroxine (SYNTHROID, LEVOTHROID) 125 MCG tablet TAKE ONE TABLET BY MOUTH ONCE DAILY 11/20/13  Yes Janith Lima, MD  losartan (COZAAR) 100 MG tablet TAKE 1 TABLET (100 MG TOTAL) BY MOUTH DAILY. 06/01/13  Yes Janith Lima, MD  Multiple Vitamin (MULTIVITAMIN) tablet Take 1 tablet by mouth daily.     Yes Historical Provider, MD   Medications, allergies, past medical history, surgical history, family history, social history and problem list reviewed and updated.  HPI  53 yof with PMH HTN, hyperlipidemia, and distant GERD presents today with nausea, vomiting, and diarrhea.   She awoke this am with a bout of diarrhea. She felt somewhat upset to her stomach, took a pepto, and went grocery shopping. She ate a bagel for breakfast. While shopping she felt nauseated and vomited. She has continued to feel nauseated throughout the day. Has had total of 6 vomiting  episodes. All non bloody, non bilious. Total of at least 10 diarrhea episodes today. All non bloody. She has had epigastric and suprapubic  pain the past few hours. Both are nonradiating. Epigastric is 3/10, suprapubic is 8/10. Both are constant. She has not been able to keep anything down today including fluids. Feeling slighly weak and dizzy in clinic. Denies tenesmus.   She ate several salads yest but this is normal for her. Denies any sick contacts and nobody else who ate the salads with her has become sick.   Currently perimenopausal. Taking birth control as prescribed.   Diagnosed with gerd yrs ago. She states she only rarely has GERD like sx and they resolve with antacids. Hx fatty liver dz diagnosed several yrs ago. She states she has not heard anything about this from her PCP for yrs.   HR slightly elevated today at 101 bpm.  Review of Systems See HPI.     Objective:   Physical Exam  Constitutional: She is oriented to person, place, and time. She appears well-developed and well-nourished.  Non-toxic appearance. She does not have a sickly appearance. She appears ill. No distress.  BP 124/80 mmHg  Pulse 101  Temp(Src) 99 F (37.2 C) (Oral)  Resp 18  Wt 205 lb (92.987 kg)  SpO2 98%  LMP    HENT:  Head: Normocephalic and atraumatic.  Neck: No Brudzinski's sign noted.  Cardiovascular: Normal rate, regular rhythm, S1 normal, S2 normal and  normal heart sounds.  Exam reveals no gallop.   No murmur heard. Pulmonary/Chest: Effort normal. She has no decreased breath sounds. She has no wheezes. She has no rhonchi. She has no rales.  Abdominal: Soft. Normal appearance and bowel sounds are normal. There is no hepatosplenomegaly. There is tenderness in the epigastric area and suprapubic area. There is no rigidity, no rebound, no guarding, no CVA tenderness, no tenderness at McBurney's point and negative Murphy's sign.  TTP over epigastric and suprapubic areas. No other TTP.   Neurological:  She is alert and oriented to person, place, and time.  Skin: No rash noted.   Results for orders placed or performed in visit on 04/10/14  POCT CBC  Result Value Ref Range   WBC 14.0 (A) 4.6 - 10.2 K/uL   Lymph, poc 0.6 0.6 - 3.4   POC LYMPH PERCENT 4.0 (A) 10 - 50 %L   MID (cbc) 0.3 0 - 0.9   POC MID % 2.5 0 - 12 %M   POC Granulocyte 13.1 (A) 2 - 6.9   Granulocyte percent 93.5 (A) 37 - 80 %G   RBC 5.61 (A) 4.04 - 5.48 M/uL   Hemoglobin 17.2 (A) 12.2 - 16.2 g/dL   HCT, POC 51.7 (A) 37.7 - 47.9 %   MCV 92.2 80 - 97 fL   MCH, POC 30.7 27 - 31.2 pg   MCHC 33.3 31.8 - 35.4 g/dL   RDW, POC 12.7 %   Platelet Count, POC 200 142 - 424 K/uL   MPV 8.8 0 - 99.8 fL  POCT urinalysis dipstick  Result Value Ref Range   Color, UA yellow    Clarity, UA cloudy    Glucose, UA neg    Bilirubin, UA neg    Ketones, UA neg    Spec Grav, UA 1.020    Blood, UA moderate    pH, UA 5.5    Protein, UA neg    Urobilinogen, UA 0.2    Nitrite, UA neg    Leukocytes, UA Negative   POCT UA - Microscopic Only  Result Value Ref Range   WBC, Ur, HPF, POC 0-1    RBC, urine, microscopic 0-3    Bacteria, U Microscopic trace    Mucus, UA neg    Epithelial cells, urine per micros 0-3    Crystals, Ur, HPF, POC neg    Casts, Ur, LPF, POC neg    Yeast, UA neg        Assessment & Plan:   53 yof with PMH HTN, hyperlipidemia, and distant GERD presents today with nausea, vomiting, and diarrhea.   Suprapubic pain, unspecified laterality Abdominal pain, epigastric - Plan: POCT CBC, Comprehensive metabolic panel, Lipase, gi cocktail (Maalox,Lidocaine,Donnatal) --CMP, lipase to r/o low suspicion of pancreatitis, liver, GB cause --UA neg for UTI. Mod blood in urine, blood was present on UA from 2 and 3 yrs ago --Epigastric pain resolved and suprapubic pain decreased with GI cocktail --Likely viral gastroenteritis. No bloody diarrhea, no severe abd pain, no fevers, no painful defecation make bacterial cause less  likely. Leukocytosis on CBC.  --RTC 48 hours if sx not improving  Non-intractable vomiting with nausea, vomiting of unspecified type - Plan: POCT CBC --2L NS given due to pt feeling slightly weak/dizzy, no hx CAD or CHF --Zofran --Rest/fluids as N/V resolves  Diarrhea - Plan: POCT CBC --Kit given for home stool culture --Bring sample in if sx not resolving 24-48 hrs  Julieta Gutting, PA-C Physician Assistant-Certified  [facility]  04/10/2014 5:32 PM

## 2014-04-11 ENCOUNTER — Telehealth: Payer: Self-pay

## 2014-04-11 LAB — COMPREHENSIVE METABOLIC PANEL
ALK PHOS: 67 U/L (ref 39–117)
ALT: 26 U/L (ref 0–35)
AST: 28 U/L (ref 0–37)
Albumin: 4.5 g/dL (ref 3.5–5.2)
BUN: 22 mg/dL (ref 6–23)
CO2: 23 mEq/L (ref 19–32)
CREATININE: 1.2 mg/dL — AB (ref 0.50–1.10)
Calcium: 9.7 mg/dL (ref 8.4–10.5)
Chloride: 101 mEq/L (ref 96–112)
Glucose, Bld: 144 mg/dL — ABNORMAL HIGH (ref 70–99)
Potassium: 4.1 mEq/L (ref 3.5–5.3)
Sodium: 137 mEq/L (ref 135–145)
Total Bilirubin: 0.8 mg/dL (ref 0.2–1.2)
Total Protein: 7.4 g/dL (ref 6.0–8.3)

## 2014-04-11 LAB — LIPASE: LIPASE: 29 U/L (ref 0–75)

## 2014-04-11 NOTE — Telephone Encounter (Signed)
The pt called in and was seen here 11/17 by Dr Marin Comment, she wanted to let us know that she had been up to [place] evening to visit her sister in law who just now was diagnosed with Norovirus. Adriana Young states she is feeling better but just wanted to let us know about her sister in law. She can be reached @ [phone]. Thank you

## 2014-04-11 NOTE — Progress Notes (Signed)
Agree with A/P. TLe

## 2014-05-07 ENCOUNTER — Other Ambulatory Visit: Payer: Self-pay | Admitting: Internal Medicine

## 2014-06-02 ENCOUNTER — Other Ambulatory Visit: Payer: Self-pay | Admitting: Internal Medicine

## 2014-06-23 ENCOUNTER — Other Ambulatory Visit: Payer: Self-pay | Admitting: Internal Medicine

## 2014-11-01 ENCOUNTER — Encounter: Payer: Self-pay | Admitting: Internal Medicine

## 2014-11-01 ENCOUNTER — Ambulatory Visit (INDEPENDENT_AMBULATORY_CARE_PROVIDER_SITE_OTHER): Payer: Managed Care, Other (non HMO) | Admitting: Internal Medicine

## 2014-11-01 ENCOUNTER — Other Ambulatory Visit (INDEPENDENT_AMBULATORY_CARE_PROVIDER_SITE_OTHER): Payer: Managed Care, Other (non HMO)

## 2014-11-01 VITALS — BP 118/86 | HR 60 | Temp 98.6°F | Resp 16 | Ht 68.0 in | Wt 212.0 lb

## 2014-11-01 DIAGNOSIS — Z0000 Encounter for general adult medical examination without abnormal findings: Secondary | ICD-10-CM | POA: Diagnosis not present

## 2014-11-01 DIAGNOSIS — E038 Other specified hypothyroidism: Secondary | ICD-10-CM

## 2014-11-01 DIAGNOSIS — I10 Essential (primary) hypertension: Secondary | ICD-10-CM

## 2014-11-01 DIAGNOSIS — E785 Hyperlipidemia, unspecified: Secondary | ICD-10-CM

## 2014-11-01 LAB — COMPREHENSIVE METABOLIC PANEL
ALK PHOS: 51 U/L (ref 39–117)
ALT: 16 U/L (ref 0–35)
AST: 18 U/L (ref 0–37)
Albumin: 4.2 g/dL (ref 3.5–5.2)
BUN: 14 mg/dL (ref 6–23)
CHLORIDE: 105 meq/L (ref 96–112)
CO2: 28 meq/L (ref 19–32)
CREATININE: 1.03 mg/dL (ref 0.40–1.20)
Calcium: 9.2 mg/dL (ref 8.4–10.5)
GFR: 59.27 mL/min — ABNORMAL LOW (ref >60.00)
GLUCOSE: 94 mg/dL (ref 70–99)
POTASSIUM: 4.3 meq/L (ref 3.5–5.1)
Sodium: 137 mEq/L (ref 135–145)
TOTAL PROTEIN: 7 g/dL (ref 6.0–8.3)
Total Bilirubin: 0.4 mg/dL (ref 0.2–1.2)

## 2014-11-01 LAB — CBC WITH DIFFERENTIAL/PLATELET
Basophils Absolute: 0.1 10*3/uL (ref 0.0–0.1)
Basophils Relative: 2 % (ref 0.0–3.0)
Eosinophils Absolute: 0.1 10*3/uL (ref 0.0–0.7)
Eosinophils Relative: 1.6 % (ref 0.0–5.0)
HCT: 45.8 % (ref 36.0–46.0)
Hemoglobin: 15.8 g/dL — ABNORMAL HIGH (ref 12.0–15.0)
Lymphocytes Relative: 36.7 % (ref 12.0–46.0)
Lymphs Abs: 2.4 10*3/uL (ref 0.7–4.0)
MCHC: 34.5 g/dL (ref 30.0–36.0)
MCV: 91.4 fl (ref 78.0–100.0)
MONOS PCT: 5.8 % (ref 3.0–12.0)
Monocytes Absolute: 0.4 10*3/uL (ref 0.1–1.0)
Neutro Abs: 3.5 10*3/uL (ref 1.4–7.7)
Neutrophils Relative %: 53.9 % (ref 43.0–77.0)
PLATELETS: 210 10*3/uL (ref 150.0–400.0)
RBC: 5.01 Mil/uL (ref 3.87–5.11)
RDW: 13.2 % (ref 11.5–15.5)
WBC: 6.4 10*3/uL (ref 4.0–10.5)

## 2014-11-01 LAB — LIPID PANEL
CHOL/HDL RATIO: 3
CHOLESTEROL: 141 mg/dL (ref 0–200)
HDL: 48.2 mg/dL (ref >39.00)
LDL Cholesterol: 83 mg/dL (ref 0–99)
NONHDL: 92.8
Triglycerides: 51 mg/dL (ref 0.0–149.0)
VLDL: 10.2 mg/dL (ref 0.0–40.0)

## 2014-11-01 LAB — TSH: TSH: 3.3 u[IU]/mL (ref 0.35–4.50)

## 2014-11-01 MED ORDER — LEVOTHYROXINE SODIUM 125 MCG PO TABS
125.0000 ug | ORAL_TABLET | Freq: Every day | ORAL | Status: DC
Start: 2014-11-01 — End: 2015-04-17

## 2014-11-01 NOTE — Progress Notes (Signed)
Pre visit review using our clinic review tool, if applicable. No additional management support is needed unless otherwise documented below in the visit note. 

## 2014-11-01 NOTE — Patient Instructions (Signed)
Preventive Care for Adults A healthy lifestyle and preventive care can promote health and wellness. Preventive health guidelines for women include the following key practices.  A routine yearly physical is a good way to check with your health care provider about your health and preventive screening. It is a chance to share any concerns and updates on your health and to receive a thorough exam.  Visit your dentist for a routine exam and preventive care every 6 months. Brush your teeth twice a day and floss once a day. Good oral hygiene prevents tooth decay and gum disease.  The frequency of eye exams is based on your age, health, family medical history, use of contact lenses, and other factors. Follow your health care provider's recommendations for frequency of eye exams.  Eat a healthy diet. Foods like vegetables, fruits, whole grains, low-fat dairy products, and lean protein foods contain the nutrients you need without too many calories. Decrease your intake of foods high in solid fats, added sugars, and salt. Eat the right amount of calories for you.Get information about a proper diet from your health care provider, if necessary.  Regular physical exercise is one of the most important things you can do for your health. Most adults should get at least 150 minutes of moderate-intensity exercise (any activity that increases your heart rate and causes you to sweat) each week. In addition, most adults need muscle-strengthening exercises on 2 or more days a week.  Maintain a healthy weight. The body mass index (BMI) is a screening tool to identify possible weight problems. It provides an estimate of body fat based on height and weight. Your health care provider can find your BMI and can help you achieve or maintain a healthy weight.For adults 20 years and older:  A BMI below 18.5 is considered underweight.  A BMI of 18.5 to 24.9 is normal.  A BMI of 25 to 29.9 is considered overweight.  A BMI of  30 and above is considered obese.  Maintain normal blood lipids and cholesterol levels by exercising and minimizing your intake of saturated fat. Eat a balanced diet with plenty of fruit and vegetables. Blood tests for lipids and cholesterol should begin at age 76 and be repeated every 5 years. If your lipid or cholesterol levels are high, you are over 50, or you are at high risk for heart disease, you may need your cholesterol levels checked more frequently.Ongoing high lipid and cholesterol levels should be treated with medicines if diet and exercise are not working.  If you smoke, find out from your health care provider how to quit. If you do not use tobacco, do not start.  Lung cancer screening is recommended for adults aged 22-80 years who are at high risk for developing lung cancer because of a history of smoking. A yearly low-dose CT scan of the lungs is recommended for people who have at least a 30-pack-year history of smoking and are a current smoker or have quit within the past 15 years. A pack year of smoking is smoking an average of 1 pack of cigarettes a day for 1 year (for example: 1 pack a day for 30 years or 2 packs a day for 15 years). Yearly screening should continue until the smoker has stopped smoking for at least 15 years. Yearly screening should be stopped for people who develop a health problem that would prevent them from having lung cancer treatment.  If you are pregnant, do not drink alcohol. If you are breastfeeding,  be very cautious about drinking alcohol. If you are not pregnant and choose to drink alcohol, do not have more than 1 drink per day. One drink is considered to be 12 ounces (355 mL) of beer, 5 ounces (148 mL) of wine, or 1.5 ounces (44 mL) of liquor.  Avoid use of street drugs. Do not share needles with anyone. Ask for help if you need support or instructions about stopping the use of drugs.  High blood pressure causes heart disease and increases the risk of  stroke. Your blood pressure should be checked at least every 1 to 2 years. Ongoing high blood pressure should be treated with medicines if weight loss and exercise do not work.  If you are 3-86 years old, ask your health care provider if you should take aspirin to prevent strokes.  Diabetes screening involves taking a blood sample to check your fasting blood sugar level. This should be done once every 3 years, after age 67, if you are within normal weight and without risk factors for diabetes. Testing should be considered at a younger age or be carried out more frequently if you are overweight and have at least 1 risk factor for diabetes.  Breast cancer screening is essential preventive care for women. You should practice "breast self-awareness." This means understanding the normal appearance and feel of your breasts and may include breast self-examination. Any changes detected, no matter how small, should be reported to a health care provider. Women in their 8s and 30s should have a clinical breast exam (CBE) by a health care provider as part of a regular health exam every 1 to 3 years. After age 70, women should have a CBE every year. Starting at age 25, women should consider having a mammogram (breast X-ray test) every year. Women who have a family history of breast cancer should talk to their health care provider about genetic screening. Women at a high risk of breast cancer should talk to their health care providers about having an MRI and a mammogram every year.  Breast cancer gene (BRCA)-related cancer risk assessment is recommended for women who have family members with BRCA-related cancers. BRCA-related cancers include breast, ovarian, tubal, and peritoneal cancers. Having family members with these cancers may be associated with an increased risk for harmful changes (mutations) in the breast cancer genes BRCA1 and BRCA2. Results of the assessment will determine the need for genetic counseling and  BRCA1 and BRCA2 testing.  Routine pelvic exams to screen for cancer are no longer recommended for nonpregnant women who are considered low risk for cancer of the pelvic organs (ovaries, uterus, and vagina) and who do not have symptoms. Ask your health care provider if a screening pelvic exam is right for you.  If you have had past treatment for cervical cancer or a condition that could lead to cancer, you need Pap tests and screening for cancer for at least 20 years after your treatment. If Pap tests have been discontinued, your risk factors (such as having a new sexual partner) need to be reassessed to determine if screening should be resumed. Some women have medical problems that increase the chance of getting cervical cancer. In these cases, your health care provider may recommend more frequent screening and Pap tests.  The HPV test is an additional test that may be used for cervical cancer screening. The HPV test looks for the virus that can cause the cell changes on the cervix. The cells collected during the Pap test can be  tested for HPV. The HPV test could be used to screen women aged 30 years and older, and should be used in women of any age who have unclear Pap test results. After the age of 30, women should have HPV testing at the same frequency as a Pap test.  Colorectal cancer can be detected and often prevented. Most routine colorectal cancer screening begins at the age of 50 years and continues through age 75 years. However, your health care provider may recommend screening at an earlier age if you have risk factors for colon cancer. On a yearly basis, your health care provider may provide home test kits to check for hidden blood in the stool. Use of a small camera at the end of a tube, to directly examine the colon (sigmoidoscopy or colonoscopy), can detect the earliest forms of colorectal cancer. Talk to your health care provider about this at age 50, when routine screening begins. Direct  exam of the colon should be repeated every 5-10 years through age 75 years, unless early forms of pre-cancerous polyps or small growths are found.  People who are at an increased risk for hepatitis B should be screened for this virus. You are considered at high risk for hepatitis B if:  You were born in a country where hepatitis B occurs often. Talk with your health care provider about which countries are considered high risk.  Your parents were born in a high-risk country and you have not received a shot to protect against hepatitis B (hepatitis B vaccine).  You have HIV or AIDS.  You use needles to inject street drugs.  You live with, or have sex with, someone who has hepatitis B.  You get hemodialysis treatment.  You take certain medicines for conditions like cancer, organ transplantation, and autoimmune conditions.  Hepatitis C blood testing is recommended for all people born from 1945 through 1965 and any individual with known risks for hepatitis C.  Practice safe sex. Use condoms and avoid high-risk sexual practices to reduce the spread of sexually transmitted infections (STIs). STIs include gonorrhea, chlamydia, syphilis, trichomonas, herpes, HPV, and human immunodeficiency virus (HIV). Herpes, HIV, and HPV are viral illnesses that have no cure. They can result in disability, cancer, and death.  You should be screened for sexually transmitted illnesses (STIs) including gonorrhea and chlamydia if:  You are sexually active and are younger than 24 years.  You are older than 24 years and your health care provider tells you that you are at risk for this type of infection.  Your sexual activity has changed since you were last screened and you are at an increased risk for chlamydia or gonorrhea. Ask your health care provider if you are at risk.  If you are at risk of being infected with HIV, it is recommended that you take a prescription medicine daily to prevent HIV infection. This is  called preexposure prophylaxis (PrEP). You are considered at risk if:  You are a heterosexual woman, are sexually active, and are at increased risk for HIV infection.  You take drugs by injection.  You are sexually active with a partner who has HIV.  Talk with your health care provider about whether you are at high risk of being infected with HIV. If you choose to begin PrEP, you should first be tested for HIV. You should then be tested every 3 months for as long as you are taking PrEP.  Osteoporosis is a disease in which the bones lose minerals and strength   with aging. This can result in serious bone fractures or breaks. The risk of osteoporosis can be identified using a bone density scan. Women ages 65 years and over and women at risk for fractures or osteoporosis should discuss screening with their health care providers. Ask your health care provider whether you should take a calcium supplement or vitamin D to reduce the rate of osteoporosis.  Menopause can be associated with physical symptoms and risks. Hormone replacement therapy is available to decrease symptoms and risks. You should talk to your health care provider about whether hormone replacement therapy is right for you.  Use sunscreen. Apply sunscreen liberally and repeatedly throughout the day. You should seek shade when your shadow is shorter than you. Protect yourself by wearing long sleeves, pants, a wide-brimmed hat, and sunglasses year round, whenever you are outdoors.  Once a month, do a whole body skin exam, using a mirror to look at the skin on your back. Tell your health care provider of new moles, moles that have irregular borders, moles that are larger than a pencil eraser, or moles that have changed in shape or color.  Stay current with required vaccines (immunizations).  Influenza vaccine. All adults should be immunized every year.  Tetanus, diphtheria, and acellular pertussis (Td, Tdap) vaccine. Pregnant women should  receive 1 dose of Tdap vaccine during each pregnancy. The dose should be obtained regardless of the length of time since the last dose. Immunization is preferred during the 27th-36th week of gestation. An adult who has not previously received Tdap or who does not know her vaccine status should receive 1 dose of Tdap. This initial dose should be followed by tetanus and diphtheria toxoids (Td) booster doses every 10 years. Adults with an unknown or incomplete history of completing a 3-dose immunization series with Td-containing vaccines should begin or complete a primary immunization series including a Tdap dose. Adults should receive a Td booster every 10 years.  Varicella vaccine. An adult without evidence of immunity to varicella should receive 2 doses or a second dose if she has previously received 1 dose. Pregnant females who do not have evidence of immunity should receive the first dose after pregnancy. This first dose should be obtained before leaving the health care facility. The second dose should be obtained 4-8 weeks after the first dose.  Human papillomavirus (HPV) vaccine. Females aged 13-26 years who have not received the vaccine previously should obtain the 3-dose series. The vaccine is not recommended for use in pregnant females. However, pregnancy testing is not needed before receiving a dose. If a female is found to be pregnant after receiving a dose, no treatment is needed. In that case, the remaining doses should be delayed until after the pregnancy. Immunization is recommended for any person with an immunocompromised condition through the age of 26 years if she did not get any or all doses earlier. During the 3-dose series, the second dose should be obtained 4-8 weeks after the first dose. The third dose should be obtained 24 weeks after the first dose and 16 weeks after the second dose.  Zoster vaccine. One dose is recommended for adults aged 60 years or older unless certain conditions are  present.  Measles, mumps, and rubella (MMR) vaccine. Adults born before 1957 generally are considered immune to measles and mumps. Adults born in 1957 or later should have 1 or more doses of MMR vaccine unless there is a contraindication to the vaccine or there is laboratory evidence of immunity to   each of the three diseases. A routine second dose of MMR vaccine should be obtained at least 28 days after the first dose for students attending postsecondary schools, health care workers, or international travelers. People who received inactivated measles vaccine or an unknown type of measles vaccine during 1963-1967 should receive 2 doses of MMR vaccine. People who received inactivated mumps vaccine or an unknown type of mumps vaccine before 1979 and are at high risk for mumps infection should consider immunization with 2 doses of MMR vaccine. For females of childbearing age, rubella immunity should be determined. If there is no evidence of immunity, females who are not pregnant should be vaccinated. If there is no evidence of immunity, females who are pregnant should delay immunization until after pregnancy. Unvaccinated health care workers born before 1957 who lack laboratory evidence of measles, mumps, or rubella immunity or laboratory confirmation of disease should consider measles and mumps immunization with 2 doses of MMR vaccine or rubella immunization with 1 dose of MMR vaccine.  Pneumococcal 13-valent conjugate (PCV13) vaccine. When indicated, a person who is uncertain of her immunization history and has no record of immunization should receive the PCV13 vaccine. An adult aged 19 years or older who has certain medical conditions and has not been previously immunized should receive 1 dose of PCV13 vaccine. This PCV13 should be followed with a dose of pneumococcal polysaccharide (PPSV23) vaccine. The PPSV23 vaccine dose should be obtained at least 8 weeks after the dose of PCV13 vaccine. An adult aged 19  years or older who has certain medical conditions and previously received 1 or more doses of PPSV23 vaccine should receive 1 dose of PCV13. The PCV13 vaccine dose should be obtained 1 or more years after the last PPSV23 vaccine dose.  Pneumococcal polysaccharide (PPSV23) vaccine. When PCV13 is also indicated, PCV13 should be obtained first. All adults aged 65 years and older should be immunized. An adult younger than age 65 years who has certain medical conditions should be immunized. Any person who resides in a nursing home or long-term care facility should be immunized. An adult smoker should be immunized. People with an immunocompromised condition and certain other conditions should receive both PCV13 and PPSV23 vaccines. People with human immunodeficiency virus (HIV) infection should be immunized as soon as possible after diagnosis. Immunization during chemotherapy or radiation therapy should be avoided. Routine use of PPSV23 vaccine is not recommended for American Indians, [place] Natives, or people younger than 65 years unless there are medical conditions that require PPSV23 vaccine. When indicated, people who have unknown immunization and have no record of immunization should receive PPSV23 vaccine. One-time revaccination 5 years after the first dose of PPSV23 is recommended for people aged 19-64 years who have chronic kidney failure, nephrotic syndrome, asplenia, or immunocompromised conditions. People who received 1-2 doses of PPSV23 before age 65 years should receive another dose of PPSV23 vaccine at age 65 years or later if at least 5 years have passed since the previous dose. Doses of PPSV23 are not needed for people immunized with PPSV23 at or after age 65 years.  Meningococcal vaccine. Adults with asplenia or persistent complement component deficiencies should receive 2 doses of quadrivalent meningococcal conjugate (MenACWY-D) vaccine. The doses should be obtained at least 2 months apart.  Microbiologists working with certain meningococcal bacteria, military recruits, people at risk during an outbreak, and people who travel to or live in countries with a high rate of meningitis should be immunized. A first-year college student up through age   21 years who is living in a residence hall should receive a dose if she did not receive a dose on or after her 16th birthday. Adults who have certain high-risk conditions should receive one or more doses of vaccine.  Hepatitis A vaccine. Adults who wish to be protected from this disease, have certain high-risk conditions, work with hepatitis A-infected animals, work in hepatitis A research labs, or travel to or work in countries with a high rate of hepatitis A should be immunized. Adults who were previously unvaccinated and who anticipate close contact with an international adoptee during the first 60 days after arrival in the [place] from a country with a high rate of hepatitis A should be immunized.  Hepatitis B vaccine. Adults who wish to be protected from this disease, have certain high-risk conditions, may be exposed to blood or other infectious body fluids, are household contacts or sex partners of hepatitis B positive people, are clients or workers in certain care facilities, or travel to or work in countries with a high rate of hepatitis B should be immunized.  Haemophilus influenzae type b (Hib) vaccine. A previously unvaccinated person with asplenia or sickle cell disease or having a scheduled splenectomy should receive 1 dose of Hib vaccine. Regardless of previous immunization, a recipient of a hematopoietic stem cell transplant should receive a 3-dose series 6-12 months after her successful transplant. Hib vaccine is not recommended for adults with HIV infection. Preventive Services / Frequency Ages 64 to 68 years  Blood pressure check.** / Every 1 to 2 years.  Lipid and cholesterol check.** / Every 5 years beginning at age  22.  Clinical breast exam.** / Every 3 years for women in their 88s and 53s.  BRCA-related cancer risk assessment.** / For women who have family members with a BRCA-related cancer (breast, ovarian, tubal, or peritoneal cancers).  Pap test.** / Every 2 years from ages 90 through 51. Every 3 years starting at age 21 through age 56 or 3 with a history of 3 consecutive normal Pap tests.  HPV screening.** / Every 3 years from ages 24 through ages 1 to 46 with a history of 3 consecutive normal Pap tests.  Hepatitis C blood test.** / For any individual with known risks for hepatitis C.  Skin self-exam. / Monthly.  Influenza vaccine. / Every year.  Tetanus, diphtheria, and acellular pertussis (Tdap, Td) vaccine.** / Consult your health care provider. Pregnant women should receive 1 dose of Tdap vaccine during each pregnancy. 1 dose of Td every 10 years.  Varicella vaccine.** / Consult your health care provider. Pregnant females who do not have evidence of immunity should receive the first dose after pregnancy.  HPV vaccine. / 3 doses over 6 months, if 72 and younger. The vaccine is not recommended for use in pregnant females. However, pregnancy testing is not needed before receiving a dose.  Measles, mumps, rubella (MMR) vaccine.** / You need at least 1 dose of MMR if you were born in 1957 or later. You may also need a 2nd dose. For females of childbearing age, rubella immunity should be determined. If there is no evidence of immunity, females who are not pregnant should be vaccinated. If there is no evidence of immunity, females who are pregnant should delay immunization until after pregnancy.  Pneumococcal 13-valent conjugate (PCV13) vaccine.** / Consult your health care provider.  Pneumococcal polysaccharide (PPSV23) vaccine.** / 1 to 2 doses if you smoke cigarettes or if you have certain conditions.  Meningococcal vaccine.** /  1 dose if you are age 19 to 21 years and a first-year college  student living in a residence hall, or have one of several medical conditions, you need to get vaccinated against meningococcal disease. You may also need additional booster doses.  Hepatitis A vaccine.** / Consult your health care provider.  Hepatitis B vaccine.** / Consult your health care provider.  Haemophilus influenzae type b (Hib) vaccine.** / Consult your health care provider. Ages 40 to 64 years  Blood pressure check.** / Every 1 to 2 years.  Lipid and cholesterol check.** / Every 5 years beginning at age 20 years.  Lung cancer screening. / Every year if you are aged 55-80 years and have a 30-pack-year history of smoking and currently smoke or have quit within the past 15 years. Yearly screening is stopped once you have quit smoking for at least 15 years or develop a health problem that would prevent you from having lung cancer treatment.  Clinical breast exam.** / Every year after age 40 years.  BRCA-related cancer risk assessment.** / For women who have family members with a BRCA-related cancer (breast, ovarian, tubal, or peritoneal cancers).  Mammogram.** / Every year beginning at age 40 years and continuing for as long as you are in good health. Consult with your health care provider.  Pap test.** / Every 3 years starting at age 30 years through age 65 or 70 years with a history of 3 consecutive normal Pap tests.  HPV screening.** / Every 3 years from ages 30 years through ages 65 to 70 years with a history of 3 consecutive normal Pap tests.  Fecal occult blood test (FOBT) of stool. / Every year beginning at age 50 years and continuing until age 75 years. You may not need to do this test if you get a colonoscopy every 10 years.  Flexible sigmoidoscopy or colonoscopy.** / Every 5 years for a flexible sigmoidoscopy or every 10 years for a colonoscopy beginning at age 50 years and continuing until age 75 years.  Hepatitis C blood test.** / For all people born from 1945 through  1965 and any individual with known risks for hepatitis C.  Skin self-exam. / Monthly.  Influenza vaccine. / Every year.  Tetanus, diphtheria, and acellular pertussis (Tdap/Td) vaccine.** / Consult your health care provider. Pregnant women should receive 1 dose of Tdap vaccine during each pregnancy. 1 dose of Td every 10 years.  Varicella vaccine.** / Consult your health care provider. Pregnant females who do not have evidence of immunity should receive the first dose after pregnancy.  Zoster vaccine.** / 1 dose for adults aged 60 years or older.  Measles, mumps, rubella (MMR) vaccine.** / You need at least 1 dose of MMR if you were born in 1957 or later. You may also need a 2nd dose. For females of childbearing age, rubella immunity should be determined. If there is no evidence of immunity, females who are not pregnant should be vaccinated. If there is no evidence of immunity, females who are pregnant should delay immunization until after pregnancy.  Pneumococcal 13-valent conjugate (PCV13) vaccine.** / Consult your health care provider.  Pneumococcal polysaccharide (PPSV23) vaccine.** / 1 to 2 doses if you smoke cigarettes or if you have certain conditions.  Meningococcal vaccine.** / Consult your health care provider.  Hepatitis A vaccine.** / Consult your health care provider.  Hepatitis B vaccine.** / Consult your health care provider.  Haemophilus influenzae type b (Hib) vaccine.** / Consult your health care provider. Ages 65   years and over  Blood pressure check.** / Every 1 to 2 years.  Lipid and cholesterol check.** / Every 5 years beginning at age 22 years.  Lung cancer screening. / Every year if you are aged 73-80 years and have a 30-pack-year history of smoking and currently smoke or have quit within the past 15 years. Yearly screening is stopped once you have quit smoking for at least 15 years or develop a health problem that would prevent you from having lung cancer  treatment.  Clinical breast exam.** / Every year after age 4 years.  BRCA-related cancer risk assessment.** / For women who have family members with a BRCA-related cancer (breast, ovarian, tubal, or peritoneal cancers).  Mammogram.** / Every year beginning at age 40 years and continuing for as long as you are in good health. Consult with your health care provider.  Pap test.** / Every 3 years starting at age 9 years through age 34 or 91 years with 3 consecutive normal Pap tests. Testing can be stopped between 65 and 70 years with 3 consecutive normal Pap tests and no abnormal Pap or HPV tests in the past 10 years.  HPV screening.** / Every 3 years from ages 57 years through ages 64 or 45 years with a history of 3 consecutive normal Pap tests. Testing can be stopped between 65 and 70 years with 3 consecutive normal Pap tests and no abnormal Pap or HPV tests in the past 10 years.  Fecal occult blood test (FOBT) of stool. / Every year beginning at age 15 years and continuing until age 17 years. You may not need to do this test if you get a colonoscopy every 10 years.  Flexible sigmoidoscopy or colonoscopy.** / Every 5 years for a flexible sigmoidoscopy or every 10 years for a colonoscopy beginning at age 86 years and continuing until age 71 years.  Hepatitis C blood test.** / For all people born from [birth year] through 1965 and any individual with known risks for hepatitis C.  Osteoporosis screening.** / A one-time screening for women ages 83 years and over and women at risk for fractures or osteoporosis.  Skin self-exam. / Monthly.  Influenza vaccine. / Every year.  Tetanus, diphtheria, and acellular pertussis (Tdap/Td) vaccine.** / 1 dose of Td every 10 years.  Varicella vaccine.** / Consult your health care provider.  Zoster vaccine.** / 1 dose for adults aged 61 years or older.  Pneumococcal 13-valent conjugate (PCV13) vaccine.** / Consult your health care provider.  Pneumococcal  polysaccharide (PPSV23) vaccine.** / 1 dose for all adults aged 28 years and older.  Meningococcal vaccine.** / Consult your health care provider.  Hepatitis A vaccine.** / Consult your health care provider.  Hepatitis B vaccine.** / Consult your health care provider.  Haemophilus influenzae type b (Hib) vaccine.** / Consult your health care provider. ** Family history and personal history of risk and conditions may change your health care provider's recommendations. Document Released: 07/07/2001 Document Revised: 09/25/2013 Document Reviewed: 10/06/2010 [facility] Patient Information 2015 [place], [place]. This information is not intended to replace advice given to you by your health care provider. Make sure you discuss any questions you have with your health care provider.

## 2014-11-01 NOTE — Progress Notes (Signed)
Subjective:  Patient ID: Adriana Young, female    DOB: February 09, 1961  Age: 54 y.o. MRN: [id number]  CC: Hypertension; Hypothyroidism; and Annual Exam   HPI Adriana Young presents for a CPX and thyroid check, she feels well and offers no complaints.  Outpatient Prescriptions Prior to Visit  Medication Sig Dispense Refill  . amLODipine (NORVASC) 5 MG tablet TAKE 1 TABLET (5 MG TOTAL) BY MOUTH DAILY. 90 tablet 2  . atorvastatin (LIPITOR) 80 MG tablet TAKE 1 TABLET (80 MG TOTAL) BY MOUTH DAILY. 90 tablet 2  . indomethacin (INDOCIN) 50 MG capsule TAKE 1 CAPSULE BY MOUTH 3 TIMES DAILY WITH MEALS. 90 capsule 3  . levonorgestrel-ethinyl estradiol (ORSYTHIA) 0.1-20 MG-MCG tablet Take 1 tablet by mouth daily.    Marland Kitchen losartan (COZAAR) 100 MG tablet TAKE 1 TABLET (100 MG TOTAL) BY MOUTH DAILY. 90 tablet 2  . Multiple Vitamin (MULTIVITAMIN) tablet Take 1 tablet by mouth daily.      Marland Kitchen levothyroxine (SYNTHROID, LEVOTHROID) 125 MCG tablet TAKE ONE TABLET BY MOUTH ONCE DAILY 90 tablet 2  . ondansetron (ZOFRAN-ODT) 8 MG disintegrating tablet Take 1 tablet (8 mg total) by mouth every 8 (eight) hours as needed for nausea. (Patient not taking: Reported on 11/01/2014) 30 tablet 0   Facility-Administered Medications Prior to Visit  Medication Dose Route Frequency Provider Last Rate Last Dose  . gi cocktail (Maalox,Lidocaine,Donnatal)  30 mL Oral Once Adriana Southern, PA        ROS Review of Systems  Constitutional: Negative.  Negative for fever, chills, diaphoresis, appetite change and fatigue.  HENT: Negative.  Negative for trouble swallowing.   Eyes: Negative.   Respiratory: Negative.  Negative for cough, choking, chest tightness, shortness of breath and stridor.   Cardiovascular: Negative.  Negative for chest pain, palpitations and leg swelling.  Gastrointestinal: Negative.  Negative for nausea, vomiting, abdominal pain, diarrhea, constipation and blood in stool.  Endocrine: Negative.   Genitourinary: Negative.     Musculoskeletal: Negative.  Negative for myalgias, back pain, joint swelling and arthralgias.  Skin: Negative.   Allergic/Immunologic: Negative.   Neurological: Negative.  Negative for dizziness, syncope, speech difficulty, light-headedness, numbness and headaches.  Hematological: Negative.  Negative for adenopathy.  Psychiatric/Behavioral: Negative.     Objective:  BP 118/86 mmHg  Pulse 60  Temp(Src) 98.6 F (37 C) (Oral)  Resp 16  Ht 5\' 8"  (1.727 m)  Wt 212 lb (96.163 kg)  BMI 32.24 kg/m2  SpO2 98%  LMP 10/25/2014  BP Readings from Last 3 Encounters:  11/01/14 118/86  04/10/14 124/80  10/04/13 138/88    Wt Readings from Last 3 Encounters:  11/01/14 212 lb (96.163 kg)  04/10/14 205 lb (92.987 kg)  10/04/13 212 lb 4 oz (96.276 kg)    Physical Exam  Constitutional: She is oriented to person, place, and time. She appears well-developed and well-nourished. No distress.  HENT:  Head: Normocephalic and atraumatic.  Mouth/Throat: Oropharynx is clear and moist. No oropharyngeal exudate.  Eyes: Conjunctivae are normal. Right eye exhibits no discharge. Left eye exhibits no discharge. No scleral icterus.  Neck: Normal range of motion. Neck supple. No JVD present. No tracheal deviation present. No thyromegaly present.  Cardiovascular: Normal rate, regular rhythm, normal heart sounds and intact distal pulses.  Exam reveals no gallop and no friction rub.   No murmur heard. Pulmonary/Chest: Effort normal and breath sounds normal. No stridor. No respiratory distress. She has no wheezes. She has no rales. She exhibits no tenderness.  Abdominal: Soft.  Bowel sounds are normal. She exhibits no distension and no mass. There is no tenderness. There is no rebound and no guarding.  Musculoskeletal: Normal range of motion. She exhibits no edema or tenderness.  Lymphadenopathy:    She has no cervical adenopathy.  Neurological: She is oriented to person, place, and time.  Skin: Skin is warm  and dry. No rash noted. She is not diaphoretic. No erythema. No pallor.  Psychiatric: She has a normal mood and affect. Her behavior is normal. Judgment and thought content normal.  Vitals reviewed.   Lab Results  Component Value Date   WBC 6.4 11/01/2014   HGB 15.8* 11/01/2014   HCT 45.8 11/01/2014   PLT 210.0 11/01/2014   GLUCOSE 94 11/01/2014   CHOL 141 11/01/2014   TRIG 51.0 11/01/2014   HDL 48.20 11/01/2014   LDLDIRECT 189.4 07/18/2009   LDLCALC 83 11/01/2014   ALT 16 11/01/2014   AST 18 11/01/2014   NA 137 11/01/2014   K 4.3 11/01/2014   CL 105 11/01/2014   CREATININE 1.03 11/01/2014   BUN 14 11/01/2014   CO2 28 11/01/2014   TSH 3.30 11/01/2014   HGBA1C 5.7 08/11/2012   MICROALBUR 0.4 08/11/2012    US Abdomen Complete  07/30/2010   *RADIOLOGY REPORT*  Clinical Data:  Right upper quadrant pain.  COMPLETE ABDOMINAL ULTRASOUND  Comparison:  None.  Findings:  Gallbladder:  Negative.  Common bile duct:  Measures 5 mm, within normal limits.  Liver:  Parenchymal echogenicity may be slightly increased diffusely.  IVC:  Visualized.  Pancreas:  Visualization is suboptimal due to bowel gas.  Spleen:  Measures 10.0 cm, negative.  Right Kidney:  Measures 11.0 cm, negative.  Left Kidney:  Measures 11.6 cm, negative.  Abdominal aorta:  There may be mild atherosclerosis.  No aneurysm.  IMPRESSION:  1.  No acute findings. 2.  Question mild hepatic steatosis.  Original Report Authenticated By: Adriana Young, M.D.   Assessment & Plan:   Adriana Young was seen today for hypertension, hypothyroidism and annual exam.  Diagnoses and all orders for this visit:  Routine general medical examination at a health care facility - she see a GYN each year, exam done, labs ordered, vaccines were reviewed, PAP and mammo are UTD, pt ed material was given Orders: -     Lipid panel; Future -     Comprehensive metabolic panel; Future -     CBC with Differential/Platelet; Future -     TSH; Future  Other  specified hypothyroidism - her TSh is in the normal range, will remain on the current dose Orders: -     levothyroxine (SYNTHROID, LEVOTHROID) 125 MCG tablet; Take 1 tablet (125 mcg total) by mouth daily.  Hyperlipidemia with target LDL less than 100 - she has achieved her LDL goal  Essential hypertension - her BP is well controlled, lytes and renal function are stable   I have discontinued Adriana Young's ondansetron. I have also changed her levothyroxine. Additionally, I am having her maintain her multivitamin, levonorgestrel-ethinyl estradiol, amLODipine, atorvastatin, indomethacin, and losartan. We will continue to administer gi cocktail.  Meds ordered this encounter  Medications  . levothyroxine (SYNTHROID, LEVOTHROID) 125 MCG tablet    Sig: Take 1 tablet (125 mcg total) by mouth daily.    Dispense:  90 tablet    Refill:  2     Follow-up: Return in about 6 months (around 05/03/2015).  Scarlette Calico, MD

## 2014-11-25 ENCOUNTER — Other Ambulatory Visit: Payer: Self-pay | Admitting: Internal Medicine

## 2015-03-05 LAB — HM MAMMOGRAPHY: HM MAMMO: NORMAL (ref 0–4)

## 2015-03-05 LAB — HM PAP SMEAR

## 2015-04-01 ENCOUNTER — Other Ambulatory Visit: Payer: Self-pay

## 2015-04-01 MED ORDER — LOSARTAN POTASSIUM 100 MG PO TABS: ORAL_TABLET | ORAL | Status: DC

## 2015-04-16 ENCOUNTER — Telehealth: Payer: Self-pay | Admitting: Internal Medicine

## 2015-04-16 DIAGNOSIS — E038 Other specified hypothyroidism: Secondary | ICD-10-CM

## 2015-04-16 NOTE — Telephone Encounter (Signed)
Patient requesting refill for levothyroxine (SYNTHROID, LEVOTHROID) 125 MCG tablet [id number]  She is needing the 90 day  [facility] [address]

## 2015-04-17 MED ORDER — LEVOTHYROXINE SODIUM 125 MCG PO TABS: 125.0000 ug | ORAL_TABLET | Freq: Every day | ORAL | Status: DC

## 2015-04-20 ENCOUNTER — Other Ambulatory Visit: Payer: Self-pay

## 2015-04-20 MED ORDER — INDOMETHACIN 50 MG PO CAPS: ORAL_CAPSULE | ORAL | Status: DC

## 2015-05-06 ENCOUNTER — Other Ambulatory Visit: Payer: Self-pay | Admitting: Internal Medicine

## 2015-06-26 ENCOUNTER — Ambulatory Visit (INDEPENDENT_AMBULATORY_CARE_PROVIDER_SITE_OTHER): Payer: Managed Care, Other (non HMO) | Admitting: Family Medicine

## 2015-06-26 VITALS — BP 136/90 | HR 69 | Temp 98.5°F | Resp 17 | Ht 69.29 in | Wt 214.0 lb

## 2015-06-26 DIAGNOSIS — A499 Bacterial infection, unspecified: Secondary | ICD-10-CM | POA: Diagnosis not present

## 2015-06-26 DIAGNOSIS — R300 Dysuria: Secondary | ICD-10-CM

## 2015-06-26 DIAGNOSIS — N390 Urinary tract infection, site not specified: Secondary | ICD-10-CM

## 2015-06-26 LAB — POCT URINALYSIS DIP (MANUAL ENTRY)
Bilirubin, UA: NEGATIVE
Glucose, UA: NEGATIVE
Nitrite, UA: NEGATIVE
Protein Ur, POC: 100 — AB
Spec Grav, UA: >=1.03
Urobilinogen, UA: 0.2
pH, UA: 6

## 2015-06-26 LAB — POC MICROSCOPIC URINALYSIS (UMFC): Mucus: ABSENT

## 2015-06-26 MED ORDER — PHENAZOPYRIDINE HCL 100 MG PO TABS: 100.0000 mg | ORAL_TABLET | Freq: Three times a day (TID) | ORAL | Status: DC | PRN

## 2015-06-26 MED ORDER — CEPHALEXIN 500 MG PO CAPS: 500.0000 mg | ORAL_CAPSULE | Freq: Two times a day (BID) | ORAL | Status: DC

## 2015-06-26 NOTE — Progress Notes (Signed)
Chief Complaint:  Chief Complaint  Patient presents with  . Dysuria    HPI: Adriana Young is a 55 y.o. female who reports to [facility] today complaining of UTI like sxs.  Has left sided back pain, she has changes in urination since MONday, she usually wakes up every 2 times but now every 2 hours. She has dysuria.  Has had no fevers or chills, nuase or vomiting. She still has some back pain, She has tried increasing fluiuds and drinking crfanberry    Past Medical History  Diagnosis Date  . GERD (gastroesophageal reflux disease)   . Hyperlipidemia   . Hypertension   . Osteoarthritis   . Headache(784.0)   . Allergy   . Glaucoma   . Thyroid disease     hypo  . Type II or unspecified type diabetes mellitus without mention of complication, uncontrolled 09/19/2010   Past Surgical History  Procedure Laterality Date  . Breast surgery      Lumpectomy- left breast, no lymph node involvement   Social History   Social History  . Marital Status: Married    Spouse Name: N/A  . Number of Children: N/A  . Years of Education: N/A   Social History Main Topics  . Smoking status: Former Smoker    Quit date: 05/25/2000  . Smokeless tobacco: Never Used     Comment: Regukar exercise-Yes  . Alcohol Use: No  . Drug Use: No  . Sexual Activity: Yes    Birth Control/ Protection: Pill   Other Topics Concern  . None   Social History Narrative   Family History  Problem Relation Age of Onset  . Alcohol abuse Other   . Arthritis Other   . Diabetes Other   . Hyperlipidemia Other   . Hypertension Other   . Stroke Other     Female 1st degree relative < 60  . Colon cancer Neg Hx   . Esophageal cancer Neg Hx   . Stomach cancer Neg Hx   . Rectal cancer Neg Hx   . Heart disease Mother   . Diabetes Mother    No Known Allergies Prior to Admission medications   Medication Sig Start Date End Date Taking? Authorizing Provider  amLODipine (NORVASC) 5 MG tablet TAKE 1 TABLET (5 MG TOTAL) BY  MOUTH DAILY. 11/27/14  Yes Hoyt Koch, MD  atorvastatin (LIPITOR) 80 MG tablet TAKE 1 TABLET (80 MG TOTAL) BY MOUTH DAILY. 11/27/14  Yes Hoyt Koch, MD  indomethacin (INDOCIN) 50 MG capsule TAKE 1 CAPSULE BY MOUTH 3 TIMES DAILY WITH MEALS. 05/06/15  Yes Janith Lima, MD  levonorgestrel-ethinyl estradiol (ORSYTHIA) 0.1-20 MG-MCG tablet Take 1 tablet by mouth daily.   Yes Historical Provider, MD  levothyroxine (SYNTHROID, LEVOTHROID) 125 MCG tablet Take 1 tablet (125 mcg total) by mouth daily. 04/17/15  Yes Janith Lima, MD  losartan (COZAAR) 100 MG tablet TAKE 1 TABLET (100 MG TOTAL) BY MOUTH DAILY. 04/01/15  Yes Janith Lima, MD  Multiple Vitamin (MULTIVITAMIN) tablet Take 1 tablet by mouth daily.     Yes Historical Provider, MD     ROS: The patient denies fevers, chills, night sweats, unintentional weight loss, chest pain, palpitations, wheezing, dyspnea on exertion, nausea, vomiting, abdominal pain,  melena, numbness, weakness, or tingling.   All other systems have been reviewed and were otherwise negative with the exception of those mentioned in the HPI and as above.    PHYSICAL EXAM: Filed Vitals:  06/26/15 1448  BP: 136/90  Pulse: 69  Temp: 98.5 F (36.9 C)  Resp: 17   Body mass index is 31.34 kg/(m^2).   General: Alert, no acute distress HEENT:  Normocephalic, atraumatic, oropharynx patent. EOMI, PERRLA Cardiovascular:  Regular rate and rhythm, no rubs murmurs or gallops.   Respiratory: Clear to auscultation bilaterally.  No wheezes, rales, or rhonchi.  No cyanosis, no use of accessory musculature Abdominal: No organomegaly, abdomen is soft and non-tender, positive bowel sounds. No masses. Skin: No rashes. Neurologic: Facial musculature symmetric. Psychiatric: Patient acts appropriately throughout our interaction. Musculoskeletal: Gait intact.  + CVA tenderness   LABS: Results for orders placed or performed in visit on 06/26/15  POCT urinalysis  dipstick  Result Value Ref Range   Color, UA yellow yellow   Clarity, UA cloudy (A) clear   Glucose, UA negative negative   Bilirubin, UA negative negative   Ketones, POC UA moderate (40) (A) negative   Spec Grav, UA >=1.030    Blood, UA moderate (A) negative   pH, UA 6.0    Protein Ur, POC =100 (A) negative   Urobilinogen, UA 0.2    Nitrite, UA Negative Negative   Leukocytes, UA moderate (2+) (A) Negative  POCT Microscopic Urinalysis ([facility])  Result Value Ref Range   WBC,UR,HPF,POC Too numerous to count  (A) None WBC/hpf   RBC,UR,HPF,POC Few (A) None RBC/hpf   Bacteria Moderate (A) None, Too numerous to count   Mucus Absent Absent   Epithelial Cells, UR Per Microscopy Few (A) None, Too numerous to count cells/hpf     EKG/XRAY:   Primary read interpreted by Dr. Marin Comment at [facility].   ASSESSMENT/PLAN: Encounter Diagnoses  Name Primary?  . Dysuria   . UTI (urinary tract infection), bacterial Yes   Urine cx pending Rx Keflex Fu prn   Gross sideeffects, risk and benefits, and alternatives of medications d/w patient. Patient is aware that all medications have potential sideeffects and we are unable to predict every sideeffect or drug-drug interaction that may occur.  Thao Le DO  06/26/2015 4:18 PM

## 2015-06-29 LAB — URINE CULTURE: Colony Count: >=100000

## 2015-10-14 ENCOUNTER — Encounter: Payer: Self-pay | Admitting: Internal Medicine

## 2015-10-14 ENCOUNTER — Ambulatory Visit (INDEPENDENT_AMBULATORY_CARE_PROVIDER_SITE_OTHER)
Admission: RE | Admit: 2015-10-14 | Discharge: 2015-10-14 | Disposition: A | Discharge status: Home / Self Care | Payer: Managed Care, Other (non HMO) | Source: Ambulatory Visit | Attending: Internal Medicine | Admitting: Internal Medicine

## 2015-10-14 ENCOUNTER — Other Ambulatory Visit: Payer: Self-pay | Admitting: Internal Medicine

## 2015-10-14 ENCOUNTER — Other Ambulatory Visit (INDEPENDENT_AMBULATORY_CARE_PROVIDER_SITE_OTHER): Payer: Managed Care, Other (non HMO)

## 2015-10-14 ENCOUNTER — Ambulatory Visit (INDEPENDENT_AMBULATORY_CARE_PROVIDER_SITE_OTHER): Payer: Managed Care, Other (non HMO) | Admitting: Internal Medicine

## 2015-10-14 VITALS — BP 110/70 | HR 60 | Temp 98.2°F | Resp 16 | Ht 69.29 in | Wt 207.0 lb

## 2015-10-14 DIAGNOSIS — M25552 Pain in left hip: Secondary | ICD-10-CM | POA: Diagnosis not present

## 2015-10-14 DIAGNOSIS — M545 Low back pain, unspecified: Secondary | ICD-10-CM | POA: Insufficient documentation

## 2015-10-14 DIAGNOSIS — M169 Osteoarthritis of hip, unspecified: Secondary | ICD-10-CM | POA: Insufficient documentation

## 2015-10-14 DIAGNOSIS — G8929 Other chronic pain: Secondary | ICD-10-CM | POA: Diagnosis not present

## 2015-10-14 DIAGNOSIS — K219 Gastro-esophageal reflux disease without esophagitis: Secondary | ICD-10-CM

## 2015-10-14 DIAGNOSIS — Z0000 Encounter for general adult medical examination without abnormal findings: Secondary | ICD-10-CM

## 2015-10-14 DIAGNOSIS — I10 Essential (primary) hypertension: Secondary | ICD-10-CM

## 2015-10-14 DIAGNOSIS — E038 Other specified hypothyroidism: Secondary | ICD-10-CM

## 2015-10-14 LAB — LIPID PANEL
CHOL/HDL RATIO: 4
Cholesterol: 168 mg/dL (ref 0–200)
HDL: 38.3 mg/dL — AB (ref >39.00)
LDL Cholesterol: 109 mg/dL — ABNORMAL HIGH (ref 0–99)
NONHDL: 129.4
TRIGLYCERIDES: 103 mg/dL (ref 0.0–149.0)
VLDL: 20.6 mg/dL (ref 0.0–40.0)

## 2015-10-14 LAB — URINALYSIS, ROUTINE W REFLEX MICROSCOPIC
Bilirubin Urine: NEGATIVE
KETONES UR: NEGATIVE
NITRITE: NEGATIVE
SPECIFIC GRAVITY, URINE: 1.02 (ref 1.000–1.030)
Total Protein, Urine: NEGATIVE
Urine Glucose: NEGATIVE
Urobilinogen, UA: 0.2 (ref 0.0–1.0)
pH: 6 (ref 5.0–8.0)

## 2015-10-14 LAB — COMPREHENSIVE METABOLIC PANEL
ALT: 19 U/L (ref 0–35)
AST: 19 U/L (ref 0–37)
Albumin: 4.5 g/dL (ref 3.5–5.2)
Alkaline Phosphatase: 55 U/L (ref 39–117)
BUN: 15 mg/dL (ref 6–23)
CHLORIDE: 105 meq/L (ref 96–112)
CO2: 24 mEq/L (ref 19–32)
Calcium: 9.5 mg/dL (ref 8.4–10.5)
Creatinine, Ser: 0.95 mg/dL (ref 0.40–1.20)
GFR: 64.84 mL/min (ref >60.00)
GLUCOSE: 88 mg/dL (ref 70–99)
POTASSIUM: 3.5 meq/L (ref 3.5–5.1)
SODIUM: 137 meq/L (ref 135–145)
Total Bilirubin: 0.8 mg/dL (ref 0.2–1.2)
Total Protein: 7 g/dL (ref 6.0–8.3)

## 2015-10-14 LAB — TSH: TSH: 2.3 u[IU]/mL (ref 0.35–4.50)

## 2015-10-14 NOTE — Progress Notes (Signed)
Subjective:  Patient ID: Adriana Young, female    DOB: 1960/11/02  Age: 55 y.o. MRN: [id number]  CC: Hypertension; Hypothyroidism; Annual Exam; Osteoarthritis; and Back Pain   HPI Adriana Young presents for a CPX.  She complains of worsening left hip and back pain for about 6 months. She denies any acute trauma or injury and just says the symptoms have progressively gotten worse. Sometimes the pain radiates into her left lower extremity. She denies paresthesias in her lower extremities. She takes indomethacin for symptom relief.  She tells me her blood pressures been well controlled on the combination of losartan and amlodipine. She also feels like her Synthroid dose is adequate as she has had no episodes of fatigue, constipation, or edema.  Outpatient Prescriptions Prior to Visit  Medication Sig Dispense Refill  . amLODipine (NORVASC) 5 MG tablet TAKE 1 TABLET (5 MG TOTAL) BY MOUTH DAILY. 90 tablet 2  . atorvastatin (LIPITOR) 80 MG tablet TAKE 1 TABLET (80 MG TOTAL) BY MOUTH DAILY. 90 tablet 2  . indomethacin (INDOCIN) 50 MG capsule TAKE 1 CAPSULE BY MOUTH 3 TIMES DAILY WITH MEALS. 60 capsule 3  . levonorgestrel-ethinyl estradiol (ORSYTHIA) 0.1-20 MG-MCG tablet Take 1 tablet by mouth daily.    Marland Kitchen levothyroxine (SYNTHROID, LEVOTHROID) 125 MCG tablet Take 1 tablet (125 mcg total) by mouth daily. 90 tablet 1  . losartan (COZAAR) 100 MG tablet TAKE 1 TABLET (100 MG TOTAL) BY MOUTH DAILY. 90 tablet 2  . Multiple Vitamin (MULTIVITAMIN) tablet Take 1 tablet by mouth daily.      . cephALEXin (KEFLEX) 500 MG capsule Take 1 capsule (500 mg total) by mouth 2 (two) times daily. 20 capsule 0  . phenazopyridine (PYRIDIUM) 100 MG tablet Take 1 tablet (100 mg total) by mouth 3 (three) times daily as needed for pain. 10 tablet 0   Facility-Administered Medications Prior to Visit  Medication Dose Route Frequency Provider Last Rate Last Dose  . gi cocktail (Maalox,Lidocaine,Donnatal)  30 mL Oral Once [employer], PA        ROS Review of Systems  Constitutional: Negative.  Negative for fever, chills, diaphoresis, appetite change and fatigue.  HENT: Negative.   Eyes: Negative.  Negative for visual disturbance.  Respiratory: Negative.  Negative for cough, choking, chest tightness, shortness of breath and stridor.   Cardiovascular: Negative.  Negative for chest pain, palpitations and leg swelling.  Gastrointestinal: Negative.  Negative for nausea, vomiting, abdominal pain, diarrhea, constipation and blood in stool.  Endocrine: Negative.   Genitourinary: Negative.  Negative for difficulty urinating.  Musculoskeletal: Positive for back pain and arthralgias. Negative for myalgias, joint swelling, gait problem and neck pain.  Skin: Negative.   Allergic/Immunologic: Negative.   Neurological: Negative.  Negative for dizziness, tremors, syncope, weakness, light-headedness, numbness and headaches.  Hematological: Negative.  Negative for adenopathy. Does not bruise/bleed easily.  Psychiatric/Behavioral: Negative.     Objective:  BP 110/70 mmHg  Pulse 60  Temp(Src) 98.2 F (36.8 C) (Oral)  Resp 16  Ht 5' 9.29" (1.76 m)  Wt 207 lb (93.895 kg)  BMI 30.31 kg/m2  SpO2 97%  LMP 10/25/2014  BP Readings from Last 3 Encounters:  10/14/15 110/70  06/26/15 136/90  11/01/14 118/86    Wt Readings from Last 3 Encounters:  10/14/15 207 lb (93.895 kg)  06/26/15 214 lb (97.07 kg)  11/01/14 212 lb (96.163 kg)    Physical Exam  Constitutional: She is oriented to person, place, and time. No distress.  HENT:  Head:  Normocephalic and atraumatic.  Mouth/Throat: Oropharynx is clear and moist. No oropharyngeal exudate.  Eyes: Conjunctivae are normal. Right eye exhibits no discharge. Left eye exhibits no discharge. No scleral icterus.  Neck: Normal range of motion. Neck supple. No JVD present. No tracheal deviation present. No thyromegaly present.  Cardiovascular: Normal rate, regular rhythm, normal  heart sounds and intact distal pulses.  Exam reveals no gallop and no friction rub.   No murmur heard. Pulmonary/Chest: Effort normal and breath sounds normal. No stridor. No respiratory distress. She has no wheezes. She has no rales. She exhibits no tenderness.  Abdominal: Soft. Bowel sounds are normal. She exhibits no distension and no mass. There is no tenderness. There is no rebound and no guarding.  Musculoskeletal: Normal range of motion. She exhibits no edema or tenderness.       Left hip: Normal. She exhibits normal range of motion, normal strength, no tenderness, no bony tenderness, no swelling, no crepitus, no deformity and no laceration.       Lumbar back: Normal. She exhibits normal range of motion, no tenderness, no bony tenderness, no swelling, no deformity, no laceration and no pain.  Lymphadenopathy:    She has no cervical adenopathy.  Neurological: She is alert and oriented to person, place, and time. She has normal strength. She displays no atrophy, no tremor and normal reflexes. No cranial nerve deficit or sensory deficit. She exhibits normal muscle tone. She displays a negative Romberg sign. She displays no seizure activity. Coordination and gait normal.  Neg SLR in BLE  Skin: Skin is warm and dry. No rash noted. She is not diaphoretic. No erythema. No pallor.  Vitals reviewed.   Lab Results  Component Value Date   WBC 6.4 11/01/2014   HGB 15.8* 11/01/2014   HCT 45.8 11/01/2014   PLT 210.0 11/01/2014   GLUCOSE 88 10/14/2015   CHOL 168 10/14/2015   TRIG 103.0 10/14/2015   HDL 38.30* 10/14/2015   LDLDIRECT 189.4 07/18/2009   LDLCALC 109* 10/14/2015   ALT 19 10/14/2015   AST 19 10/14/2015   NA 137 10/14/2015   K 3.5 10/14/2015   CL 105 10/14/2015   CREATININE 0.95 10/14/2015   BUN 15 10/14/2015   CO2 24 10/14/2015   TSH 2.30 10/14/2015   HGBA1C 5.7 08/11/2012   MICROALBUR 0.4 08/11/2012    US Abdomen Complete  07/30/2010  *RADIOLOGY REPORT* Clinical Data:   Right upper quadrant pain. COMPLETE ABDOMINAL ULTRASOUND Comparison:  None. Findings: Gallbladder:  Negative. Common bile duct:  Measures 5 mm, within normal limits. Liver:  Parenchymal echogenicity may be slightly increased diffusely. IVC:  Visualized. Pancreas:  Visualization is suboptimal due to bowel gas. Spleen:  Measures 10.0 cm, negative. Right Kidney:  Measures 11.0 cm, negative. Left Kidney:  Measures 11.6 cm, negative. Abdominal aorta:  There may be mild atherosclerosis.  No aneurysm. IMPRESSION: 1.  No acute findings. 2.  Question mild hepatic steatosis. Original Report Authenticated By: Luretha Rued, M.D.   Assessment & Plan:   Adriana Young was seen today for hypertension, hypothyroidism, annual exam, osteoarthritis and back pain.  Diagnoses and all orders for this visit:  Essential hypertension- Her blood pressure is well-controlled, like to lites and renal function are stable. -     Comprehensive metabolic panel; Future  Other specified hypothyroidism- her TSH is in the normal range, she will remain on the current dose of levothyroxine. -     TSH; Future  Gastroesophageal reflux disease without esophagitis- symptoms are well controlled.  Routine general medical examination at a health care facility- exam completed, labs ordered and reviewed, Pap/mammogram/colonoscopy are up-to-date, patient education material was given. -     Lipid panel; Future -     Urinalysis, Routine w reflex microscopic (not at [facility]); Future -     Hepatitis C antibody; Future -     HIV antibody; Future  Low back pain at multiple sites- plain film shows degenerative disc disease, will continue indomethacin as needed -     DG Lumbar Spine Complete; Future  Chronic left hip pain- plain film shows mild degenerative joint disease, will continue indomethacin as needed. -     Cancel: DG HIP UNILAT WITH PELVIS MIN 4 VIEWS LEFT; Future   I have discontinued Ms. Feeley cephALEXin and phenazopyridine. I am also  having her maintain her multivitamin, levonorgestrel-ethinyl estradiol, atorvastatin, amLODipine, losartan, levothyroxine, and indomethacin. We will continue to administer gi cocktail.  No orders of the defined types were placed in this encounter.     Follow-up: Return in about 6 weeks (around 11/25/2015).  Scarlette Calico, MD

## 2015-10-14 NOTE — Progress Notes (Signed)
Pre visit review using our clinic review tool, if applicable. No additional management support is needed unless otherwise documented below in the visit note. 

## 2015-10-14 NOTE — Patient Instructions (Signed)

## 2015-10-15 ENCOUNTER — Encounter: Payer: Self-pay | Admitting: Internal Medicine

## 2015-10-15 LAB — HIV ANTIBODY (ROUTINE TESTING W REFLEX): HIV: NONREACTIVE

## 2015-10-15 LAB — HEPATITIS C ANTIBODY: HCV AB: NEGATIVE

## 2015-10-22 ENCOUNTER — Other Ambulatory Visit: Payer: Self-pay | Admitting: Internal Medicine

## 2015-10-28 ENCOUNTER — Other Ambulatory Visit: Payer: Self-pay | Admitting: Internal Medicine

## 2015-10-28 ENCOUNTER — Telehealth: Payer: Self-pay | Admitting: Internal Medicine

## 2015-10-28 DIAGNOSIS — R300 Dysuria: Secondary | ICD-10-CM

## 2015-10-28 DIAGNOSIS — G8929 Other chronic pain: Secondary | ICD-10-CM

## 2015-10-28 DIAGNOSIS — Z0000 Encounter for general adult medical examination without abnormal findings: Secondary | ICD-10-CM

## 2015-10-28 DIAGNOSIS — M25552 Pain in left hip: Principal | ICD-10-CM

## 2015-10-28 DIAGNOSIS — M545 Low back pain, unspecified: Secondary | ICD-10-CM

## 2015-10-28 MED ORDER — TRAMADOL HCL 50 MG PO TABS: 50.0000 mg | ORAL_TABLET | Freq: Four times a day (QID) | ORAL | Status: DC | PRN

## 2015-10-28 NOTE — Telephone Encounter (Signed)
Rx written for pain

## 2015-10-28 NOTE — Telephone Encounter (Signed)
Spoke w pt. States yes on pain meds for back. Denies surgeon at this time due to summer plans for hip, Will order UA due to recurrent symptoms from last week pt will come in tomorrow. Pt also informed of scan.

## 2015-10-28 NOTE — Telephone Encounter (Signed)
LMOVM there was also 3 mychart msg sent regarding this that the pt has not read as well

## 2015-10-28 NOTE — Telephone Encounter (Signed)
Patient called to check on the lab orders that dr Ronnald Ramp stated he would send on 10/15/2015. There are no lab orders entered that i can see.please follow up with the patient.   She also advised that the medication that she is taking is not helping regarding her arthritis

## 2015-10-29 ENCOUNTER — Other Ambulatory Visit (INDEPENDENT_AMBULATORY_CARE_PROVIDER_SITE_OTHER): Payer: Managed Care, Other (non HMO)

## 2015-10-29 DIAGNOSIS — Z0000 Encounter for general adult medical examination without abnormal findings: Secondary | ICD-10-CM

## 2015-10-29 DIAGNOSIS — R300 Dysuria: Secondary | ICD-10-CM

## 2015-10-29 LAB — URINALYSIS WITH CULTURE, IF INDICATED
Bilirubin Urine: NEGATIVE
KETONES UR: NEGATIVE
NITRITE: NEGATIVE
SPECIFIC GRAVITY, URINE: 1.015 (ref 1.000–1.030)
TOTAL PROTEIN, URINE-UPE24: NEGATIVE
URINE GLUCOSE: NEGATIVE
Urobilinogen, UA: 0.2 (ref 0.0–1.0)
pH: 6.5 (ref 5.0–8.0)

## 2015-10-29 LAB — URINALYSIS, ROUTINE W REFLEX MICROSCOPIC
Bilirubin Urine: NEGATIVE
Ketones, ur: NEGATIVE
NITRITE: NEGATIVE
SPECIFIC GRAVITY, URINE: 1.015 (ref 1.000–1.030)
Total Protein, Urine: NEGATIVE
Urine Glucose: NEGATIVE
Urobilinogen, UA: 0.2 (ref 0.0–1.0)
pH: 6.5 (ref 5.0–8.0)

## 2015-10-29 NOTE — Telephone Encounter (Signed)
Rx faxed to pharmacy...[name]

## 2015-10-31 ENCOUNTER — Other Ambulatory Visit: Payer: Self-pay | Admitting: Internal Medicine

## 2015-10-31 ENCOUNTER — Encounter: Payer: Self-pay | Admitting: Internal Medicine

## 2015-10-31 DIAGNOSIS — B9620 Unspecified Escherichia coli [E. coli] as the cause of diseases classified elsewhere: Secondary | ICD-10-CM

## 2015-10-31 DIAGNOSIS — N390 Urinary tract infection, site not specified: Principal | ICD-10-CM

## 2015-10-31 MED ORDER — SULFAMETHOXAZOLE-TRIMETHOPRIM 800-160 MG PO TABS
1.0000 | ORAL_TABLET | Freq: Two times a day (BID) | ORAL | Status: AC
Start: 2015-10-31 — End: 2015-11-05

## 2015-11-01 LAB — CULTURE, URINE COMPREHENSIVE

## 2015-11-19 ENCOUNTER — Ambulatory Visit (INDEPENDENT_AMBULATORY_CARE_PROVIDER_SITE_OTHER): Payer: Managed Care, Other (non HMO) | Admitting: Family Medicine

## 2015-11-19 ENCOUNTER — Encounter (HOSPITAL_COMMUNITY): Payer: Self-pay

## 2015-11-19 ENCOUNTER — Ambulatory Visit (HOSPITAL_COMMUNITY)
Admission: RE | Admit: 2015-11-19 | Discharge: 2015-11-19 | Disposition: A | Discharge status: Home / Self Care | Payer: Managed Care, Other (non HMO) | Source: Ambulatory Visit | Attending: Family Medicine | Admitting: Family Medicine

## 2015-11-19 VITALS — BP 122/82 | HR 72 | Temp 99.1°F | Resp 16 | Ht 69.0 in | Wt 205.6 lb

## 2015-11-19 DIAGNOSIS — N200 Calculus of kidney: Secondary | ICD-10-CM | POA: Diagnosis not present

## 2015-11-19 DIAGNOSIS — R1031 Right lower quadrant pain: Secondary | ICD-10-CM | POA: Diagnosis not present

## 2015-11-19 DIAGNOSIS — R112 Nausea with vomiting, unspecified: Secondary | ICD-10-CM

## 2015-11-19 DIAGNOSIS — R509 Fever, unspecified: Secondary | ICD-10-CM

## 2015-11-19 DIAGNOSIS — R350 Frequency of micturition: Secondary | ICD-10-CM

## 2015-11-19 DIAGNOSIS — R3129 Other microscopic hematuria: Secondary | ICD-10-CM

## 2015-11-19 LAB — POCT URINALYSIS DIP (MANUAL ENTRY)
BILIRUBIN UA: NEGATIVE
BILIRUBIN UA: NEGATIVE
GLUCOSE UA: NEGATIVE
LEUKOCYTES UA: NEGATIVE
NITRITE UA: NEGATIVE
PH UA: 7
Protein Ur, POC: NEGATIVE
Spec Grav, UA: 1.015
Urobilinogen, UA: 0.2

## 2015-11-19 LAB — POC MICROSCOPIC URINALYSIS (UMFC): MUCUS RE: ABSENT

## 2015-11-19 LAB — POCT CBC
GRANULOCYTE PERCENT: 69.9 % (ref 37–80)
HEMATOCRIT: 47.2 % (ref 37.7–47.9)
Hemoglobin: 16.7 g/dL — AB (ref 12.2–16.2)
LYMPH, POC: 2.7 (ref 0.6–3.4)
MCH, POC: 32 pg — AB (ref 27–31.2)
MCHC: 35.3 g/dL (ref 31.8–35.4)
MCV: 90.6 fL (ref 80–97)
MID (CBC): 0.3 (ref 0–0.9)
MPV: 7.8 fL (ref 0–99.8)
POC GRANULOCYTE: 6.9 (ref 2–6.9)
POC LYMPH %: 27.1 % (ref 10–50)
POC MID %: 3 % (ref 0–12)
Platelet Count, POC: 227 10*3/uL (ref 142–424)
RBC: 5.21 M/uL (ref 4.04–5.48)
RDW, POC: 12.8 %
WBC: 9.9 10*3/uL (ref 4.6–10.2)

## 2015-11-19 LAB — GLUCOSE, POCT (MANUAL RESULT ENTRY): POC Glucose: 93 mg/dl (ref 70–99)

## 2015-11-19 MED ORDER — DIATRIZOATE MEGLUMINE & SODIUM 66-10 % PO SOLN
15.0000 mL | Freq: Once | ORAL | Status: AC
  Administered 2015-11-19: 30 mL via ORAL

## 2015-11-19 MED ORDER — IOPAMIDOL (ISOVUE-300) INJECTION 61%
100.0000 mL | Freq: Once | INTRAVENOUS | Status: AC | PRN
  Administered 2015-11-19: 100 mL via INTRAVENOUS

## 2015-11-19 MED ORDER — ONDANSETRON 4 MG PO TBDP: 4.0000 mg | ORAL_TABLET | Freq: Three times a day (TID) | ORAL | Status: DC | PRN

## 2015-11-19 MED ORDER — ONDANSETRON 4 MG PO TBDP
4.0000 mg | ORAL_TABLET | Freq: Once | ORAL | Status: AC
  Administered 2015-11-19: 4 mg via ORAL

## 2015-11-19 NOTE — Patient Instructions (Addendum)
     IF you received an x-ray today, you will receive an invoice from [facility]. Please contact [facility] at [phone] with questions or concerns regarding your invoice.   IF you received labwork today, you will receive an invoice from [employer]. Please contact [facility] at [phone] with questions or concerns regarding your invoice.   Our billing staff will not be able to assist you with questions regarding bills from these companies.  You will be contacted with the lab results as soon as they are available. The fastest way to get your results is to activate your My Chart account. Instructions are located on the last page of this paperwork. If you have not heard from [place] regarding the results in 2 weeks, please contact this office.     I will check a CT scan tonight to see if you have appendicitis. I will let you know when results are back, but you may want to wait at the hospital until I receive those results in case you do have appendicitis. I prescribed some Zofran in case the study does not show appendicitis and this is due to a virus. Zofran can be taken up to every 8 hours to help with nausea and vomiting. Small sips of fluids frequently, and recheck in the next 24-48 hours if not improving. Sooner if worse.

## 2015-11-19 NOTE — Progress Notes (Addendum)
Subjective:  By signing my name below, I, Moises Blood, attest that this documentation has been prepared under the direction and in the presence of Merri Ray, MD. Electronically Signed: Moises Blood, [place]. 11/19/2015 , 5:01 PM .  Patient was seen in Room 1 .   Patient ID: Adriana Young, female    DOB: Jun 30, 1960, 54 y.o.   MRN: [id number] Chief Complaint  Patient presents with  . Emesis    last night  . Abdominal Pain   HPI Adriana Young is a 55 y.o. female  Patient was on a 4-day "stay-home" vacation from work. Early this morning at 12:30AM midnight, she woke up throwing up with a "sweet taste" rather than a "usual foul taste". She had a total of 3-4 episodes of vomiting, last episode at 1:30PM. She was sweating a lot during her vomiting episodes. She also mentions having 2 small episodes of diarrhea between 1:00AM and 7:00AM. She also complains of associated right side abdominal pain with hot and cold spells and fatigue at 7:30AM. She has only been able to drink a little hot tea at that time. She denies any blood in her vomit or stool. She denies feeling feverish, cough or shortness of breath. She denies any changes in urinary frequency, as this has been ongoing for a long time. She denies history of abdominal surgery. Her last colonoscopy was in April 2013, and it was normal without diverticulitis. She's tried pepto at around 11:30AM~12:00PM today without relief.   Her last meal was at 7:00PM last night. Her husband is fine without symptoms.   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   Patient Active Problem List   Diagnosis Date Noted  . E. coli UTI (urinary tract infection) 10/31/2015  . Dysuria 10/28/2015  . Low back pain at multiple sites 10/14/2015  . Chronic left hip pain 10/14/2015  . Routine general medical examination at a health care facility 11/01/2014  . Fatty liver 09/19/2010  . Hypothyroidism 06/18/2008  . Hyperlipidemia with target LDL less than 100  06/04/2008  . Essential hypertension 06/04/2008  . GERD 06/04/2008  . OSTEOARTHRITIS 06/04/2008   Past Medical History  Diagnosis Date  . GERD (gastroesophageal reflux disease)   . Hyperlipidemia   . Hypertension   . Osteoarthritis   . Headache(784.0)   . Allergy   . Glaucoma   . Thyroid disease     hypo  . Type II or unspecified type diabetes mellitus without mention of complication, uncontrolled 09/19/2010   Past Surgical History  Procedure Laterality Date  . Breast surgery      Lumpectomy- left breast, no lymph node involvement   No Known Allergies Prior to Admission medications   Medication Sig Start Date End Date Taking? Authorizing Provider  amLODipine (NORVASC) 5 MG tablet TAKE 1 TABLET (5 MG TOTAL) BY MOUTH DAILY. 11/27/14  Yes Hoyt Koch, MD  atorvastatin (LIPITOR) 80 MG tablet TAKE 1 TABLET (80 MG TOTAL) BY MOUTH DAILY. 11/27/14  Yes Hoyt Koch, MD  levonorgestrel-ethinyl estradiol (ORSYTHIA) 0.1-20 MG-MCG tablet Take 1 tablet by mouth daily.   Yes Historical Provider, MD  levothyroxine (SYNTHROID, LEVOTHROID) 125 MCG tablet TAKE 1 TABLET (125 MCG TOTAL) BY MOUTH DAILY. 10/22/15  Yes Janith Lima, MD  Lifitegrast Shirley Friar) 5 % SOLN Apply to eye.   Yes Historical Provider, MD  losartan (COZAAR) 100 MG tablet TAKE 1 TABLET (100 MG TOTAL) BY MOUTH DAILY. 04/01/15  Yes Janith Lima, MD  Multiple Vitamin (MULTIVITAMIN) tablet  Take 1 tablet by mouth daily.     Yes Historical Provider, MD  traMADol (ULTRAM) 50 MG tablet Take 1 tablet (50 mg total) by mouth every 6 (six) hours as needed. 10/28/15  Yes Janith Lima, MD  indomethacin (INDOCIN) 50 MG capsule TAKE 1 CAPSULE BY MOUTH 3 TIMES DAILY WITH MEALS. Patient not taking: Reported on 11/19/2015 05/06/15   Janith Lima, MD   Social History   Social History  . Marital Status: Married    Spouse Name: N/A  . Number of Children: N/A  . Years of Education: N/A   Occupational History  . Not on file.    Social History Main Topics  . Smoking status: Former Smoker    Quit date: 05/25/2000  . Smokeless tobacco: Never Used     Comment: Regukar exercise-Yes  . Alcohol Use: No  . Drug Use: No  . Sexual Activity: Yes    Birth Control/ Protection: Pill   Other Topics Concern  . Not on file   Social History Narrative   Review of Systems  Constitutional: Positive for chills, diaphoresis, appetite change and fatigue. Negative for fever.  Respiratory: Negative for cough, shortness of breath and wheezing.   Gastrointestinal: Positive for vomiting, abdominal pain and diarrhea. Negative for nausea, constipation and blood in stool.       Objective:   Physical Exam  Constitutional: She is oriented to person, place, and time. She appears well-developed and well-nourished. No distress.  HENT:  Head: Normocephalic and atraumatic.  Eyes: EOM are normal. Pupils are equal, round, and reactive to light.  Neck: Neck supple.  Cardiovascular: Normal rate.   Pulmonary/Chest: Effort normal. No respiratory distress.  Abdominal: Bowel sounds are normal. There is tenderness in the suprapubic area. There is rebound, guarding (slight) and tenderness at McBurney's point. There is no CVA tenderness.  Tender primarily over mcburney's point with slight guarding, positive rebound, minimal suprapubic tenderness, positive heel jar on the right  Musculoskeletal: Normal range of motion.  Neurological: She is alert and oriented to person, place, and time.  Skin: Skin is warm and dry.  Psychiatric: She has a normal mood and affect. Her behavior is normal.  Nursing note and vitals reviewed.   Filed Vitals:   11/19/15 1547  BP: 122/82  Pulse: 72  Temp: 99.1 F (37.3 C)  TempSrc: Oral  Resp: 16  Height: 5\' 9"  (1.753 m)  Weight: 205 lb 9.6 oz (93.26 kg)  SpO2: 99%   Results for orders placed or performed in visit on 11/19/15  POCT CBC  Result Value Ref Range   WBC 9.9 4.6 - 10.2 K/uL   Lymph, poc 2.7 0.6  - 3.4   POC LYMPH PERCENT 27.1 10 - 50 %L   MID (cbc) 0.3 0 - 0.9   POC MID % 3.0 0 - 12 %M   POC Granulocyte 6.9 2 - 6.9   Granulocyte percent 69.9 37 - 80 %G   RBC 5.21 4.04 - 5.48 M/uL   Hemoglobin 16.7 (A) 12.2 - 16.2 g/dL   HCT, POC 47.2 37.7 - 47.9 %   MCV 90.6 80 - 97 fL   MCH, POC 32.0 (A) 27 - 31.2 pg   MCHC 35.3 31.8 - 35.4 g/dL   RDW, POC 12.8 %   Platelet Count, POC 227 142 - 424 K/uL   MPV 7.8 0 - 99.8 fL  POCT glucose (manual entry)  Result Value Ref Range   POC Glucose 93 70 - 99  mg/dl  POCT urinalysis dipstick  Result Value Ref Range   Color, UA yellow yellow   Clarity, UA clear clear   Glucose, UA negative negative   Bilirubin, UA negative negative   Ketones, POC UA negative negative   Spec Grav, UA 1.015    Blood, UA moderate (A) negative   pH, UA 7.0    Protein Ur, POC negative negative   Urobilinogen, UA 0.2    Nitrite, UA Negative Negative   Leukocytes, UA Negative Negative  POCT Microscopic Urinalysis ([facility])  Result Value Ref Range   WBC,UR,HPF,POC None None WBC/hpf   RBC,UR,HPF,POC Few (A) None RBC/hpf   Bacteria None None, Too numerous to count   Mucus Absent Absent   Epithelial Cells, UR Per Microscopy Few (A) None, Too numerous to count cells/hpf       Assessment & Plan:    Adriana Young is a 55 y.o. female RLQ abdominal pain - Plan: POCT CBC, CT Abdomen Pelvis W Contrast, CANCELED: CT Abdomen Pelvis W Contrast  Suprapubic pain, right - Plan: POCT CBC, POCT glucose (manual entry), POCT urinalysis dipstick, POCT Microscopic Urinalysis ([facility]), CT Abdomen Pelvis W Contrast, CANCELED: CT Abdomen Pelvis W Contrast  Urinary frequency - Plan: POCT urinalysis dipstick, POCT Microscopic Urinalysis ([facility])  Non-intractable vomiting with nausea, vomiting of unspecified type - Plan: POCT CBC, CT Abdomen Pelvis W Contrast, ondansetron (ZOFRAN-ODT) disintegrating tablet 4 mg, ondansetron (ZOFRAN ODT) 4 MG disintegrating tablet  Microscopic hematuria -  Plan: CT Abdomen Pelvis W Contrast, CANCELED: CT Abdomen Pelvis W Contrast  Fever chills - Plan: CT Abdomen Pelvis W Contrast, CANCELED: CT Abdomen Pelvis W Contrast  Acute onset of right lower quadrant abdominal pain with nausea and vomiting since 12:30 AM. Trace microscopic hematuria, but no other acute urinary symptoms. She does have some rebound and guarding on my exam, concerning for possible acute appendicitis. Will check CT abdomen pelvis tonight, Zofran 4 mg given in office, and prescription printed if her CT scan does not show acute infection. Symptomatic care was discussed if this turns out to be a viral illness, and RTC precautions given.   Addendum. CT scan report received last night: Ct Abdomen Pelvis W Contrast  11/19/2015  CLINICAL DATA:  Right lower quadrant pain with hematuria EXAM: CT ABDOMEN AND PELVIS WITH CONTRAST TECHNIQUE: Multidetector CT imaging of the abdomen and pelvis was performed using the standard protocol following bolus administration of intravenous contrast. CONTRAST:  129mL ISOVUE-300 IOPAMIDOL (ISOVUE-300) INJECTION 61% COMPARISON:  None. FINDINGS: Lower chest:  No acute findings. Hepatobiliary: No masses or other significant abnormality. Pancreas: No mass, inflammatory changes, or other significant abnormality. Spleen: Within normal limits in size and appearance. Adrenals/Urinary Tract: No masses identified. No evidence of hydronephrosis. Tiny nonobstructing stone is noted on the left measuring 2 mm. Stomach/Bowel: No evidence of obstruction, inflammatory process, or abnormal fluid collections. The appendix is within normal limits. Vascular/Lymphatic: No pathologically enlarged lymph nodes. No evidence of abdominal aortic aneurysm. Mild aortoiliac calcifications are noted. Reproductive: No mass or other significant abnormality. Other: None. Musculoskeletal:  No suspicious bone lesions identified. IMPRESSION: No acute abnormality noted. Tiny 2 mm nonobstructing left renal  stone. Electronically Signed   By: Inez Catalina M.D.   On: 11/19/2015 21:03   Called patient with results, continue sips of fluids, Zofran if needed for possible viral gastroenteritis. RTC/ER precautions if worsening or if unable to maintain hydration. Understanding expressed. Tiny left renal stone likely noncontributory to current symptoms.   Meds ordered this encounter  Medications  . Lifitegrast (XIIDRA) 5 % SOLN    Sig: Apply to eye.  . ondansetron (ZOFRAN-ODT) disintegrating tablet 4 mg    Sig:   . ondansetron (ZOFRAN ODT) 4 MG disintegrating tablet    Sig: Take 1 tablet (4 mg total) by mouth every 8 (eight) hours as needed for nausea or vomiting.    Dispense:  10 tablet    Refill:  0   Patient Instructions       IF you received an x-ray today, you will receive an invoice from [facility]. Please contact [facility] at [phone] with questions or concerns regarding your invoice.   IF you received labwork today, you will receive an invoice from [employer]. Please contact [facility] at [phone] with questions or concerns regarding your invoice.   Our billing staff will not be able to assist you with questions regarding bills from these companies.  You will be contacted with the lab results as soon as they are available. The fastest way to get your results is to activate your My Chart account. Instructions are located on the last page of this paperwork. If you have not heard from [place] regarding the results in 2 weeks, please contact this office.     I will check a CT scan tonight to see if you have appendicitis. I will let you know when results are back, but you may want to wait at the hospital until I receive those results in case you do have appendicitis. I prescribed some Zofran in case the study does not show appendicitis and this is due to a virus. Zofran can be taken up to every 8 hours to help with nausea and vomiting. Small  sips of fluids frequently, and recheck in the next 24-48 hours if not improving. Sooner if worse.     I personally performed the services described in this documentation, which was scribed in my presence. The recorded information has been reviewed and considered, and addended by me as needed.   Signed,   Merri Ray, MD [facility].  11/19/2015 6:31 PM

## 2015-11-21 ENCOUNTER — Other Ambulatory Visit: Payer: Self-pay | Admitting: Internal Medicine

## 2015-12-27 DIAGNOSIS — H179 Unspecified corneal scar and opacity: Secondary | ICD-10-CM | POA: Insufficient documentation

## 2015-12-27 DIAGNOSIS — H52202 Unspecified astigmatism, left eye: Secondary | ICD-10-CM | POA: Insufficient documentation

## 2016-01-28 ENCOUNTER — Other Ambulatory Visit: Payer: Self-pay | Admitting: Internal Medicine

## 2016-02-07 ENCOUNTER — Other Ambulatory Visit: Payer: Self-pay | Admitting: Internal Medicine

## 2016-03-03 ENCOUNTER — Other Ambulatory Visit: Payer: Self-pay | Admitting: Internal Medicine

## 2016-04-13 ENCOUNTER — Other Ambulatory Visit: Payer: Self-pay | Admitting: Internal Medicine

## 2016-04-16 ENCOUNTER — Other Ambulatory Visit: Payer: Self-pay | Admitting: Internal Medicine

## 2016-04-18 NOTE — Telephone Encounter (Addendum)
6 mo supply on 125 mcg sent to [facility] in May. LOV was also in May. TSH done in May results: 2.30. No appt scheduled to see you.

## 2016-04-20 NOTE — Telephone Encounter (Signed)
Okay for 30-day supply 

## 2016-04-20 NOTE — Telephone Encounter (Signed)
Patient has an appointment on Wed Nov 29.

## 2016-04-20 NOTE — Telephone Encounter (Addendum)
LVM for pt to call back as soon as possible.   RE: patient needs an appt (follow up - thyroid) for further refills.

## 2016-04-22 ENCOUNTER — Ambulatory Visit (INDEPENDENT_AMBULATORY_CARE_PROVIDER_SITE_OTHER): Payer: Managed Care, Other (non HMO) | Admitting: Internal Medicine

## 2016-04-22 ENCOUNTER — Encounter: Payer: Self-pay | Admitting: Internal Medicine

## 2016-04-22 ENCOUNTER — Other Ambulatory Visit (INDEPENDENT_AMBULATORY_CARE_PROVIDER_SITE_OTHER): Payer: Managed Care, Other (non HMO)

## 2016-04-22 VITALS — BP 144/94 | HR 60 | Temp 99.0°F | Resp 16 | Ht 69.0 in | Wt 196.8 lb

## 2016-04-22 DIAGNOSIS — K760 Fatty (change of) liver, not elsewhere classified: Secondary | ICD-10-CM | POA: Diagnosis not present

## 2016-04-22 DIAGNOSIS — I10 Essential (primary) hypertension: Secondary | ICD-10-CM | POA: Diagnosis not present

## 2016-04-22 DIAGNOSIS — E039 Hypothyroidism, unspecified: Secondary | ICD-10-CM | POA: Diagnosis not present

## 2016-04-22 LAB — COMPREHENSIVE METABOLIC PANEL
ALK PHOS: 57 U/L (ref 39–117)
ALT: 45 U/L — AB (ref 0–35)
AST: 29 U/L (ref 0–37)
Albumin: 4.2 g/dL (ref 3.5–5.2)
BILIRUBIN TOTAL: 0.5 mg/dL (ref 0.2–1.2)
BUN: 18 mg/dL (ref 6–23)
CALCIUM: 9.5 mg/dL (ref 8.4–10.5)
CO2: 30 mEq/L (ref 19–32)
Chloride: 105 mEq/L (ref 96–112)
Creatinine, Ser: 1 mg/dL (ref 0.40–1.20)
GFR: 60.99 mL/min (ref >60.00)
GLUCOSE: 95 mg/dL (ref 70–99)
POTASSIUM: 4.1 meq/L (ref 3.5–5.1)
Sodium: 140 mEq/L (ref 135–145)
TOTAL PROTEIN: 6.9 g/dL (ref 6.0–8.3)

## 2016-04-22 LAB — TSH: TSH: 0.67 u[IU]/mL (ref 0.35–4.50)

## 2016-04-22 MED ORDER — TELMISARTAN-HCTZ 80-12.5 MG PO TABS: 1.0000 | ORAL_TABLET | Freq: Every day | ORAL | 1 refills | Status: DC

## 2016-04-22 NOTE — Progress Notes (Signed)
Pre visit review using our clinic review tool, if applicable. No additional management support is needed unless otherwise documented below in the visit note. 

## 2016-04-22 NOTE — Patient Instructions (Signed)
Hypertension Hypertension, commonly called high blood pressure, is when the force of blood pumping through your arteries is too strong. Your arteries are the blood vessels that carry blood from your heart throughout your body. A blood pressure reading consists of a higher number over a lower number, such as 110/72. The higher number (systolic) is the pressure inside your arteries when your heart pumps. The lower number (diastolic) is the pressure inside your arteries when your heart relaxes. Ideally you want your blood pressure below 120/80. Hypertension forces your heart to work harder to pump blood. Your arteries may become narrow or stiff. Having untreated or uncontrolled hypertension can cause heart attack, stroke, kidney disease, and other problems. What increases the risk? Some risk factors for high blood pressure are controllable. Others are not. Risk factors you cannot control include:  Race. You may be at higher risk if you are African American.  Age. Risk increases with age.  Gender. Men are at higher risk than women before age 45 years. After age 65, women are at higher risk than men. Risk factors you can control include:  Not getting enough exercise or physical activity.  Being overweight.  Getting too much fat, sugar, calories, or salt in your diet.  Drinking too much alcohol. What are the signs or symptoms? Hypertension does not usually cause signs or symptoms. Extremely high blood pressure (hypertensive crisis) may cause headache, anxiety, shortness of breath, and nosebleed. How is this diagnosed? To check if you have hypertension, your health care provider will measure your blood pressure while you are seated, with your arm held at the level of your heart. It should be measured at least twice using the same arm. Certain conditions can cause a difference in blood pressure between your right and left arms. A blood pressure reading that is higher than normal on one occasion does  not mean that you need treatment. If it is not clear whether you have high blood pressure, you may be asked to return on a different day to have your blood pressure checked again. Or, you may be asked to monitor your blood pressure at home for 1 or more weeks. How is this treated? Treating high blood pressure includes making lifestyle changes and possibly taking medicine. Living a healthy lifestyle can help lower high blood pressure. You may need to change some of your habits. Lifestyle changes may include:  Following the DASH diet. This diet is high in fruits, vegetables, and whole grains. It is low in salt, red meat, and added sugars.  Keep your sodium intake below 2,300 mg per day.  Getting at least 30-45 minutes of aerobic exercise at least 4 times per week.  Losing weight if necessary.  Not smoking.  Limiting alcoholic beverages.  Learning ways to reduce stress. Your health care provider may prescribe medicine if lifestyle changes are not enough to get your blood pressure under control, and if one of the following is true:  You are 18-59 years of age and your systolic blood pressure is above 140.  You are 60 years of age or older, and your systolic blood pressure is above 150.  Your diastolic blood pressure is above 90.  You have diabetes, and your systolic blood pressure is over 140 or your diastolic blood pressure is over 90.  You have kidney disease and your blood pressure is above 140/90.  You have heart disease and your blood pressure is above 140/90. Your personal target blood pressure may vary depending on your medical   conditions, your age, and other factors. Follow these instructions at home:  Have your blood pressure rechecked as directed by your health care provider.  Take medicines only as directed by your health care provider. Follow the directions carefully. Blood pressure medicines must be taken as prescribed. The medicine does not work as well when you skip  doses. Skipping doses also puts you at risk for problems.  Do not smoke.  Monitor your blood pressure at home as directed by your health care provider. Contact a health care provider if:  You think you are having a reaction to medicines taken.  You have recurrent headaches or feel dizzy.  You have swelling in your ankles.  You have trouble with your vision. Get help right away if:  You develop a severe headache or confusion.  You have unusual weakness, numbness, or feel faint.  You have severe chest or abdominal pain.  You vomit repeatedly.  You have trouble breathing. This information is not intended to replace advice given to you by your health care provider. Make sure you discuss any questions you have with your health care provider. Document Released: 05/11/2005 Document Revised: 10/17/2015 Document Reviewed: 03/03/2013 Elsevier Interactive Patient Education  2017 [employer]  

## 2016-04-22 NOTE — Progress Notes (Signed)
Subjective:  Patient ID: Adriana Young, female    DOB: March 05, 1961  Age: 55 y.o. MRN: [id number]  CC: Hypertension and Hypothyroidism   HPI Edid Fuster presents for a BP check - she tells me that her BP has not been very well controlled but she feels well and offers no complaints. She has intentionally lost weight but has not been exercising recently. She denies CP, DOE, SOB, edema, palpitations, or fatigue.  Outpatient Medications Prior to Visit  Medication Sig Dispense Refill  . amLODipine (NORVASC) 5 MG tablet TAKE 1 TABLET (5 MG TOTAL) BY MOUTH DAILY. 90 tablet 1  . atorvastatin (LIPITOR) 80 MG tablet TAKE 1 TABLET (80 MG TOTAL) BY MOUTH DAILY. 90 tablet 2  . levothyroxine (SYNTHROID, LEVOTHROID) 125 MCG tablet TAKE 1 TABLET (125 MCG TOTAL) BY MOUTH DAILY. 90 tablet 1  . Lifitegrast (XIIDRA) 5 % SOLN Apply to eye.    . ondansetron (ZOFRAN ODT) 4 MG disintegrating tablet Take 1 tablet (4 mg total) by mouth every 8 (eight) hours as needed for nausea or vomiting. 10 tablet 0  . traMADol (ULTRAM) 50 MG tablet Take 1 tablet (50 mg total) by mouth every 6 (six) hours as needed. 65 tablet 3  . indomethacin (INDOCIN) 50 MG capsule TAKE 1 CAPSULE BY MOUTH 3 TIMES DAILY WITH MEALS. 60 capsule 3  . levonorgestrel-ethinyl estradiol (ORSYTHIA) 0.1-20 MG-MCG tablet Take 1 tablet by mouth daily.    Marland Kitchen losartan (COZAAR) 100 MG tablet TAKE 1 TABLET (100 MG TOTAL) BY MOUTH DAILY. 90 tablet 0  . Multiple Vitamin (MULTIVITAMIN) tablet Take 1 tablet by mouth daily.      Marland Kitchen gi cocktail (Maalox,Lidocaine,Donnatal)      No facility-administered medications prior to visit.     ROS Review of Systems  Constitutional: Negative for appetite change, chills, diaphoresis, fatigue and unexpected weight change.  HENT: Negative.   Eyes: Negative.  Negative for visual disturbance.  Respiratory: Negative.  Negative for cough, chest tightness, shortness of breath, wheezing and stridor.   Cardiovascular: Negative for chest  pain, palpitations and leg swelling.  Gastrointestinal: Negative for abdominal pain, constipation, diarrhea, nausea and vomiting.  Endocrine: Negative.   Genitourinary: Negative.  Negative for decreased urine volume, difficulty urinating, dysuria, frequency and urgency.  Musculoskeletal: Negative.   Skin: Negative.   Allergic/Immunologic: Negative.   Neurological: Negative.  Negative for dizziness, weakness, light-headedness, numbness and headaches.  Hematological: Negative.   Psychiatric/Behavioral: Negative.     Objective:  BP (!) 144/94 (BP Location: Left Arm, Patient Position: Sitting, Cuff Size: Large)   Pulse 60   Temp 99 F (37.2 C) (Oral)   Resp 16   Ht 5\' 9"  (1.753 m)   Wt 196 lb 12 oz (89.2 kg)   LMP 10/25/2014   SpO2 98%   BMI 29.05 kg/m   BP Readings from Last 3 Encounters:  04/22/16 (!) 144/94  11/19/15 122/82  10/14/15 110/70    Wt Readings from Last 3 Encounters:  04/22/16 196 lb 12 oz (89.2 kg)  11/19/15 205 lb 9.6 oz (93.3 kg)  10/14/15 207 lb (93.9 kg)    Physical Exam  Constitutional: She is oriented to person, place, and time. No distress.  HENT:  Mouth/Throat: Oropharynx is clear and moist. No oropharyngeal exudate.  Eyes: Conjunctivae are normal. Right eye exhibits no discharge. Left eye exhibits no discharge. No scleral icterus.  Neck: Normal range of motion. Neck supple. No JVD present. No tracheal deviation present. No thyromegaly present.  Cardiovascular: Normal rate,  regular rhythm, normal heart sounds and intact distal pulses.  Exam reveals no gallop and no friction rub.   No murmur heard. Pulmonary/Chest: Effort normal and breath sounds normal. No stridor. No respiratory distress. She has no wheezes. She has no rales. She exhibits no tenderness.  Abdominal: Soft. Bowel sounds are normal. She exhibits no distension and no mass. There is no tenderness. There is no rebound and no guarding.  Musculoskeletal: Normal range of motion. She exhibits  no edema, tenderness or deformity.  Lymphadenopathy:    She has no cervical adenopathy.  Neurological: She is oriented to person, place, and time.  Skin: Skin is warm and dry. No rash noted. She is not diaphoretic. No erythema. No pallor.  Vitals reviewed.   Lab Results  Component Value Date   WBC 9.9 11/19/2015   HGB 16.7 (A) 11/19/2015   HCT 47.2 11/19/2015   PLT 210.0 11/01/2014   GLUCOSE 95 04/22/2016   CHOL 168 10/14/2015   TRIG 103.0 10/14/2015   HDL 38.30 (L) 10/14/2015   LDLDIRECT 189.4 07/18/2009   LDLCALC 109 (H) 10/14/2015   ALT 45 (H) 04/22/2016   AST 29 04/22/2016   NA 140 04/22/2016   K 4.1 04/22/2016   CL 105 04/22/2016   CREATININE 1.00 04/22/2016   BUN 18 04/22/2016   CO2 30 04/22/2016   TSH 0.67 04/22/2016   HGBA1C 5.7 08/11/2012   MICROALBUR 0.4 08/11/2012    Ct Abdomen Pelvis W Contrast  Result Date: 11/19/2015 CLINICAL DATA:  Right lower quadrant pain with hematuria EXAM: CT ABDOMEN AND PELVIS WITH CONTRAST TECHNIQUE: Multidetector CT imaging of the abdomen and pelvis was performed using the standard protocol following bolus administration of intravenous contrast. CONTRAST:  132mL ISOVUE-300 IOPAMIDOL (ISOVUE-300) INJECTION 61% COMPARISON:  None. FINDINGS: Lower chest:  No acute findings. Hepatobiliary: No masses or other significant abnormality. Pancreas: No mass, inflammatory changes, or other significant abnormality. Spleen: Within normal limits in size and appearance. Adrenals/Urinary Tract: No masses identified. No evidence of hydronephrosis. Tiny nonobstructing stone is noted on the left measuring 2 mm. Stomach/Bowel: No evidence of obstruction, inflammatory process, or abnormal fluid collections. The appendix is within normal limits. Vascular/Lymphatic: No pathologically enlarged lymph nodes. No evidence of abdominal aortic aneurysm. Mild aortoiliac calcifications are noted. Reproductive: No mass or other significant abnormality. Other: None.  Musculoskeletal:  No suspicious bone lesions identified. IMPRESSION: No acute abnormality noted. Tiny 2 mm nonobstructing left renal stone. Electronically Signed   By: Inez Catalina M.D.   On: 11/19/2015 21:03    Assessment & Plan:   Trinnity was seen today for hypertension and hypothyroidism.  Diagnoses and all orders for this visit:  Fatty liver- some improvement noted with weight loss -     Comprehensive metabolic panel; Future  Acquired hypothyroidism- her TSH is in the normal range, will cont the current T4 dose -     TSH; Future  Essential hypertension- her BP is not well controlled, will upgrade to a more potent ARB, will add a thiazide diuretic, will cont the CCB -     telmisartan-hydrochlorothiazide (MICARDIS HCT) 80-12.5 MG tablet; Take 1 tablet by mouth daily. -     Comprehensive metabolic panel; Future   I have discontinued Ms. Sanguinetti multivitamin, levonorgestrel-ethinyl estradiol, indomethacin, and losartan. I am also having her start on telmisartan-hydrochlorothiazide. Additionally, I am having her maintain her levothyroxine, traMADol, Lifitegrast, ondansetron, atorvastatin, and amLODipine. We will stop administering gi cocktail.  Meds ordered this encounter  Medications  . telmisartan-hydrochlorothiazide (MICARDIS  HCT) 80-12.5 MG tablet    Sig: Take 1 tablet by mouth daily.    Dispense:  90 tablet    Refill:  1     Follow-up: Return in about 3 months (around 07/22/2016).  Scarlette Calico, MD

## 2016-05-04 ENCOUNTER — Other Ambulatory Visit: Payer: Self-pay | Admitting: Internal Medicine

## 2016-06-02 ENCOUNTER — Other Ambulatory Visit: Payer: Self-pay | Admitting: Internal Medicine

## 2016-06-26 ENCOUNTER — Other Ambulatory Visit: Payer: Self-pay | Admitting: Internal Medicine

## 2016-06-26 DIAGNOSIS — G8929 Other chronic pain: Secondary | ICD-10-CM

## 2016-06-26 DIAGNOSIS — M545 Low back pain, unspecified: Secondary | ICD-10-CM

## 2016-06-26 DIAGNOSIS — M25552 Pain in left hip: Principal | ICD-10-CM

## 2016-06-26 NOTE — Telephone Encounter (Signed)
rx faxed to [facility] ([facility]).

## 2016-10-09 ENCOUNTER — Other Ambulatory Visit: Payer: Self-pay | Admitting: Internal Medicine

## 2016-10-09 DIAGNOSIS — I10 Essential (primary) hypertension: Secondary | ICD-10-CM

## 2016-10-09 NOTE — Telephone Encounter (Signed)
Different PCP, is this appropriate . Please advise

## 2016-10-25 ENCOUNTER — Other Ambulatory Visit: Payer: Self-pay | Admitting: Internal Medicine

## 2016-10-27 ENCOUNTER — Encounter: Payer: Self-pay | Admitting: Internal Medicine

## 2016-10-27 ENCOUNTER — Ambulatory Visit (INDEPENDENT_AMBULATORY_CARE_PROVIDER_SITE_OTHER): Payer: Managed Care, Other (non HMO) | Admitting: Internal Medicine

## 2016-10-27 ENCOUNTER — Other Ambulatory Visit (INDEPENDENT_AMBULATORY_CARE_PROVIDER_SITE_OTHER): Payer: Managed Care, Other (non HMO)

## 2016-10-27 VITALS — BP 148/88 | HR 60 | Temp 98.9°F | Wt 194.0 lb

## 2016-10-27 DIAGNOSIS — Z0000 Encounter for general adult medical examination without abnormal findings: Secondary | ICD-10-CM

## 2016-10-27 DIAGNOSIS — G8929 Other chronic pain: Secondary | ICD-10-CM | POA: Diagnosis not present

## 2016-10-27 DIAGNOSIS — M159 Polyosteoarthritis, unspecified: Secondary | ICD-10-CM | POA: Diagnosis not present

## 2016-10-27 DIAGNOSIS — M545 Low back pain, unspecified: Secondary | ICD-10-CM

## 2016-10-27 DIAGNOSIS — E039 Hypothyroidism, unspecified: Secondary | ICD-10-CM | POA: Diagnosis not present

## 2016-10-27 DIAGNOSIS — I10 Essential (primary) hypertension: Secondary | ICD-10-CM | POA: Diagnosis not present

## 2016-10-27 DIAGNOSIS — M1612 Unilateral primary osteoarthritis, left hip: Secondary | ICD-10-CM | POA: Diagnosis not present

## 2016-10-27 DIAGNOSIS — M25552 Pain in left hip: Secondary | ICD-10-CM

## 2016-10-27 LAB — CBC WITH DIFFERENTIAL/PLATELET
BASOS PCT: 0.3 % (ref 0.0–3.0)
Basophils Absolute: 0 10*3/uL (ref 0.0–0.1)
EOS PCT: 2.3 % (ref 0.0–5.0)
Eosinophils Absolute: 0.2 10*3/uL (ref 0.0–0.7)
HCT: 44 % (ref 36.0–46.0)
Hemoglobin: 15 g/dL (ref 12.0–15.0)
Lymphocytes Relative: 32.3 % (ref 12.0–46.0)
Lymphs Abs: 2.4 10*3/uL (ref 0.7–4.0)
MCHC: 34.1 g/dL (ref 30.0–36.0)
MCV: 91.2 fl (ref 78.0–100.0)
MONOS PCT: 7.7 % (ref 3.0–12.0)
Monocytes Absolute: 0.6 10*3/uL (ref 0.1–1.0)
NEUTROS ABS: 4.2 10*3/uL (ref 1.4–7.7)
Neutrophils Relative %: 57.4 % (ref 43.0–77.0)
PLATELETS: 210 10*3/uL (ref 150.0–400.0)
RBC: 4.83 Mil/uL (ref 3.87–5.11)
RDW: 13 % (ref 11.5–15.5)
WBC: 7.4 10*3/uL (ref 4.0–10.5)

## 2016-10-27 LAB — TSH: TSH: 1.47 u[IU]/mL (ref 0.35–4.50)

## 2016-10-27 LAB — LIPID PANEL
CHOL/HDL RATIO: 3
Cholesterol: 168 mg/dL (ref 0–200)
HDL: 56.5 mg/dL (ref >39.00)
LDL CALC: 90 mg/dL (ref 0–99)
NONHDL: 111.96
TRIGLYCERIDES: 112 mg/dL (ref 0.0–149.0)
VLDL: 22.4 mg/dL (ref 0.0–40.0)

## 2016-10-27 LAB — COMPREHENSIVE METABOLIC PANEL
ALT: 34 U/L (ref 0–35)
AST: 30 U/L (ref 0–37)
Albumin: 4.5 g/dL (ref 3.5–5.2)
Alkaline Phosphatase: 65 U/L (ref 39–117)
BILIRUBIN TOTAL: 0.7 mg/dL (ref 0.2–1.2)
BUN: 24 mg/dL — ABNORMAL HIGH (ref 6–23)
CALCIUM: 10.2 mg/dL (ref 8.4–10.5)
CHLORIDE: 103 meq/L (ref 96–112)
CO2: 29 meq/L (ref 19–32)
Creatinine, Ser: 0.97 mg/dL (ref 0.40–1.20)
GFR: 63.06 mL/min (ref >60.00)
GLUCOSE: 88 mg/dL (ref 70–99)
Potassium: 4.4 mEq/L (ref 3.5–5.1)
Sodium: 139 mEq/L (ref 135–145)
Total Protein: 7.4 g/dL (ref 6.0–8.3)

## 2016-10-27 MED ORDER — TRAMADOL HCL 50 MG PO TABS: 50.0000 mg | ORAL_TABLET | Freq: Four times a day (QID) | ORAL | 5 refills | Status: DC | PRN

## 2016-10-27 NOTE — Patient Instructions (Signed)

## 2016-10-27 NOTE — Progress Notes (Signed)
Subjective:  Patient ID: Adriana Young, female    DOB: 03/06/1961  Age: 56 y.o. MRN: [id number]  CC: Annual Exam; Hypertension; Hypothyroidism; and Hyperlipidemia   HPI Adriana Young presents for a CPX.  She complains of chronic musculoskeletal pain and wants a refill for tramadol. She particularly has pain in her left hips and wants to be referred to an orthopedist to see if she needs to consider having hip replacement surgery. She feels like her thyroid level is normal as she has had no recent episodes of fatigue, constipation, weight changes, or hot or cold intolerance. She also tells me her blood pressure has been well controlled.  Outpatient Medications Prior to Visit  Medication Sig Dispense Refill  . amLODipine (NORVASC) 5 MG tablet Take 1 tablet (5 mg total) by mouth daily. 90 tablet 0  . atorvastatin (LIPITOR) 80 MG tablet TAKE 1 TABLET (80 MG TOTAL) BY MOUTH DAILY. 90 tablet 1  . levothyroxine (SYNTHROID, LEVOTHROID) 125 MCG tablet TAKE 1 TABLET (125 MCG TOTAL) BY MOUTH DAILY. 90 tablet 0  . telmisartan-hydrochlorothiazide (MICARDIS HCT) 80-12.5 MG tablet Take 1 tablet by mouth daily. 90 tablet 0  . traMADol (ULTRAM) 50 MG tablet TAKE 1 TABLET BY MOUTH EVERY 6 HOURS AS NEEDED 65 tablet 2  . Lifitegrast (XIIDRA) 5 % SOLN Apply to eye.    . losartan (COZAAR) 100 MG tablet TAKE 1 TABLET (100 MG TOTAL) BY MOUTH DAILY. (Patient not taking: Reported on 10/27/2016) 90 tablet 1  . ondansetron (ZOFRAN ODT) 4 MG disintegrating tablet Take 1 tablet (4 mg total) by mouth every 8 (eight) hours as needed for nausea or vomiting. (Patient not taking: Reported on 10/27/2016) 10 tablet 0   No facility-administered medications prior to visit.     ROS Review of Systems  Constitutional: Negative.  Negative for appetite change, diaphoresis, fatigue and unexpected weight change.  HENT: Negative.  Negative for trouble swallowing.   Eyes: Negative for visual disturbance.  Respiratory: Negative.  Negative for  apnea, cough, chest tightness and shortness of breath.   Cardiovascular: Negative.  Negative for chest pain, palpitations and leg swelling.  Gastrointestinal: Negative.  Negative for abdominal pain, constipation, diarrhea, nausea and vomiting.  Endocrine: Negative.  Negative for cold intolerance and heat intolerance.  Genitourinary: Negative.  Negative for difficulty urinating, dysuria, frequency, urgency and vaginal bleeding.  Musculoskeletal: Positive for arthralgias and back pain. Negative for joint swelling, myalgias and neck stiffness.  Skin: Negative.  Negative for color change and rash.  Allergic/Immunologic: Negative.   Neurological: Negative.  Negative for dizziness, syncope and light-headedness.  Hematological: Negative for adenopathy. Does not bruise/bleed easily.  Psychiatric/Behavioral: Negative.     Objective:  BP (!) 148/88   Pulse 60   Temp 98.9 F (37.2 C)   Wt 194 lb (88 kg)   LMP 10/25/2014   SpO2 99%   BMI 28.65 kg/m   BP Readings from Last 3 Encounters:  10/27/16 (!) 148/88  04/22/16 (!) 144/94  11/19/15 122/82    Wt Readings from Last 3 Encounters:  10/27/16 194 lb (88 kg)  04/22/16 196 lb 12 oz (89.2 kg)  11/19/15 205 lb 9.6 oz (93.3 kg)    Physical Exam  Constitutional: She is oriented to person, place, and time. No distress.  HENT:  Mouth/Throat: Oropharynx is clear and moist. No oropharyngeal exudate.  Eyes: Conjunctivae are normal. Right eye exhibits no discharge. Left eye exhibits no discharge. No scleral icterus.  Neck: Normal range of motion. Neck supple. No  JVD present. No thyromegaly present.  Cardiovascular: Normal rate, regular rhythm and intact distal pulses.   No murmur heard. Pulmonary/Chest: Effort normal and breath sounds normal. No respiratory distress. She has no wheezes. She has no rales.  Abdominal: Soft. Bowel sounds are normal. She exhibits no mass. There is no tenderness. There is no guarding.  Musculoskeletal: Normal range  of motion. She exhibits no edema, tenderness or deformity.  Lymphadenopathy:    She has no cervical adenopathy.  Neurological: She is alert and oriented to person, place, and time.  Skin: Skin is warm and dry. No rash noted. She is not diaphoretic. No erythema. No pallor.  Vitals reviewed.   Lab Results  Component Value Date   WBC 7.4 10/27/2016   HGB 15.0 10/27/2016   HCT 44.0 10/27/2016   PLT 210.0 10/27/2016   GLUCOSE 88 10/27/2016   CHOL 168 10/27/2016   TRIG 112.0 10/27/2016   HDL 56.50 10/27/2016   LDLDIRECT 189.4 07/18/2009   LDLCALC 90 10/27/2016   ALT 34 10/27/2016   AST 30 10/27/2016   NA 139 10/27/2016   K 4.4 10/27/2016   CL 103 10/27/2016   CREATININE 0.97 10/27/2016   BUN 24 (H) 10/27/2016   CO2 29 10/27/2016   TSH 1.47 10/27/2016   HGBA1C 5.7 08/11/2012   MICROALBUR 0.4 08/11/2012    Ct Abdomen Pelvis W Contrast  Result Date: 11/19/2015 CLINICAL DATA:  Right lower quadrant pain with hematuria EXAM: CT ABDOMEN AND PELVIS WITH CONTRAST TECHNIQUE: Multidetector CT imaging of the abdomen and pelvis was performed using the standard protocol following bolus administration of intravenous contrast. CONTRAST:  123mL ISOVUE-300 IOPAMIDOL (ISOVUE-300) INJECTION 61% COMPARISON:  None. FINDINGS: Lower chest:  No acute findings. Hepatobiliary: No masses or other significant abnormality. Pancreas: No mass, inflammatory changes, or other significant abnormality. Spleen: Within normal limits in size and appearance. Adrenals/Urinary Tract: No masses identified. No evidence of hydronephrosis. Tiny nonobstructing stone is noted on the left measuring 2 mm. Stomach/Bowel: No evidence of obstruction, inflammatory process, or abnormal fluid collections. The appendix is within normal limits. Vascular/Lymphatic: No pathologically enlarged lymph nodes. No evidence of abdominal aortic aneurysm. Mild aortoiliac calcifications are noted. Reproductive: No mass or other significant abnormality.  Other: None. Musculoskeletal:  No suspicious bone lesions identified. IMPRESSION: No acute abnormality noted. Tiny 2 mm nonobstructing left renal stone. Electronically Signed   By: Inez Catalina M.D.   On: 11/19/2015 21:03    Assessment & Plan:   Amandy was seen today for annual exam, hypertension, hypothyroidism and hyperlipidemia.  Diagnoses and all orders for this visit:  Routine general medical examination at a health care facility- exam completed, labs ordered and reviewed, vaccines reviewed, she has an upcoming mammogram, her Pap is up-to-date, her colon cancer screening is up-to-date, patient education material was given. -     Lipid panel; Future -     Comprehensive metabolic panel; Future -     CBC with Differential/Platelet; Future -     TSH; Future  Essential hypertension- her blood pressure is well-controlled, electrolytes and renal function are normal.  Hypothyroidism, unspecified type- her TSH is in the normal range, she will remain on the current dose of levothyroxine.  Primary osteoarthritis of left hip -     traMADol (ULTRAM) 50 MG tablet; Take 1 tablet (50 mg total) by mouth every 6 (six) hours as needed. -     Ambulatory referral to Orthopedic Surgery  Generalized OA -     traMADol (ULTRAM) 50 MG  tablet; Take 1 tablet (50 mg total) by mouth every 6 (six) hours as needed.  Chronic left hip pain  Low back pain at multiple sites   I have discontinued Adriana Young Lifitegrast, ondansetron, and losartan. I have also changed her traMADol. Additionally, I am having her maintain her telmisartan-hydrochlorothiazide, amLODipine, levothyroxine, and atorvastatin.  Meds ordered this encounter  Medications  . traMADol (ULTRAM) 50 MG tablet    Sig: Take 1 tablet (50 mg total) by mouth every 6 (six) hours as needed.    Dispense:  65 tablet    Refill:  5    This request is for a new prescription for a controlled substance as required by Federal/State law.     Follow-up:  Return in about 6 months (around 04/28/2017).  Scarlette Calico, MD

## 2016-10-29 ENCOUNTER — Other Ambulatory Visit: Payer: Self-pay | Admitting: Internal Medicine

## 2016-12-16 ENCOUNTER — Ambulatory Visit (INDEPENDENT_AMBULATORY_CARE_PROVIDER_SITE_OTHER): Payer: Managed Care, Other (non HMO) | Admitting: Physician Assistant

## 2016-12-16 ENCOUNTER — Ambulatory Visit (INDEPENDENT_AMBULATORY_CARE_PROVIDER_SITE_OTHER): Payer: Managed Care, Other (non HMO)

## 2016-12-16 ENCOUNTER — Encounter: Payer: Self-pay | Admitting: Physician Assistant

## 2016-12-16 VITALS — BP 128/82 | HR 67 | Temp 98.9°F | Resp 17 | Ht 69.5 in | Wt 199.0 lb

## 2016-12-16 DIAGNOSIS — M7581 Other shoulder lesions, right shoulder: Secondary | ICD-10-CM | POA: Diagnosis not present

## 2016-12-16 DIAGNOSIS — R29898 Other symptoms and signs involving the musculoskeletal system: Secondary | ICD-10-CM

## 2016-12-16 DIAGNOSIS — M25511 Pain in right shoulder: Secondary | ICD-10-CM

## 2016-12-16 NOTE — Patient Instructions (Addendum)
  Please keep the phone close by so we may contact you regarding the MRI and referral.    CLINICAL DATA:  Acute right shoulder pain without known injury.  EXAM: RIGHT SHOULDER - 2+ VIEW  COMPARISON:  None.  FINDINGS: There is no evidence of fracture or dislocation. There is no evidence of arthropathy or other focal bone abnormality. Soft tissues are unremarkable.  IMPRESSION: Normal right shoulder.   Electronically Signed   By: Marijo Conception, M.D.   On: 12/16/2016 17:11   IF you received an x-ray today, you will receive an invoice from [facility]. Please contact [facility] at [phone] with questions or concerns regarding your invoice.   IF you received labwork today, you will receive an invoice from [facility]. Please contact [facility] at [phone] with questions or concerns regarding your invoice.   Our billing staff will not be able to assist you with questions regarding bills from these companies.  You will be contacted with the lab results as soon as they are available. The fastest way to get your results is to activate your My Chart account. Instructions are located on the last page of this paperwork. If you have not heard from [place] regarding the results in 2 weeks, please contact this office.

## 2016-12-16 NOTE — Progress Notes (Signed)
    12/16/2016 4:59 PM   DOB: Oct 19, 1960 / MRN: [id number]  SUBJECTIVE:  Adriana Young is a 56 y.o. female presenting for right shoulder pain that started on Saturday.  Tells me that she is unable to lift up her arm all the way.  Denies neck pain.  Denies pain below the elbow. Works overhead activities quite a lot at her work at [employer].  Is in PT right now for chronic low back pain.     She has No Known Allergies.   She  has a past medical history of Allergy; GERD (gastroesophageal reflux disease); Glaucoma; Headache(784.0); Hyperlipidemia; Hypertension; Osteoarthritis; Thyroid disease; and Type II or unspecified type diabetes mellitus without mention of complication, uncontrolled (09/19/2010).    She  reports that she quit smoking about 16 years ago. She has never used smokeless tobacco. She reports that she does not drink alcohol or use drugs. She  reports that she currently engages in sexual activity. She reports using the following method of birth control/protection: Pill. The patient  has a past surgical history that includes Breast surgery.  Her family history includes Alcohol abuse in her other; Arthritis in her other; Diabetes in her mother and other; Heart disease in her mother; Hyperlipidemia in her other; Hypertension in her other; Stroke in her other.  Review of Systems  Constitutional: Negative for chills, diaphoresis and fever.  Eyes: Negative.   Gastrointestinal: Negative for nausea.  Skin: Negative for rash.  Neurological: Negative for dizziness, tingling, sensory change, speech change and headaches.    The problem list and medications were reviewed and updated by myself where necessary and exist elsewhere in the encounter.   OBJECTIVE:  BP 128/82   Pulse 67   Temp 98.9 F (37.2 C) (Oral)   Resp 17   Ht 5' 9.5" (1.765 m)   Wt 199 lb (90.3 kg)   LMP 10/25/2014   SpO2 98%   BMI 28.97 kg/m   Physical Exam  Constitutional: She is active.  Non-toxic appearance.    Cardiovascular: Normal rate.   Pulmonary/Chest: Effort normal. No tachypnea.  Musculoskeletal:       Arms: Neurological: She is alert.  Skin: Skin is warm and dry. She is not diaphoretic. No pallor.    No results found for this or any previous visit (from the past 72 hour(s)).  No results found.  ASSESSMENT AND PLAN:  Adriana Young was seen today for shoulder pain and arm pain.  Diagnoses and all orders for this visit:  Acute pain of right shoulder -     DG Shoulder Right; Future -     Shoulder immobilizer  Rotator cuff tendinitis, right: Exam point to a tear of the supraspinatus.  Will refer and obtain an MRI as I think that she will probably require surgery.  Work restrictions given today.   Shoulder weakness: See problem two.  -     MR SHOULDER RIGHT W CONTRAST; Future -     Ambulatory referral to Orthopedic Surgery    The patient is advised to call or return to clinic if she does not see an improvement in symptoms, or to seek the care of the closest emergency department if she worsens with the above plan.   Philis Fendt, MHS, PA-C [facility] 12/16/2016 4:59 PM

## 2016-12-19 ENCOUNTER — Telehealth: Payer: Self-pay | Admitting: Physician Assistant

## 2016-12-19 NOTE — Telephone Encounter (Signed)
I asked pt if she was here to have FMLA filled out and she said no. When looking at the paperwork further, it looks like it might be. I will put this in Adriana Young's box to have her determine if this needs to be FMLA or not.

## 2016-12-19 NOTE — Telephone Encounter (Signed)
Pt seen on 7/25 by Philis Fendt. The pt was given paperwork from her job for request for accomodation to be filled out based on her last visit. I am placing this paperwork in Michael's box. Please advise.

## 2016-12-21 NOTE — Telephone Encounter (Signed)
Patient needs Accommodation forms completed by Philis Fendt for her right shoulder pain that he treated her for. I have completed what I could from the [place] notes and highlighted the areas that need to be finished, I will place the forms in your box on 12/21/16 if you could please return them to the FMLA/Disability box at the [employer] checkout desk within in 5-7 business days. Thank you!

## 2016-12-21 NOTE — Telephone Encounter (Signed)
FYI

## 2016-12-22 NOTE — Telephone Encounter (Signed)
Will complete and sign as soon as it hits my inbox. Philis Fendt, MS, PA-C 9:20 AM, 12/22/2016

## 2016-12-22 NOTE — Telephone Encounter (Signed)
Paperwork scanned and faxed on 12/22/16

## 2016-12-28 ENCOUNTER — Telehealth: Payer: Self-pay | Admitting: Physician Assistant

## 2016-12-28 NOTE — Telephone Encounter (Signed)
I referred her to ortho.  Please call her and make sure that she has heard from them.  If she has follow up in place with them we can let the image go for now.  If she does not have follow up please find out what happened and get her back on track.  Thanks [employer]!!

## 2016-12-28 NOTE — Telephone Encounter (Signed)
Prior auth for pt MRI Shoulder Right With Contrast has been denied by [employer]. A peer-to-peer can be requested by calling [phone]. After this, an appeal can be initiated if needed within 180 days of receiving this decision on 12/25/16. More information on how to start this process as well as an explanation of this decision can be found in fax in provider's box. Please advise. Thanks!

## 2016-12-29 NOTE — Telephone Encounter (Signed)
Left message for patient to call back regarding ortho appointment.

## 2017-01-23 ENCOUNTER — Other Ambulatory Visit: Payer: Self-pay | Admitting: Internal Medicine

## 2017-01-24 ENCOUNTER — Other Ambulatory Visit: Payer: Self-pay | Admitting: Internal Medicine

## 2017-01-24 DIAGNOSIS — I10 Essential (primary) hypertension: Secondary | ICD-10-CM

## 2017-01-27 ENCOUNTER — Other Ambulatory Visit: Payer: Self-pay | Admitting: Internal Medicine

## 2017-01-27 DIAGNOSIS — I10 Essential (primary) hypertension: Secondary | ICD-10-CM

## 2017-04-26 ENCOUNTER — Other Ambulatory Visit: Payer: Self-pay | Admitting: Internal Medicine

## 2017-04-26 ENCOUNTER — Encounter: Payer: Self-pay | Admitting: Internal Medicine

## 2017-05-06 ENCOUNTER — Other Ambulatory Visit: Payer: Self-pay | Admitting: Internal Medicine

## 2017-05-11 ENCOUNTER — Other Ambulatory Visit: Payer: Self-pay | Admitting: Internal Medicine

## 2017-05-13 ENCOUNTER — Other Ambulatory Visit: Payer: Self-pay | Admitting: Internal Medicine

## 2017-05-13 DIAGNOSIS — I10 Essential (primary) hypertension: Secondary | ICD-10-CM

## 2017-05-16 ENCOUNTER — Other Ambulatory Visit: Payer: Self-pay | Admitting: Internal Medicine

## 2017-05-16 DIAGNOSIS — M1612 Unilateral primary osteoarthritis, left hip: Secondary | ICD-10-CM

## 2017-05-16 DIAGNOSIS — M159 Polyosteoarthritis, unspecified: Secondary | ICD-10-CM

## 2017-05-17 ENCOUNTER — Other Ambulatory Visit: Payer: Self-pay | Admitting: Internal Medicine

## 2017-05-17 DIAGNOSIS — I10 Essential (primary) hypertension: Secondary | ICD-10-CM

## 2017-08-12 ENCOUNTER — Other Ambulatory Visit: Payer: Self-pay | Admitting: Internal Medicine

## 2017-08-22 ENCOUNTER — Other Ambulatory Visit: Payer: Self-pay | Admitting: Internal Medicine

## 2017-08-22 DIAGNOSIS — M1612 Unilateral primary osteoarthritis, left hip: Secondary | ICD-10-CM

## 2017-08-22 DIAGNOSIS — M159 Polyosteoarthritis, unspecified: Secondary | ICD-10-CM

## 2017-08-24 IMAGING — DX DG LUMBAR SPINE COMPLETE 4+V
5 series · 5 of 5 positions shown · non-contrast
Comparison: 09/05/2008 MRI

CLINICAL DATA: Lower back pain AND lt hip pain, x 6 mos, NKI.

EXAM:
LUMBAR SPINE - COMPLETE 4+ VIEW

[l-spine ap]
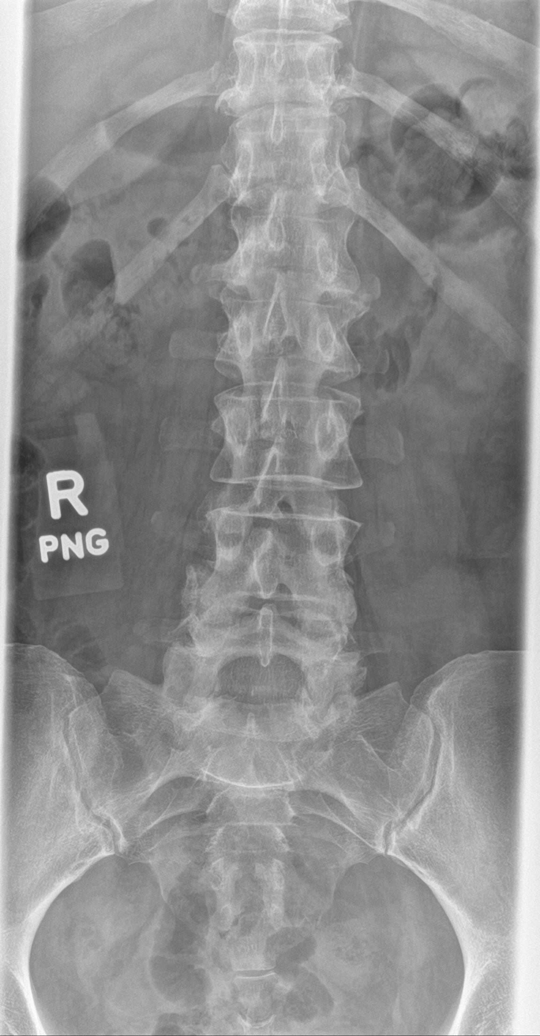

[l-spine obl (1 of 2)]
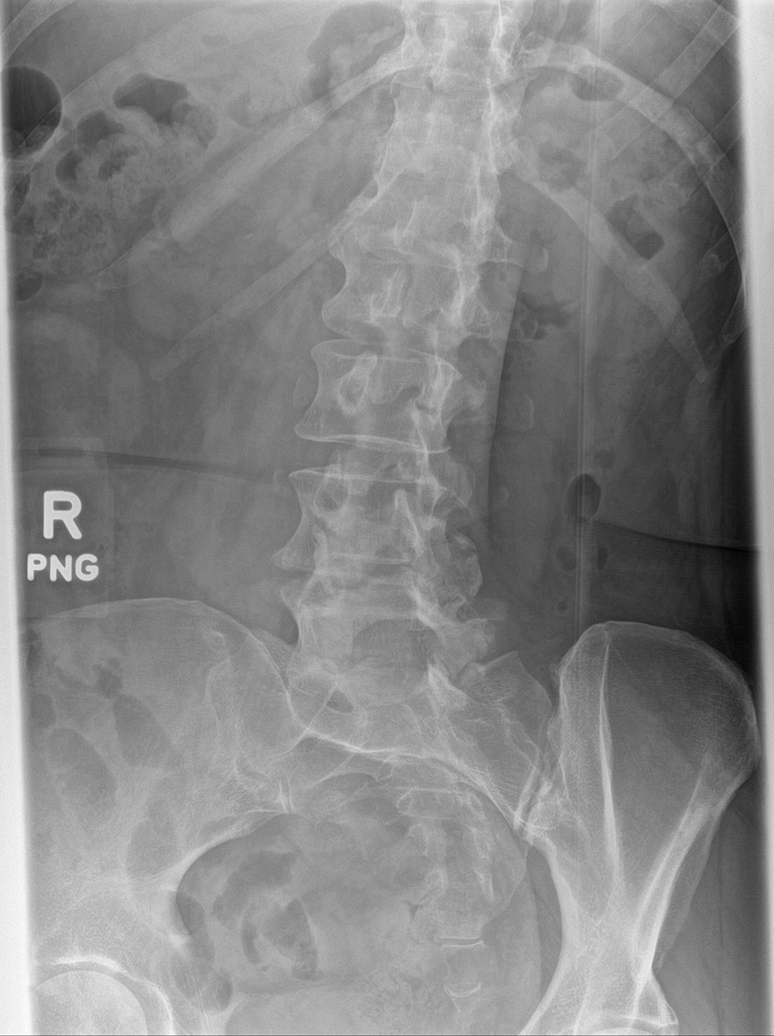

[l-spine obl (2 of 2)]
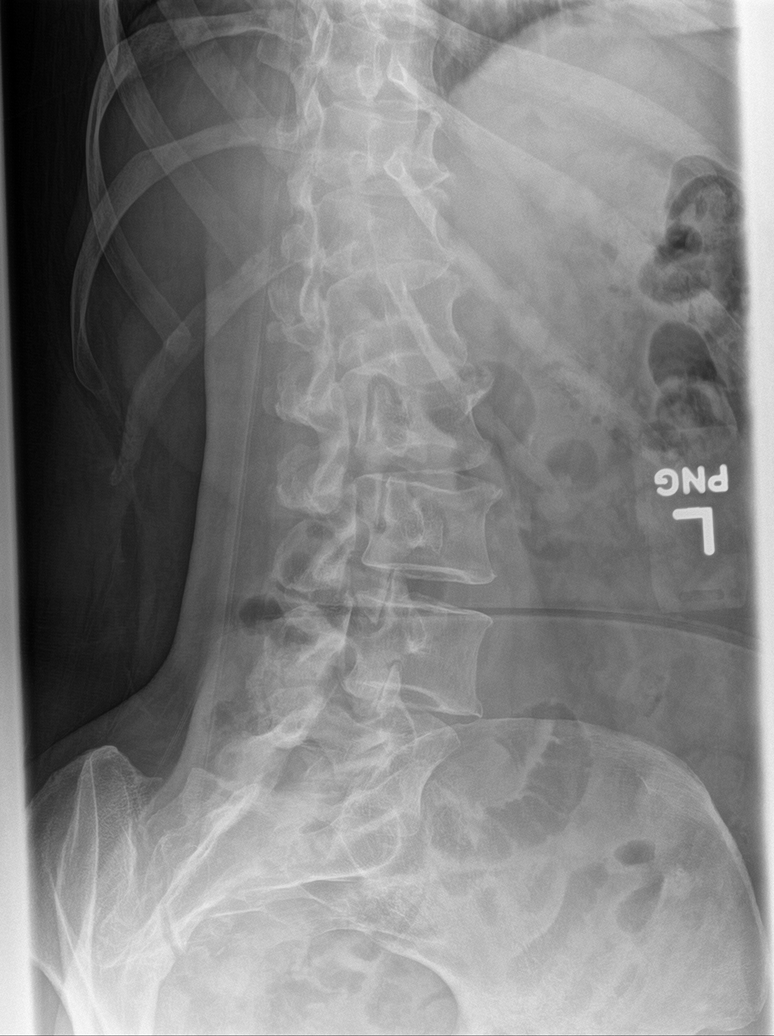

[l-spine lat]
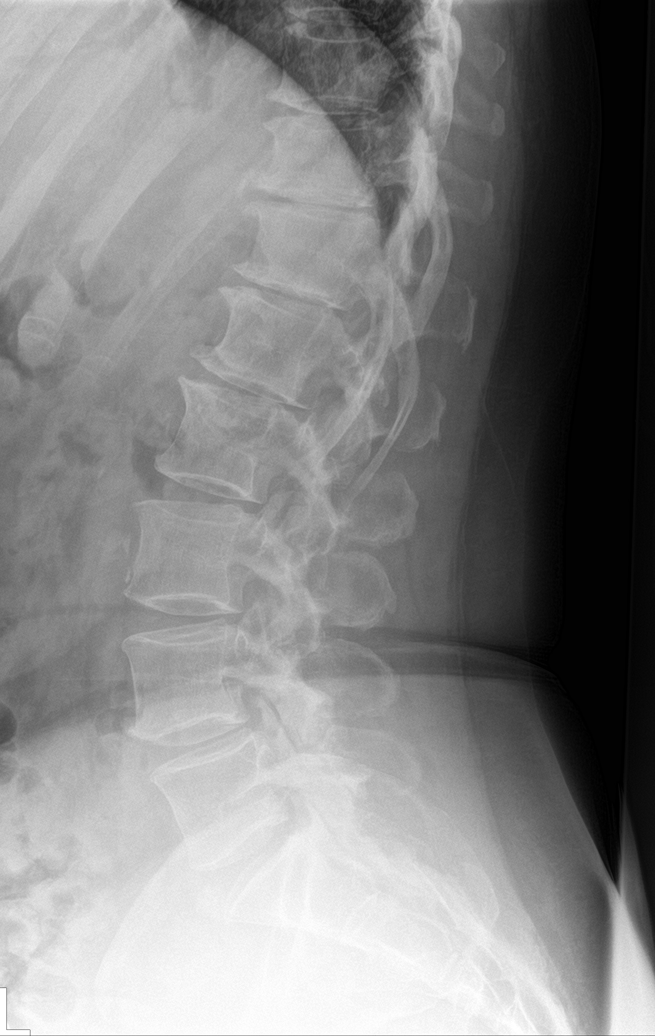

[l-spine spot]
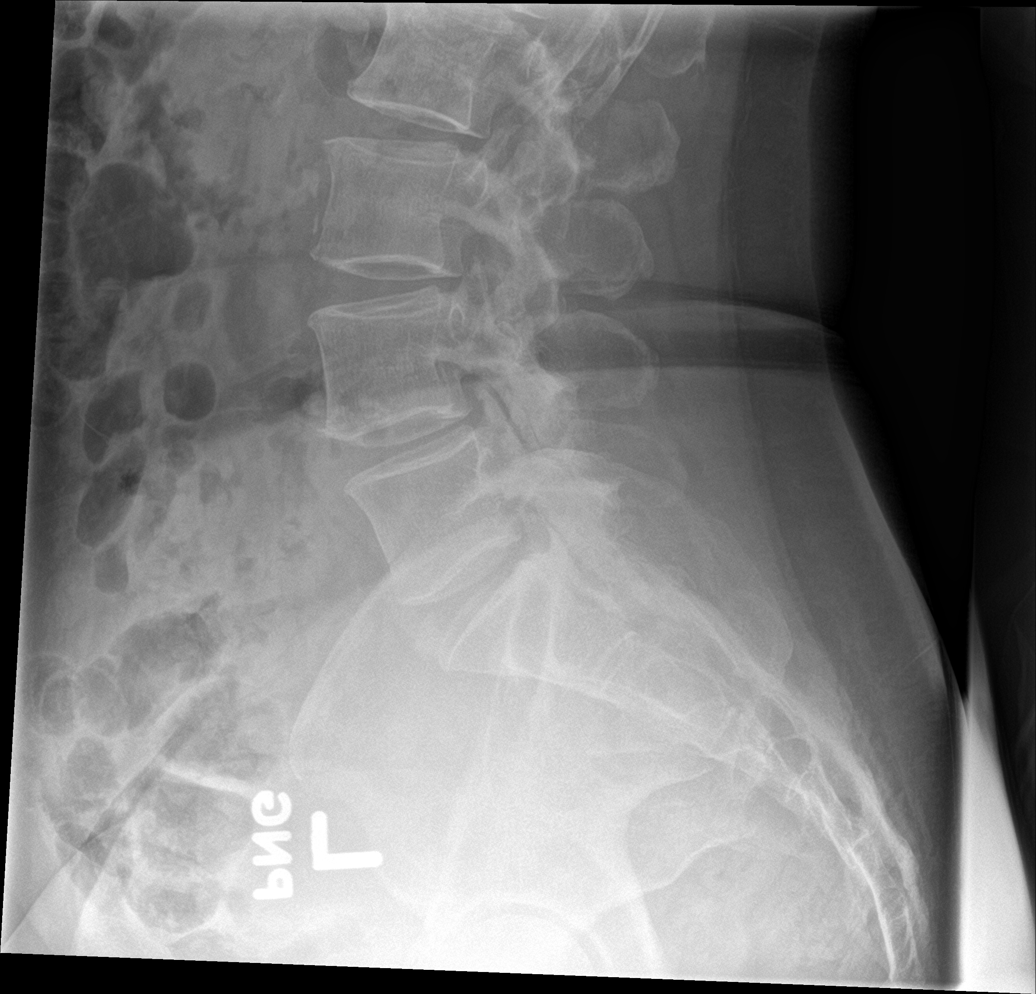

[5 of 5 positions shown; findings below may reference images not displayed]

FINDINGS: Five lumbar type vertebral bodies. Sacroiliac joints are symmetric.
Maintenance of vertebral body height and alignment. Spondylosis
including endplate osteophytes within the lower thoracic and upper
lumbar spine. Facet arthropathy at L4-5 and L5-S1.
IMPRESSION: Lumbosacral spondylosis, without acute osseous abnormality.

## 2017-08-30 ENCOUNTER — Emergency Department (HOSPITAL_COMMUNITY): Payer: Managed Care, Other (non HMO)

## 2017-08-30 ENCOUNTER — Emergency Department (HOSPITAL_COMMUNITY)
Admission: EM | Admit: 2017-08-30 | Discharge: 2017-08-30 | Disposition: A | Discharge status: Home / Self Care | Payer: Managed Care, Other (non HMO) | Attending: Emergency Medicine | Admitting: Emergency Medicine

## 2017-08-30 ENCOUNTER — Encounter: Payer: Self-pay | Admitting: Gastroenterology

## 2017-08-30 ENCOUNTER — Encounter (HOSPITAL_COMMUNITY): Payer: Self-pay

## 2017-08-30 ENCOUNTER — Other Ambulatory Visit: Payer: Self-pay

## 2017-08-30 DIAGNOSIS — Z79899 Other long term (current) drug therapy: Secondary | ICD-10-CM | POA: Insufficient documentation

## 2017-08-30 DIAGNOSIS — E119 Type 2 diabetes mellitus without complications: Secondary | ICD-10-CM | POA: Insufficient documentation

## 2017-08-30 DIAGNOSIS — R1013 Epigastric pain: Secondary | ICD-10-CM

## 2017-08-30 DIAGNOSIS — Z87891 Personal history of nicotine dependence: Secondary | ICD-10-CM | POA: Insufficient documentation

## 2017-08-30 DIAGNOSIS — I10 Essential (primary) hypertension: Secondary | ICD-10-CM | POA: Diagnosis not present

## 2017-08-30 LAB — COMPREHENSIVE METABOLIC PANEL
ALBUMIN: 4.3 g/dL (ref 3.5–5.0)
ALT: 24 U/L (ref 14–54)
AST: 23 U/L (ref 15–41)
Alkaline Phosphatase: 67 U/L (ref 38–126)
Anion gap: 12 (ref 5–15)
BUN: 16 mg/dL (ref 6–20)
CHLORIDE: 105 mmol/L (ref 101–111)
CO2: 22 mmol/L (ref 22–32)
CREATININE: 0.94 mg/dL (ref 0.44–1.00)
Calcium: 9.6 mg/dL (ref 8.9–10.3)
GFR calc non Af Amer: >60 mL/min (ref >60)
GLUCOSE: 108 mg/dL — AB (ref 65–99)
Potassium: 3.9 mmol/L (ref 3.5–5.1)
SODIUM: 139 mmol/L (ref 135–145)
Total Bilirubin: 0.6 mg/dL (ref 0.3–1.2)
Total Protein: 7.4 g/dL (ref 6.5–8.1)

## 2017-08-30 LAB — CBC
HCT: 47.2 % — ABNORMAL HIGH (ref 36.0–46.0)
Hemoglobin: 16.2 g/dL — ABNORMAL HIGH (ref 12.0–15.0)
MCH: 31.7 pg (ref 26.0–34.0)
MCHC: 34.3 g/dL (ref 30.0–36.0)
MCV: 92.4 fL (ref 78.0–100.0)
Platelets: 214 10*3/uL (ref 150–400)
RBC: 5.11 MIL/uL (ref 3.87–5.11)
RDW: 12.3 % (ref 11.5–15.5)
WBC: 9.5 10*3/uL (ref 4.0–10.5)

## 2017-08-30 LAB — LIPASE, BLOOD: LIPASE: 31 U/L (ref 11–51)

## 2017-08-30 LAB — URINALYSIS, ROUTINE W REFLEX MICROSCOPIC
Bilirubin Urine: NEGATIVE
Glucose, UA: NEGATIVE mg/dL
KETONES UR: NEGATIVE mg/dL
Leukocytes, UA: NEGATIVE
Nitrite: NEGATIVE
PROTEIN: NEGATIVE mg/dL
Specific Gravity, Urine: 1.018 (ref 1.005–1.030)
pH: 7 (ref 5.0–8.0)

## 2017-08-30 MED ORDER — FAMOTIDINE 20 MG PO TABS: 20.0000 mg | ORAL_TABLET | Freq: Two times a day (BID) | ORAL | 0 refills | Status: DC

## 2017-08-30 MED ORDER — KETOROLAC TROMETHAMINE 15 MG/ML IJ SOLN
15.0000 mg | Freq: Once | INTRAMUSCULAR | Status: AC
  Administered 2017-08-30: 15 mg via INTRAVENOUS
  Filled 2017-08-30: qty 1

## 2017-08-30 NOTE — ED Triage Notes (Addendum)
Patient c/o upper abdominal pain x 2 days. Patient states she has been taking antacids with no relief. Patient reports SOB when pain occurs.

## 2017-08-30 NOTE — ED Notes (Signed)
Bed: WA06 Expected date:  Expected time:  Means of arrival:  Comments: 

## 2017-08-30 NOTE — ED Provider Notes (Signed)
[facility] DEPT Provider Note   CSN: [id number] Arrival date & time: 08/30/17  0640     History   Chief Complaint Chief Complaint  Patient presents with  . Abdominal Pain    upper abdomen  . Shortness of Breath    HPI Adriana Young is a 57 y.o. female.  57 year old female with prior history of GERD, retention, and hyperlipidemia presents with complaint of epigastric abdominal discomfort.  Patient reports that she has had URI symptoms for last 2 weeks.  She reports significant congestion and cough.  She reports that she has been coughing to the point where she will sometimes vomit.  She reports that over the last 2 days she has noticed some persistent epigastric discomfort in the setting.  She denies associated fever.  She denies nausea at this time.  She denies other abdominal pain.  She denies change in her bowel movements.  She reports a prior history of GERD but does not feel that her symptoms today are the same as what she normally experiences. She has not taken anything at home for her symptoms.   She denies chest pain or shortness of breath.   The history is provided by the patient.  Abdominal Pain   This is a new problem. The current episode started 2 days ago. The problem occurs hourly. The problem has not changed since onset.The pain is associated with an unknown factor. The pain is located in the epigastric region. The quality of the pain is aching. The patient is experiencing no pain. Pertinent negatives include fever. Nothing aggravates the symptoms. Nothing relieves the symptoms.  Shortness of Breath  Associated symptoms include abdominal pain. Pertinent negatives include no fever.    Past Medical History:  Diagnosis Date  . Allergy   . GERD (gastroesophageal reflux disease)   . Glaucoma   . Headache(784.0)   . Hyperlipidemia   . Hypertension   . Osteoarthritis   . Thyroid disease    hypo  . Type II or unspecified type diabetes  mellitus without mention of complication, uncontrolled 09/19/2010    Patient Active Problem List   Diagnosis Date Noted  . Low back pain at multiple sites 10/14/2015  . Degenerative joint disease (DJD) of hip 10/14/2015  . Routine general medical examination at a health care facility 11/01/2014  . Fatty liver 09/19/2010  . Hypothyroidism 06/18/2008  . Hyperlipidemia with target LDL less than 100 06/04/2008  . Essential hypertension 06/04/2008  . GERD 06/04/2008  . Generalized OA 06/04/2008    Past Surgical History:  Procedure Laterality Date  . BREAST SURGERY     Lumpectomy- left breast, no lymph node involvement     OB History   None      Home Medications    Prior to Admission medications   Medication Sig Start Date End Date Taking? Authorizing Provider  amLODipine (NORVASC) 5 MG tablet Take 1 tablet (5 mg total) by mouth daily. 10/09/16  Yes Janith Lima, MD  atorvastatin (LIPITOR) 80 MG tablet Take 1 tablet (80 mg total) by mouth daily at 6 PM. 04/26/17  Yes Janith Lima, MD  levothyroxine (SYNTHROID, LEVOTHROID) 125 MCG tablet TAKE 1 TABLET (125 MCG TOTAL) BY MOUTH DAILY. 01/25/17  Yes Janith Lima, MD  oxybutynin (DITROPAN) 5 MG tablet Take 5 mg by mouth 2 (two) times daily. 06/22/17  Yes [provider]  telmisartan-hydrochlorothiazide (MICARDIS HCT) 80-12.5 MG tablet TAKE 1 TABLET BY MOUTH DAILY. 05/17/17  Yes Scarlette Calico  L, MD  traMADol (ULTRAM) 50 MG tablet TAKE 1 TABLET BY MOUTH EVERY 6 HOURS AS NEEDED 08/22/17  Yes Janith Lima, MD    Family History Family History  Problem Relation Age of Onset  . Heart disease Mother   . Diabetes Mother   . Alcohol abuse Other   . Arthritis Other   . Diabetes Other   . Hyperlipidemia Other   . Hypertension Other   . Stroke Other        Female 1st degree relative < 60  . Colon cancer Neg Hx   . Esophageal cancer Neg Hx   . Stomach cancer Neg Hx   . Rectal cancer Neg Hx     Social History Social  History   Tobacco Use  . Smoking status: Former Smoker    Last attempt to quit: 05/25/2000    Years since quitting: 17.2  . Smokeless tobacco: Never Used  . Tobacco comment: Regukar exercise-Yes  Substance Use Topics  . Alcohol use: No  . Drug use: No     Allergies   Niacin and related   Review of Systems Review of Systems  Constitutional: Negative for fever.  Respiratory: Positive for shortness of breath.   Gastrointestinal: Positive for abdominal pain.  All other systems reviewed and are negative.    Physical Exam Updated Vital Signs BP (!) 153/90 (BP Location: Left Arm)   Pulse 77   Temp 97.9 F (36.6 C) (Oral)   Resp 18   Ht 5\' 8"  (1.727 m)   Wt 88.5 kg (195 lb)   LMP 10/25/2014   SpO2 100%   BMI 29.65 kg/m   Physical Exam  Constitutional: She is oriented to person, place, and time. She appears well-developed and well-nourished. No distress.  HENT:  Head: Normocephalic and atraumatic.  Mouth/Throat: Oropharynx is clear and moist.  Eyes: Pupils are equal, round, and reactive to light. Conjunctivae and EOM are normal.  Neck: Normal range of motion. Neck supple.  Cardiovascular: Normal rate, regular rhythm and normal heart sounds.  Pulmonary/Chest: Effort normal and breath sounds normal. No respiratory distress.  Abdominal: Soft. She exhibits no distension. There is tenderness in the epigastric area.  Musculoskeletal: Normal range of motion. She exhibits no edema or deformity.  Neurological: She is alert and oriented to person, place, and time.  Skin: Skin is warm and dry.  Psychiatric: She has a normal mood and affect.  Nursing note and vitals reviewed.    ED Treatments / Results  Labs (all labs ordered are listed, but only abnormal results are displayed) Labs Reviewed  COMPREHENSIVE METABOLIC PANEL - Abnormal; Notable for the following components:      Result Value   Glucose, Bld 108 (*)    All other components within normal limits  CBC - Abnormal;  Notable for the following components:   Hemoglobin 16.2 (*)    HCT 47.2 (*)    All other components within normal limits  URINALYSIS, ROUTINE W REFLEX MICROSCOPIC - Abnormal; Notable for the following components:   Hgb urine dipstick MODERATE (*)    Bacteria, UA RARE (*)    Squamous Epithelial / LPF 0-5 (*)    All other components within normal limits  LIPASE, BLOOD    EKG EKG Interpretation  Date/Time:  Monday August 30 2017 07:35:18 EDT Ventricular Rate:  68 PR Interval:    QRS Duration: 98 QT Interval:  407 QTC Calculation: 433 R Axis:   82 Text Interpretation:  Sinus rhythm Consider left  atrial enlargement Otherwise within normal limits Confirmed by Orpah Greek [phone]) on 08/30/2017 7:40:44 AM Also confirmed by Orpah Greek [phone]), editor Philomena Doheny [phone])  on 08/30/2017 8:17:53 AM   Radiology Dg Chest 2 View  Result Date: 08/30/2017 CLINICAL DATA:  Mid chest pain and shortness of breath for the past 3 days. History of hypertension, former smoker. Also diabetes. EXAM: CHEST - 2 VIEW COMPARISON:  Report of a chest x-ray of March 18, 1999 and scout radiograph of an abdominal and pelvic CT scan of November 19, 2015. FINDINGS: The lungs are adequately inflated and clear. There is chronic soft tissue fullness in the medial cardio phrenic angle on the right consistent with an epicardial fat pad. There is no pneumothorax, alveolar infiltrate or pleural effusion. The heart and pulmonary vascularity are normal. The mediastinum is normal in width. The bony thorax is unremarkable. IMPRESSION: There is no active cardiopulmonary disease. Electronically Signed   By: David  Martinique M.D.   On: 08/30/2017 08:17    Procedures Procedures (including critical care time)  Medications Ordered in ED Medications  ketorolac (TORADOL) 15 MG/ML injection 15 mg (15 mg Intravenous Given 08/30/17 1035)     Initial Impression / Assessment and Plan / ED Course  I have reviewed the triage  vital signs and the nursing notes.  Pertinent labs & imaging results that were available during my care of the patient were reviewed by me and considered in my medical decision making (see chart for details).     MDM  Screen complete  Patient today is consistent with likely acute gastritis.  Screening labs are without significant findings.  EKG is without evidence of acute ischemia.  Chest x-ray is within normal limits.  She feels improved following her ED evaluation.  She is aware of the need for close follow-up.  She will trial Pepcid at home for control of her symptoms.  Strict return precautions are given and understood.  Final Clinical Impressions(s) / ED Diagnoses   Final diagnoses:  Epigastric abdominal pain    ED Discharge Orders        Ordered    famotidine (PEPCID) 20 MG tablet  2 times daily     08/30/17 1213       Valarie Merino, MD 08/30/17 1215

## 2017-09-13 ENCOUNTER — Ambulatory Visit (HOSPITAL_COMMUNITY)
Admission: EM | Admit: 2017-09-13 | Discharge: 2017-09-13 | Disposition: A | Discharge status: Home / Self Care | Payer: Managed Care, Other (non HMO) | Attending: Family Medicine | Admitting: Family Medicine

## 2017-09-13 ENCOUNTER — Encounter (HOSPITAL_COMMUNITY): Payer: Self-pay | Admitting: Family Medicine

## 2017-09-13 DIAGNOSIS — K529 Noninfective gastroenteritis and colitis, unspecified: Secondary | ICD-10-CM | POA: Diagnosis not present

## 2017-09-13 MED ORDER — OMEPRAZOLE 20 MG PO CPDR: 20.0000 mg | DELAYED_RELEASE_CAPSULE | Freq: Two times a day (BID) | ORAL | 0 refills | Status: DC

## 2017-09-13 MED ORDER — METRONIDAZOLE 500 MG PO TABS: 500.0000 mg | ORAL_TABLET | Freq: Two times a day (BID) | ORAL | 0 refills | Status: DC

## 2017-09-13 NOTE — ED Provider Notes (Signed)
[facility]   [id number] 09/13/17 Arrival Time: 8185   SUBJECTIVE:  Adriana Young is a 56 y.o. female who presents to the urgent care with complaint of bloating, belching, and loss of appetite for 2 weeks after spending a couple days at the beach.  Lately she is noticed some dark stools although her appetite is been so bad that she is hardly eating.  Patient works at the garden center at [employer]     Past Medical History:  Diagnosis Date  . Allergy   . GERD (gastroesophageal reflux disease)   . Glaucoma   . Headache(784.0)   . Hyperlipidemia   . Hypertension   . Osteoarthritis   . Thyroid disease    hypo  . Type II or unspecified type diabetes mellitus without mention of complication, uncontrolled 09/19/2010   Family History  Problem Relation Age of Onset  . Heart disease Mother   . Diabetes Mother   . Alcohol abuse Other   . Arthritis Other   . Diabetes Other   . Hyperlipidemia Other   . Hypertension Other   . Stroke Other        Female 1st degree relative < 60  . Colon cancer Neg Hx   . Esophageal cancer Neg Hx   . Stomach cancer Neg Hx   . Rectal cancer Neg Hx    Social History   Socioeconomic History  . Marital status: Married    Spouse name: Not on file  . Number of children: Not on file  . Years of education: Not on file  . Highest education level: Not on file  Occupational History  . Not on file  Social Needs  . Financial resource strain: Not on file  . Food insecurity:    Worry: Not on file    Inability: Not on file  . Transportation needs:    Medical: Not on file    Non-medical: Not on file  Tobacco Use  . Smoking status: Former Smoker    Last attempt to quit: 05/25/2000    Years since quitting: 17.3  . Smokeless tobacco: Never Used  . Tobacco comment: Regukar exercise-Yes  Substance and Sexual Activity  . Alcohol use: No  . Drug use: No  . Sexual activity: Yes    Birth control/protection: Pill  Lifestyle  . Physical  activity:    Days per week: Not on file    Minutes per session: Not on file  . Stress: Not on file  Relationships  . Social connections:    Talks on phone: Not on file    Gets together: Not on file    Attends religious service: Not on file    Active member of club or organization: Not on file    Attends meetings of clubs or organizations: Not on file    Relationship status: Not on file  . Intimate partner violence:    Fear of current or ex partner: Not on file    Emotionally abused: Not on file    Physically abused: Not on file    Forced sexual activity: Not on file  Other Topics Concern  . Not on file  Social History Narrative  . Not on file   No outpatient medications have been marked as taking for the 09/13/17 encounter [facility] Encounter).   Allergies  Allergen Reactions  . Niacin And Related       ROS: As per HPI, remainder of ROS negative.   OBJECTIVE:   Vitals:   09/13/17  1017  BP: (!) 154/98  Pulse: 89  Resp: 16  Temp: 98.1 F (36.7 C)  TempSrc: Oral  SpO2: 100%     General appearance: alert; no distress Eyes: PERRL; EOMI; conjunctiva normal HENT: normocephalic; atraumatic; nasal mucosa normal; oral mucosa normal Neck: supple Lungs: clear to auscultation bilaterally Heart: regular rate and rhythm Abdomen: soft, moderate diffuse tenderness; hyperactive bowel sounds; no masses or organomegaly; no guarding or rebound tenderness Back: no CVA tenderness Extremities: no cyanosis or edema; symmetrical with no gross deformities Skin: warm and dry Neurologic: normal gait; grossly normal Psychological: alert and cooperative; normal mood and affect      Labs:  Results for orders placed or performed during the hospital encounter of 08/30/17  Lipase, blood  Result Value Ref Range   Lipase 31 11 - 51 U/L  Comprehensive metabolic panel  Result Value Ref Range   Sodium 139 135 - 145 mmol/L   Potassium 3.9 3.5 - 5.1 mmol/L   Chloride 105 101 - 111  mmol/L   CO2 22 22 - 32 mmol/L   Glucose, Bld 108 (H) 65 - 99 mg/dL   BUN 16 6 - 20 mg/dL   Creatinine, Ser 0.94 0.44 - 1.00 mg/dL   Calcium 9.6 8.9 - 10.3 mg/dL   Total Protein 7.4 6.5 - 8.1 g/dL   Albumin 4.3 3.5 - 5.0 g/dL   AST 23 15 - 41 U/L   ALT 24 14 - 54 U/L   Alkaline Phosphatase 67 38 - 126 U/L   Total Bilirubin 0.6 0.3 - 1.2 mg/dL   GFR calc non Af Amer >60 >60 mL/min   GFR calc Af Amer >60 >60 mL/min   Anion gap 12 5 - 15  CBC  Result Value Ref Range   WBC 9.5 4.0 - 10.5 K/uL   RBC 5.11 3.87 - 5.11 MIL/uL   Hemoglobin 16.2 (H) 12.0 - 15.0 g/dL   HCT 47.2 (H) 36.0 - 46.0 %   MCV 92.4 78.0 - 100.0 fL   MCH 31.7 26.0 - 34.0 pg   MCHC 34.3 30.0 - 36.0 g/dL   RDW 12.3 11.5 - 15.5 %   Platelets 214 150 - 400 K/uL  Urinalysis, Routine w reflex microscopic  Result Value Ref Range   Color, Urine YELLOW YELLOW   APPearance CLEAR CLEAR   Specific Gravity, Urine 1.018 1.005 - 1.030   pH 7.0 5.0 - 8.0   Glucose, UA NEGATIVE NEGATIVE mg/dL   Hgb urine dipstick MODERATE (A) NEGATIVE   Bilirubin Urine NEGATIVE NEGATIVE   Ketones, ur NEGATIVE NEGATIVE mg/dL   Protein, ur NEGATIVE NEGATIVE mg/dL   Nitrite NEGATIVE NEGATIVE   Leukocytes, UA NEGATIVE NEGATIVE   RBC / HPF 6-30 0 - 5 RBC/hpf   WBC, UA 0-5 0 - 5 WBC/hpf   Bacteria, UA RARE (A) NONE SEEN   Squamous Epithelial / LPF 0-5 (A) NONE SEEN   Mucus PRESENT     Labs Reviewed - No data to display  No results found.     ASSESSMENT & PLAN:  1. Gastroenteritis     Meds ordered this encounter  Medications  . omeprazole (PRILOSEC) 20 MG capsule    Sig: Take 1 capsule (20 mg total) by mouth 2 (two) times daily before a meal.    Dispense:  14 capsule    Refill:  0  . metroNIDAZOLE (FLAGYL) 500 MG tablet    Sig: Take 1 tablet (500 mg total) by mouth 2 (two) times daily.  Dispense:  14 tablet    Refill:  0    Reviewed expectations re: course of current medical issues. Questions answered. Outlined signs and  symptoms indicating need for more acute intervention. Patient verbalized understanding. After Visit Summary given.    Procedures:      Robyn Haber, MD 09/13/17 [phone]

## 2017-09-13 NOTE — ED Triage Notes (Signed)
Pt here for upper abd pain with N,V,D. She is scheduled to see her GI May 29th.

## 2017-10-12 ENCOUNTER — Ambulatory Visit: Payer: Managed Care, Other (non HMO) | Admitting: Internal Medicine

## 2017-10-20 ENCOUNTER — Encounter: Payer: Self-pay | Admitting: Gastroenterology

## 2017-10-20 ENCOUNTER — Ambulatory Visit (INDEPENDENT_AMBULATORY_CARE_PROVIDER_SITE_OTHER): Payer: Managed Care, Other (non HMO) | Admitting: Gastroenterology

## 2017-10-20 VITALS — BP 120/80 | HR 88 | Ht 69.0 in | Wt 199.4 lb

## 2017-10-20 DIAGNOSIS — K219 Gastro-esophageal reflux disease without esophagitis: Secondary | ICD-10-CM | POA: Diagnosis not present

## 2017-10-20 NOTE — Progress Notes (Signed)
HPI: This is a  very pleasant 57 year old woman who was referred to me by Janith Lima, MD  to evaluate substernal chest pain.    Chief complaint is substernal chest pain, atypical  At the beach last month, sharp pain retrosternal lasted for hours.  Pain did not radiate. + nausea and mucous vomiting.  Symptoms improved 1-2 days.  Repeat symptoms 2 weeks later, went to ER (see below) her symptoms improved 1-2 days.  I did a colonoscopy for her April 2013 for routine risk colon cancer screening.  It was normal and I recommended she repeat colon cancer screening 10 years later.  She went to the ER April 2019 for "bloating and belching" lab tests were done.  It looks like she was sent home on proton pump inhibitor and Flagyl  Still taking omep once daily. Overall weight down recently, started Keto diet (1.5 pounds).   Takes tylenol BId, NSAID once every 2-3 weeks.  Sometimes dysphagai like symptoms, or early satiety she points to her; epigastrium and says food feels up here    Old Data Reviewed: CT scan abdomen pelvis with IV contrast June 2017 was normal except for a "tiny 2 mm nonobstructing left renal stone" Blood work April 2019: Hemoglobin 16, UA + for blood, Hb, lipase normal, cmet normal.   Review of systems: Pertinent positive and negative review of systems were noted in the above HPI section. All other review negative.   Past Medical History:  Diagnosis Date  . Allergy   . GERD (gastroesophageal reflux disease)   . Glaucoma   . Headache(784.0)   . Hyperlipidemia   . Hypertension   . Osteoarthritis   . Thyroid disease    hypo  . Type II or unspecified type diabetes mellitus without mention of complication, uncontrolled 09/19/2010    Past Surgical History:  Procedure Laterality Date  . BREAST SURGERY     Lumpectomy- left breast, no lymph node involvement    Current Outpatient Medications  Medication Sig Dispense Refill  . acetaminophen (TYLENOL 8 HOUR ARTHRITIS  PAIN) 650 MG CR tablet Take 650 mg by mouth 2 (two) times daily.    Marland Kitchen atorvastatin (LIPITOR) 80 MG tablet Take 1 tablet (80 mg total) by mouth daily at 6 PM. 90 tablet 1  . diphenhydrAMINE (BENADRYL) 25 MG tablet Take 25 mg by mouth every 6 (six) hours as needed.    . fexofenadine (ALLEGRA) 180 MG tablet Take 180 mg by mouth daily as needed for allergies or rhinitis.    Marland Kitchen ibuprofen (ADVIL,MOTRIN) 200 MG tablet Take 200 mg by mouth every 6 (six) hours as needed.    Marland Kitchen levothyroxine (SYNTHROID, LEVOTHROID) 125 MCG tablet TAKE 1 TABLET (125 MCG TOTAL) BY MOUTH DAILY. 90 tablet 0  . Multiple Vitamins-Minerals ([employer] SPECTRAVITE PO) Take 1 tablet by mouth daily.    . NON FORMULARY Slimfast Keto: take 1 teaspoon daily.    Marland Kitchen omeprazole (PRILOSEC) 20 MG capsule Take 1 capsule (20 mg total) by mouth 2 (two) times daily before a meal. 14 capsule 0  . oxybutynin (DITROPAN) 5 MG tablet Take 5 mg by mouth 2 (two) times daily.  12  . Probiotic Product (PROBIOTIC DAILY PO) Take 1 capsule by mouth daily.    . traMADol (ULTRAM) 50 MG tablet TAKE 1 TABLET BY MOUTH EVERY 6 HOURS AS NEEDED 65 tablet 1   No current facility-administered medications for this visit.     Allergies as of 10/20/2017 - Review Complete 10/20/2017  Allergen  Reaction Noted  . Bee venom  09/22/2017  . Niacin and related  08/30/2017  . Poison ivy extract  09/22/2017    Family History  Problem Relation Age of Onset  . Heart disease Mother   . Diabetes Mother   . Hypertension Mother   . Alcohol abuse Other   . Arthritis Other   . Hyperlipidemia Other   . Hypertension Other   . Stroke Other        Female 1st degree relative < 60  . Diabetes Maternal Grandmother   . Colon cancer Neg Hx   . Esophageal cancer Neg Hx   . Stomach cancer Neg Hx   . Rectal cancer Neg Hx     Social History   Socioeconomic History  . Marital status: Married    Spouse name: Not on file  . Number of children: Not on file  . Years of education: Not on  file  . Highest education level: Not on file  Occupational History  . Not on file  Social Needs  . Financial resource strain: Not on file  . Food insecurity:    Worry: Not on file    Inability: Not on file  . Transportation needs:    Medical: Not on file    Non-medical: Not on file  Tobacco Use  . Smoking status: Former Smoker    Packs/day: 1.00    Years: 25.00    Pack years: 25.00    Last attempt to quit: 05/25/2000    Years since quitting: 17.4  . Smokeless tobacco: Never Used  Substance and Sexual Activity  . Alcohol use: No  . Drug use: No  . Sexual activity: Yes    Birth control/protection: Pill  Lifestyle  . Physical activity:    Days per week: Not on file    Minutes per session: Not on file  . Stress: Not on file  Relationships  . Social connections:    Talks on phone: Not on file    Gets together: Not on file    Attends religious service: Not on file    Active member of club or organization: Not on file    Attends meetings of clubs or organizations: Not on file    Relationship status: Not on file  . Intimate partner violence:    Fear of current or ex partner: Not on file    Emotionally abused: Not on file    Physically abused: Not on file    Forced sexual activity: Not on file  Other Topics Concern  . Not on file  Social History Narrative   Social History      Diet? Just watching- cutting out bread, drinking water      Do you drink/eat things with caffeine? yes      Marital status?        married                            What year were you married? 1980      Do you live in a house, apartment, assisted living, condo, trailer, etc.? house      Is it one or more stories? one      How many persons live in your home? 3      Do you have any pets in your home? (please list) yes 2 dogs, 3 cats, 3 fish      Highest level of education completed? 2 year college  Current or past profession:      Do you exercise?         yes                              Type & how often? gym      Advanced Directives      Do you have a living will? yes      Do you have a DNR form?                                  If not, do you want to discuss one? no      Do you have signed POA/HPOA for forms? yes      Functional Status      Do you have difficulty bathing or dressing yourself? no      Do you have difficulty preparing food or eating? no      Do you have difficulty managing your medications? no      Do you have difficulty managing your finances? no      Do you have difficulty affording your medications? no     Physical Exam: BP 120/80   Pulse 88   Ht 5\' 9"  (1.753 m)   Wt 199 lb 6.4 oz (90.4 kg)   LMP 10/25/2014   BMI 29.45 kg/m  Constitutional: generally well-appearing Psychiatric: alert and oriented x3 Eyes: extraocular movements intact Mouth: oral pharynx moist, no lesions Neck: supple no lymphadenopathy Cardiovascular: heart regular rate and rhythm Lungs: clear to auscultation bilaterally Abdomen: soft, nontender, nondistended, no obvious ascites, no peritoneal signs, normal bowel sounds Extremities: no lower extremity edema bilaterally Skin: no lesions on visible extremities   Assessment and plan: 57 y.o. female with intermittent atypical chest pain  The substernal pains are burning, certainly atypical.  She was in the emergency room room with them once already.  I think these are very likely GERD related especially since they seem to have responded to taking proton pump inhibitor once daily every morning.  I recommended that she stay on the omeprazole once daily and that we proceed with upper endoscopy at her soonest convenience to check for gastritis, H. pylori infection, significant GERD damage.   Please see the "Patient Instructions" section for addition details about the plan.   Owens Loffler, MD [facility] 10/20/2017, 10:18 AM  Cc: Janith Lima, MD

## 2017-10-20 NOTE — Patient Instructions (Addendum)
You will be set up for an upper endoscopy for chest pain, GERD symptoms. Stay on once daily omeprazole before breakfast for now.  Normal BMI (Body Mass Index- based on height and weight) is between 19 and 25. Your BMI today is Body mass index is 29.45 kg/m. Marland Kitchen Please consider follow up  regarding your BMI with your Primary Care Provider.

## 2017-10-22 ENCOUNTER — Ambulatory Visit: Payer: Managed Care, Other (non HMO) | Admitting: Internal Medicine

## 2017-10-26 ENCOUNTER — Ambulatory Visit: Payer: Managed Care, Other (non HMO) | Admitting: Internal Medicine

## 2017-10-31 ENCOUNTER — Other Ambulatory Visit: Payer: Self-pay | Admitting: Internal Medicine

## 2017-11-02 ENCOUNTER — Encounter: Payer: Self-pay | Admitting: Internal Medicine

## 2017-11-02 ENCOUNTER — Ambulatory Visit
Admission: RE | Admit: 2017-11-02 | Discharge: 2017-11-02 | Disposition: A | Discharge status: Home / Self Care | Payer: Managed Care, Other (non HMO) | Source: Ambulatory Visit | Attending: Internal Medicine | Admitting: Internal Medicine

## 2017-11-02 ENCOUNTER — Ambulatory Visit (INDEPENDENT_AMBULATORY_CARE_PROVIDER_SITE_OTHER): Payer: Managed Care, Other (non HMO) | Admitting: Internal Medicine

## 2017-11-02 VITALS — BP 144/88 | HR 58 | Temp 98.1°F | Resp 10 | Ht 69.0 in | Wt 200.0 lb

## 2017-11-02 DIAGNOSIS — R739 Hyperglycemia, unspecified: Secondary | ICD-10-CM | POA: Diagnosis not present

## 2017-11-02 DIAGNOSIS — R202 Paresthesia of skin: Secondary | ICD-10-CM | POA: Diagnosis not present

## 2017-11-02 DIAGNOSIS — K219 Gastro-esophageal reflux disease without esophagitis: Secondary | ICD-10-CM | POA: Diagnosis not present

## 2017-11-02 DIAGNOSIS — R200 Anesthesia of skin: Secondary | ICD-10-CM | POA: Diagnosis not present

## 2017-11-02 DIAGNOSIS — M542 Cervicalgia: Secondary | ICD-10-CM | POA: Diagnosis not present

## 2017-11-02 DIAGNOSIS — I10 Essential (primary) hypertension: Secondary | ICD-10-CM | POA: Diagnosis not present

## 2017-11-02 DIAGNOSIS — G8929 Other chronic pain: Secondary | ICD-10-CM

## 2017-11-02 DIAGNOSIS — M5442 Lumbago with sciatica, left side: Secondary | ICD-10-CM | POA: Diagnosis not present

## 2017-11-02 DIAGNOSIS — E034 Atrophy of thyroid (acquired): Secondary | ICD-10-CM | POA: Diagnosis not present

## 2017-11-02 DIAGNOSIS — Z23 Encounter for immunization: Secondary | ICD-10-CM | POA: Diagnosis not present

## 2017-11-02 DIAGNOSIS — M159 Polyosteoarthritis, unspecified: Secondary | ICD-10-CM | POA: Diagnosis not present

## 2017-11-02 MED ORDER — LOSARTAN POTASSIUM 50 MG PO TABS: 50.0000 mg | ORAL_TABLET | Freq: Every day | ORAL | 3 refills | Status: DC

## 2017-11-02 NOTE — Patient Instructions (Addendum)
Will call with lab and xray results  START LOSARTAN 50MG  DAILY FOR BLOOD PRESSURE  Continue other medications as ordered  Tdap vaccine given today  Follow up in 1 month for fasting CPE.

## 2017-11-02 NOTE — Progress Notes (Signed)
Patient ID: Adriana Young, female   DOB: February 13, 1961, 57 y.o.   MRN: [id number]   Location:  [facility]  Provider: DR Arletha Grippe  Code Status: FULL CODE Goals of Care: No flowsheet data found.   Chief Complaint  Patient presents with  . Establish Care    New Patient, establish care. Patient c/o generalized aches, pain, and numbness in legs after sitting for a while. Patient also with sharp pain off/on on right side of neck (1st episode 04/2017, last episode 2 weeks ago)   . Medication Refill    No refills needed   . Immunizations    Discuss need for TD vaccine   . Medication Management    All medication bottles present at initial appointment     HPI: Patient is a 57 y.o. female seen today as a new pt. She has generalized arthralgias. Her job requires a lot of heavy lifting, pushing, pulling and bending. She likes to garden and hike. Plans to retire in the next 1.5 yrs.  She has abdominal issues and was seen in the ED  April 8th for epigastric pain. W/u neg for cardiac cause. She retutrned to the ED Apr 22nd and was dx with gastroenteritis and rx 7 days of flagyl and omeprazole. She has seen GI Dr Ardis Hughs and dx with GERD. She is scheduled for EGD on Jul 30th. Currently taking OTC omeprazole daily .  She has seasonal alergy sx's and takes allegra prn. 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 She does wear a mask most days.  HTN - previously on micardis hct but has not had Rx refilled due to medication recall.  Hypothyroidism - takes levothyroxine. Last TSH 1.47  Hyperglycemia - last A1c 5.7% in 2014. She has FHx DM (mother and MGM).  Chronic back pain - followed by Ortho Dr Jacelyn Grip. Last epidural injection given in Oct 2018. She takes tramadol  Hx osteoporosis - no recent DXA  She takes prn diazepam for dental procedures.  Past Medical History:  Diagnosis Date  . Allergy   . GERD (gastroesophageal reflux disease)   . Glaucoma   . Headache(784.0)    . Hyperlipidemia   . Hypertension   . Osteoarthritis   . Thyroid disease    hypo  . Type II or unspecified type diabetes mellitus without mention of complication, uncontrolled 09/19/2010    Past Surgical History:  Procedure Laterality Date  . BREAST SURGERY     Lumpectomy- left breast, no lymph node involvement     reports that she quit smoking about 17 years ago. She has a 25.00 pack-year smoking history. She has never used smokeless tobacco. She reports that she does not drink alcohol or use drugs. Social History   Socioeconomic History  . Marital status: Married    Spouse name: Not on file  . Number of children: Not on file  . Years of education: Not on file  . Highest education level: Not on file  Occupational History  . Not on file  Social Needs  . Financial resource strain: Not on file  . Food insecurity:    Worry: Not on file    Inability: Not on file  . Transportation needs:    Medical: Not on file    Non-medical: Not on file  Tobacco Use  . Smoking status: Former Smoker    Packs/day: 1.00    Years: 25.00    Pack years: 25.00    Last attempt  to quit: 05/25/2000    Years since quitting: 17.4  . Smokeless tobacco: Never Used  Substance and Sexual Activity  . Alcohol use: No  . Drug use: No  . Sexual activity: Yes    Birth control/protection: Pill  Lifestyle  . Physical activity:    Days per week: Not on file    Minutes per session: Not on file  . Stress: Not on file  Relationships  . Social connections:    Talks on phone: Not on file    Gets together: Not on file    Attends religious service: Not on file    Active member of club or organization: Not on file    Attends meetings of clubs or organizations: Not on file    Relationship status: Not on file  . Intimate partner violence:    Fear of current or ex partner: Not on file    Emotionally abused: Not on file    Physically abused: Not on file    Forced sexual activity: Not on file  Other Topics  Concern  . Not on file  Social History Narrative   Social History      Diet? Just watching- cutting out bread, drinking water      Do you drink/eat things with caffeine? yes      Marital status?        married                            What year were you married? 1980      Do you live in a house, apartment, assisted living, condo, trailer, etc.? house      Is it one or more stories? one      How many persons live in your home? 3      Do you have any pets in your home? (please list) yes 2 dogs, 3 cats, 3 fish      Highest level of education completed? 2 year college      Current or past profession:      Do you exercise?         yes                             Type & how often? gym      Advanced Directives      Do you have a living will? yes      Do you have a DNR form?                                  If not, do you want to discuss one? no      Do you have signed POA/HPOA for forms? yes      Functional Status      Do you have difficulty bathing or dressing yourself? no      Do you have difficulty preparing food or eating? no      Do you have difficulty managing your medications? no      Do you have difficulty managing your finances? no      Do you have difficulty affording your medications? no    Family History  Problem Relation Age of Onset  . Heart disease Mother   . Diabetes Mother   . Hypertension Mother   . Alcohol abuse Other   .  Arthritis Other   . Hyperlipidemia Other   . Hypertension Other   . Stroke Other        Female 1st degree relative < 60  . Diabetes Maternal Grandmother   . Colon cancer Neg Hx   . Esophageal cancer Neg Hx   . Stomach cancer Neg Hx   . Rectal cancer Neg Hx     Allergies  Allergen Reactions  . Bee Venom   . Niacin And Related   . Poison Ivy Extract     Outpatient Encounter Medications as of 11/02/2017  Medication Sig  . acetaminophen (TYLENOL 8 HOUR ARTHRITIS PAIN) 650 MG CR tablet Take 1,300 mg by mouth 2 (two)  times daily.   Marland Kitchen atorvastatin (LIPITOR) 80 MG tablet Take 1 tablet (80 mg total) by mouth daily at 6 PM.  . diazepam (VALIUM) 5 MG tablet 1 tablet by mouth prior to dental appointment, if surgery not prior to cleanings  . diphenhydrAMINE (BENADRYL) 25 MG tablet Take 25 mg by mouth every 6 (six) hours as needed.  . fexofenadine (ALLEGRA) 180 MG tablet Take 180 mg by mouth daily as needed for allergies or rhinitis.  Marland Kitchen ibuprofen (ADVIL,MOTRIN) 200 MG tablet Take 200 mg by mouth at bedtime as needed.   Marland Kitchen levothyroxine (SYNTHROID, LEVOTHROID) 125 MCG tablet TAKE 1 TABLET (125 MCG TOTAL) BY MOUTH DAILY.  . Multiple Vitamins-Minerals ([employer] SPECTRAVITE PO) Take 1 tablet by mouth daily.  . NON FORMULARY Slimfast Keto: take 1 teaspoon daily.  Marland Kitchen omeprazole (PRILOSEC) 20 MG capsule Take 20 mg by mouth daily before breakfast.  . oxybutynin (DITROPAN) 5 MG tablet Take 5 mg by mouth 2 (two) times daily.  . Probiotic Product (PROBIOTIC DAILY PO) Take 1 capsule by mouth daily.  Marland Kitchen Propylene Glycol (SYSTANE COMPLETE) 0.6 % SOLN Apply 1 drop to eye. Both eyes twice daily  . Soft Lens Products (REFRESH CONTACTS DROPS) SOLN 1 drop by Does not apply route. Into left eye while wearing contact  . traMADol (ULTRAM) 50 MG tablet TAKE 1 TABLET BY MOUTH EVERY 6 HOURS AS NEEDED  . trolamine salicylate (ASPERCREME) 10 % cream Apply 1 application topically as needed for muscle pain.  . [DISCONTINUED] omeprazole (PRILOSEC) 20 MG capsule Take 1 capsule (20 mg total) by mouth 2 (two) times daily before a meal.   No facility-administered encounter medications on file as of 11/02/2017.     Review of Systems:  Review of Systems  Constitutional: Positive for appetite change (loss).  HENT: Positive for tinnitus and trouble swallowing.        Dry mouth  Eyes: Positive for visual disturbance (wears corrective lenses).       Dry eyes; wears a small contact lens in OS  Respiratory: Positive for cough (with sputum pdtn).     Gastrointestinal: Positive for abdominal pain.  Musculoskeletal: Positive for arthralgias, back pain and myalgias.       Reports hx osteoporosis  Allergic/Immunologic: Positive for environmental allergies.  Neurological: Positive for headaches.  All other systems reviewed and are negative.   Health Maintenance  Topic Date Due  . MAMMOGRAM  03/04/2017  . TETANUS/TDAP  09/06/2017  . INFLUENZA VACCINE  12/23/2017  . PAP SMEAR  03/04/2018  . COLONOSCOPY  09/17/2021  . Hepatitis C Screening  Completed  . HIV Screening  Completed    Physical Exam: Vitals:   11/02/17 0829  BP: (!) 144/88  Pulse: (!) 58  Resp: 10  Temp: 98.1 F (36.7 C)  TempSrc: Oral  SpO2: 97%  Weight: 200 lb (90.7 kg)  Height: 5\' 9"  (1.753 m)   Body mass index is 29.53 kg/m. Physical Exam  Constitutional: She is oriented to person, place, and time. She appears well-developed and well-nourished.  HENT:  Mouth/Throat: Oropharynx is clear and moist. No oropharyngeal exudate.  MMM; no oral thrush  Eyes: Pupils are equal, round, and reactive to light. No scleral icterus.  Neck: Neck supple. Muscular tenderness present. Carotid bruit is not present. Decreased range of motion present. No tracheal deviation present. No thyromegaly present.    Cardiovascular: Normal rate, regular rhythm, normal heart sounds and intact distal pulses. Exam reveals no gallop and no friction rub.  No murmur heard. No LE edema b/l. no calf TTP.   Pulmonary/Chest: Effort normal and breath sounds normal. No stridor. No respiratory distress. She has no wheezes. She has no rales.  Abdominal: Soft. Normal appearance and bowel sounds are normal. She exhibits no distension and no mass. There is no hepatomegaly. There is tenderness (RLQ). There is no rigidity, no rebound and no guarding. No hernia.  Musculoskeletal: She exhibits edema and tenderness.       Lumbar back: She exhibits decreased range of motion, tenderness and spasm.        Back:  Lymphadenopathy:    She has no cervical adenopathy.  Neurological: She is alert and oriented to person, place, and time. She has normal reflexes.  Skin: Skin is warm and dry. No rash noted.  Psychiatric: She has a normal mood and affect. Her behavior is normal. Judgment and thought content normal.    Labs reviewed: Basic Metabolic Panel: Recent Labs    08/30/17 1034  NA 139  K 3.9  CL 105  CO2 22  GLUCOSE 108*  BUN 16  CREATININE 0.94  CALCIUM 9.6   Liver Function Tests: Recent Labs    08/30/17 1034  AST 23  ALT 24  ALKPHOS 67  BILITOT 0.6  PROT 7.4  ALBUMIN 4.3   Recent Labs    08/30/17 1034  LIPASE 31   No results for input(s): AMMONIA in the last 8760 hours. CBC: Recent Labs    08/30/17 1034  WBC 9.5  HGB 16.2*  HCT 47.2*  MCV 92.4  PLT 214   Lipid Panel: No results for input(s): CHOL, HDL, LDLCALC, TRIG, CHOLHDL, LDLDIRECT in the last 8760 hours. Lab Results  Component Value Date   HGBA1C 5.7 08/11/2012    Procedures since last visit: No results found.  Assessment/Plan   ICD-10-CM   1. Neck pain M54.2 DG Cervical Spine Complete  2. Hyperglycemia R73.9 Hemoglobin A1c  3. Hypothyroidism due to acquired atrophy of thyroid E03.4 TSH    T4, Free  4. Essential hypertension I10 losartan (COZAAR) 50 MG tablet  5. Generalized OA M15.9   6. Gastroesophageal reflux disease without esophagitis K21.9   7. Chronic left-sided low back pain with left-sided sciatica M54.42    G89.29   8. Numbness and tingling of both upper extremities R20.0    R20.2   9. Need for diphtheria-tetanus-pertussis (Tdap) vaccine Z23 Tdap vaccine greater than or equal to 7yo IM    Will call with lab and xray results  START LOSARTAN 50MG  DAILY FOR BLOOD PRESSURE  Continue other medications as ordered  Tdap vaccine given today  Follow up in 1 month for fasting CPE   Greely Atiyeh S. Perlie Gold  [facility] [address],  [place] [address] [phone] Cell (Monday-Friday 8 AM - 5 PM) [phone] After 5 PM and follow prompts

## 2017-11-03 LAB — HEMOGLOBIN A1C
EAG (MMOL/L): 6.2 (calc)
HEMOGLOBIN A1C: 5.5 %{Hb} (ref <5.7)
MEAN PLASMA GLUCOSE: 111 (calc)

## 2017-11-03 LAB — T4, FREE: Free T4: 1.2 ng/dL (ref 0.8–1.8)

## 2017-11-03 LAB — TSH: TSH: 1.26 mIU/L (ref 0.40–4.50)

## 2017-11-07 ENCOUNTER — Other Ambulatory Visit: Payer: Self-pay | Admitting: Internal Medicine

## 2017-12-07 ENCOUNTER — Encounter: Payer: Self-pay | Admitting: Gastroenterology

## 2017-12-21 ENCOUNTER — Encounter: Payer: Self-pay | Admitting: Gastroenterology

## 2017-12-21 ENCOUNTER — Ambulatory Visit (AMBULATORY_SURGERY_CENTER): Payer: Managed Care, Other (non HMO) | Admitting: Gastroenterology

## 2017-12-21 VITALS — BP 130/72 | HR 59 | Temp 97.1°F | Resp 16 | Ht 69.0 in | Wt 199.0 lb

## 2017-12-21 DIAGNOSIS — K219 Gastro-esophageal reflux disease without esophagitis: Secondary | ICD-10-CM | POA: Diagnosis not present

## 2017-12-21 MED ORDER — SODIUM CHLORIDE 0.9 % IV SOLN: 500.0000 mL | INTRAVENOUS | Status: DC

## 2017-12-21 MED ORDER — OMEPRAZOLE 40 MG PO CPDR: DELAYED_RELEASE_CAPSULE | ORAL | 11 refills | Status: DC

## 2017-12-21 NOTE — Op Note (Signed)
[facility] Patient Name: Adriana Young Procedure Date: 12/21/2017 9:43 AM MRN: [id number] Endoscopist: [name] , MD Age: 57 Referring MD:  Date of Birth: [birth year] Gender: Female Account #: [id number] Procedure:                Upper GI endoscopy Indications:              Heartburn Medicines:                Monitored Anesthesia Care Procedure:                Pre-Anesthesia Assessment:                           - Prior to the procedure, a History and Physical                            was performed, and patient medications and                            allergies were reviewed. The patient's tolerance of                            previous anesthesia was also reviewed. The risks                            and benefits of the procedure and the sedation                            options and risks were discussed with the patient.                            All questions were answered, and informed consent                            was obtained. Prior Anticoagulants: The patient has                            taken no previous anticoagulant or antiplatelet                            agents. ASA Grade Assessment: II - A patient with                            mild systemic disease. After reviewing the risks                            and benefits, the patient was deemed in                            satisfactory condition to undergo the procedure.                           After obtaining informed consent, the endoscope was  passed under direct vision. Throughout the                            procedure, the patient's blood pressure, pulse, and                            oxygen saturations were monitored continuously. The                            Endoscope was introduced through the mouth, and                            advanced to the second part of duodenum. The upper                            GI endoscopy was accomplished without  difficulty.                            The patient tolerated the procedure well. Scope In: Scope Out: Findings:                 The esophagus was normal.                           The stomach was normal.                           The examined duodenum was normal. Complications:            No immediate complications. Estimated blood loss:                            None. Estimated Blood Loss:     Estimated blood loss: none. Impression:               - GERD without overt acid damage to the esophagus                            (non-erosive GERD) Recommendation:           - Patient has a contact number available for                            emergencies. The signs and symptoms of potential                            delayed complications were discussed with the                            patient. Return to normal activities tomorrow.                            Written discharge instructions were provided to the                            patient.                           -  Resume previous diet.                           - New prescription written today: omeprazole 40mg                             pills, one pill 20-30 min before breakfast meal                            daily, disp 30 with 11 refills. You may not need                            the nightly acid reducer while taking this higher                            strength omeprazole. [name], MD 12/21/2017 9:55:22 AM This report has been signed electronically.

## 2017-12-21 NOTE — Progress Notes (Signed)
Report given to PACU, vss 

## 2017-12-21 NOTE — Patient Instructions (Signed)
YOU HAD AN ENDOSCOPIC PROCEDURE TODAY AT THE [facility]:   Refer to the procedure report that was given to you for any specific questions about what was found during the examination.  If the procedure report does not answer your questions, please call your gastroenterologist to clarify.  If you requested that your care partner not be given the details of your procedure findings, then the procedure report has been included in a sealed envelope for you to review at your convenience later.  YOU SHOULD EXPECT: Some feelings of bloating in the abdomen. Passage of more gas than usual.  Walking can help get rid of the air that was put into your GI tract during the procedure and reduce the bloating.   Please Note:  You might notice some irritation and congestion in your nose or some drainage.  This is from the oxygen used during your procedure.  There is no need for concern and it should clear up in a day or so.  SYMPTOMS TO REPORT IMMEDIATELY:    Following upper endoscopy (EGD)  Vomiting of blood or coffee ground material  New chest pain or pain under the shoulder blades  Painful or persistently difficult swallowing  New shortness of breath  Fever of 100F or higher  Black, tarry-looking stools  For urgent or emergent issues, a gastroenterologist can be reached at any hour by calling [phone].   DIET:  We do recommend a small meal at first, but then you may proceed to your regular diet.  Drink plenty of fluids but you should avoid alcoholic beverages for 24 hours.  ACTIVITY:  You should plan to take it easy for the rest of today and you should NOT DRIVE or use heavy machinery until tomorrow (because of the sedation medicines used during the test).    FOLLOW UP: Our staff will call the number listed on your records the next business day following your procedure to check on you and address any questions or concerns that you may have regarding the information given to you  following your procedure. If we do not reach you, we will leave a message.  However, if you are feeling well and you are not experiencing any problems, there is no need to return our call.  We will assume that you have returned to your regular daily activities without incident.  If any biopsies were taken you will be contacted by phone or by letter within the next 1-3 weeks.  Please call us at [phone] if you have not heard about the biopsies in 3 weeks.    SIGNATURES/CONFIDENTIALITY: You and/or your care partner have signed paperwork which will be entered into your electronic medical record.  These signatures attest to the fact that that the information above on your After Visit Summary has been reviewed and is understood.  Full responsibility of the confidentiality of this discharge information lies with you and/or your care-partner.  Read all handouts given to you by your recovery room nurse. 

## 2017-12-21 NOTE — Progress Notes (Signed)
Pt's states no medical or surgical changes since previsit or office visit. 

## 2017-12-22 ENCOUNTER — Telehealth: Payer: Self-pay

## 2017-12-22 NOTE — Telephone Encounter (Signed)
  Follow up Call-  Call back number 12/21/2017  Post procedure Call Back phone  # [phone]  Permission to leave phone message Yes  Some recent data might be hidden     Patient questions:  Do you have a fever, pain , or abdominal swelling? No. Pain Score  0 *  Have you tolerated food without any problems? Yes.    Have you been able to return to your normal activities? Yes.    Do you have any questions about your discharge instructions: Diet   No. Medications  No. Follow up visit  No.  Do you have questions or concerns about your Care? No.  Actions: * If pain score is 4 or above: No action needed, pain <4.

## 2017-12-31 ENCOUNTER — Encounter: Payer: Self-pay | Admitting: Internal Medicine

## 2017-12-31 ENCOUNTER — Ambulatory Visit (INDEPENDENT_AMBULATORY_CARE_PROVIDER_SITE_OTHER): Payer: Managed Care, Other (non HMO) | Admitting: Internal Medicine

## 2017-12-31 VITALS — BP 148/86 | HR 67 | Temp 97.9°F | Ht 69.0 in | Wt 198.0 lb

## 2017-12-31 DIAGNOSIS — M159 Polyosteoarthritis, unspecified: Secondary | ICD-10-CM

## 2017-12-31 DIAGNOSIS — K219 Gastro-esophageal reflux disease without esophagitis: Secondary | ICD-10-CM

## 2017-12-31 DIAGNOSIS — E034 Atrophy of thyroid (acquired): Secondary | ICD-10-CM

## 2017-12-31 DIAGNOSIS — E782 Mixed hyperlipidemia: Secondary | ICD-10-CM

## 2017-12-31 DIAGNOSIS — I10 Essential (primary) hypertension: Secondary | ICD-10-CM | POA: Diagnosis not present

## 2017-12-31 DIAGNOSIS — Z0000 Encounter for general adult medical examination without abnormal findings: Secondary | ICD-10-CM | POA: Diagnosis not present

## 2017-12-31 DIAGNOSIS — G8929 Other chronic pain: Secondary | ICD-10-CM

## 2017-12-31 DIAGNOSIS — M5442 Lumbago with sciatica, left side: Secondary | ICD-10-CM

## 2017-12-31 LAB — BASIC METABOLIC PANEL WITH GFR
BUN: 20 mg/dL (ref 7–25)
CALCIUM: 9.9 mg/dL (ref 8.6–10.4)
CO2: 28 mmol/L (ref 20–32)
Chloride: 105 mmol/L (ref 98–110)
Creat: 1.01 mg/dL (ref 0.50–1.05)
GFR, EST AFRICAN AMERICAN: 72 mL/min/{1.73_m2} (ref >=60)
GFR, EST NON AFRICAN AMERICAN: 62 mL/min/{1.73_m2} (ref >=60)
Glucose, Bld: 92 mg/dL (ref 65–99)
Potassium: 4.4 mmol/L (ref 3.5–5.3)
Sodium: 142 mmol/L (ref 135–146)

## 2017-12-31 LAB — LIPID PANEL
CHOL/HDL RATIO: 3 (calc) (ref <5.0)
Cholesterol: 162 mg/dL (ref <200)
HDL: 54 mg/dL (ref >50)
LDL CHOLESTEROL (CALC): 87 mg/dL
NON-HDL CHOLESTEROL (CALC): 108 mg/dL (ref <130)
TRIGLYCERIDES: 109 mg/dL (ref <150)

## 2017-12-31 LAB — ALT: ALT: 25 U/L (ref 6–29)

## 2017-12-31 MED ORDER — ATORVASTATIN CALCIUM 80 MG PO TABS: 80.0000 mg | ORAL_TABLET | Freq: Every day | ORAL | 1 refills | Status: DC

## 2017-12-31 NOTE — Patient Instructions (Addendum)
Continue current medications as ordered  Follow up with specialists as scheduled  Follow up in 6 mos with Janett Billow for thyroid, hyperlipidemia, , GERD. Fasting labs prior to appt  Keeping You Healthy  Get These Tests  Blood Pressure- Have your blood pressure checked by your healthcare provider at least once a year.  Normal blood pressure is 120/80.  Weight- Have your body mass index (BMI) calculated to screen for obesity.  BMI is a measure of body fat based on height and weight.  You can calculate your own BMI at [employer].it  Cholesterol- Have your cholesterol checked every year.  Diabetes- Have your blood sugar checked every year if you have high blood pressure, high cholesterol, a family history of diabetes or if you are overweight.  Pap Test - Have a pap test every 1 to 5 years if you have been sexually active.  If you are older than 65 and recent pap tests have been normal you may not need additional pap tests.  In addition, if you have had a hysterectomy  for benign disease additional pap tests are not necessary.  Mammogram-Yearly mammograms are essential for early detection of breast cancer  Screening for Colon Cancer- Colonoscopy starting at age 27. Screening may begin sooner depending on your family history and other health conditions.  Follow up colonoscopy as directed by your Gastroenterologist.  Screening for Osteoporosis- Screening begins at age 96 with bone density scanning, sooner if you are at higher risk for developing Osteoporosis.  Get these medicines  Calcium with Vitamin D- Your body requires 1200-1500 mg of Calcium a day and [phone] IU of Vitamin D a day.  You can only absorb 500 mg of Calcium at a time therefore Calcium must be taken in 2 or 3 separate doses throughout the day.  Hormones- Hormone therapy has been associated with increased risk for certain cancers and heart disease.  Talk to your healthcare provider about if you need relief from  menopausal symptoms.  Aspirin- Ask your healthcare provider about taking Aspirin to prevent Heart Disease and Stroke.  Get these Immuniztions  Flu shot- Every fall  Pneumonia shot- Once after the age of 43; if you are younger ask your healthcare provider if you need a pneumonia shot.  Tetanus- Every ten years.  Zostavax- Once after the age of 84 to prevent shingles.  Take these steps  Don't smoke- Your healthcare provider can help you quit. For tips on how to quit, ask your healthcare provider or go to www.smokefree.gov or call 1-800 QUIT-NOW.  Be physically active- Exercise 5 days a week for a minimum of 30 minutes.  If you are not already physically active, start slow and gradually work up to 30 minutes of moderate physical activity.  Try walking, dancing, bike riding, swimming, etc.  Eat a healthy diet- Eat a variety of healthy foods such as fruits, vegetables, whole grains, low fat milk, low fat cheeses, yogurt, lean meats, chicken, fish, eggs, dried beans, tofu, etc.  For more information go to www.thenutritionsource.org  Dental visit- Brush and floss teeth twice daily; visit your dentist twice a year.  Eye exam- Visit your Optometrist or Ophthalmologist yearly.  Drink alcohol in moderation- Limit alcohol intake to one drink or less a day.  Never drink and drive.  Depression- Your emotional health is as important as your physical health.  If you're feeling down or losing interest in things you normally enjoy, please talk to your healthcare provider.  Seat Belts- can save your life;  always wear one  Smoke/Carbon Monoxide detectors- These detectors need to be installed on the appropriate level of your home.  Replace batteries at least once a year.  Violence- If anyone is threatening or hurting you, please tell your healthcare provider.  Living Will/ Health care power of attorney- Discuss with your healthcare provider and family.

## 2017-12-31 NOTE — Progress Notes (Signed)
Patient ID: Adriana Young, female   DOB: 11/20/1960, 57 y.o.   MRN: [id number]   Location:  [facility]  Place of Service:  OFFICE  Provider: Arletha Grippe, DO  Patient Care Team: [name], DO as PCP - General (Internal Medicine) [name], MD as Attending Physician (Gastroenterology)  Extended Emergency Contact Information Primary Emergency Contact: Hope Pigeon Address: [address]          [place] [address] [place] Phone: [phone] Relation: Spouse  Code Status:  Goals of Care: Advanced Directive information No flowsheet data found.   Chief Complaint  Patient presents with  . Medical Management of Chronic Issues    Pt is being seen for a complete physical. Pt sees GYN for pap  . Depression    Score of 0  . Audit C Screening    Score of 0  . Medication Refill    atorvastatin Rx pended    HPI: Patient is a 57 y.o. female seen in today for an annual wellness exam.  She had an episode of hives of unknown etiology on 12/28/17. She reports no change in diet, lotions, parfumes, medications, soaps, laundry detergents. She did notice outside parking lot was being repaved with top coat of tar. She went to UC at [facility] and Rx calamine lotion, hydroxyzine and prednisone. She rec'd steroid shot. No further sx's. She did take benadryl prior to UC visit.  She saw GI and PPI increased to '40mg'$  which helped GERD  Dr Ronnald Ramp rx tramadol for back pain and it is much better. She has not needed it in ;ast 3-4 weeks. Last back injection in July 2019.  GERD - stable on omeprazole '40mg'$  daily; followed by GI Dr Ardis Hughs; EGD completed in Jul 2019  Seasonal allergies - stable on allegra prn. 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 She does wear a mask most days.  HTN - stable on losartan.  Hypothyroidism - stable on levothyroxine. Last TSH 1.26; T4 free 1.2  Hyperglycemia - a1c 5.5%. She has FHx DM (mother and MGM).  Chronic back  pain - followed by Ortho Dr Jacelyn Grip. Last epidural injection given in July 2019. She takes tramadol prn.  Hx osteoporosis - no recent DXA  She takes prn diazepam for dental procedures.  Hyperlipidemia - stable on lipitor '80mg'$  daily  Depression screen [facility] 2/9 12/31/2017 12/16/2016 11/19/2015  Decreased Interest 0 0 0  Down, Depressed, Hopeless 0 0 0  PHQ - 2 Score 0 0 0    Fall Risk  12/31/2017 11/02/2017 12/16/2016 11/19/2015  Falls in the past year? No No No No   No flowsheet data found.   Health Maintenance  Topic Date Due  . INFLUENZA VACCINE  12/23/2017  . MAMMOGRAM  04/22/2018 (Originally 03/04/2017)  . PAP SMEAR  03/04/2018  . COLONOSCOPY  09/17/2021  . TETANUS/TDAP  11/03/2027  . Hepatitis C Screening  Completed  . HIV Screening  Completed    Past Medical History:  Diagnosis Date  . Allergy   . GERD (gastroesophageal reflux disease)   . Glaucoma   . Headache(784.0)   . Hyperlipidemia   . Hypertension   . Osteoarthritis   . Thyroid disease    hypo  . Type II or unspecified type diabetes mellitus without mention of complication, uncontrolled 09/19/2010    Past Surgical History:  Procedure Laterality Date  . BREAST SURGERY     Lumpectomy- left breast, no lymph node  involvement    Family History  Problem Relation Age of Onset  . Heart disease Mother   . Diabetes Mother   . Hypertension Mother   . Alcohol abuse Other   . Arthritis Other   . Hyperlipidemia Other   . Hypertension Other   . Stroke Other        Female 1st degree relative < 60  . Diabetes Maternal Grandmother   . Colon cancer Neg Hx   . Esophageal cancer Neg Hx   . Stomach cancer Neg Hx   . Rectal cancer Neg Hx    Family Status  Relation Name Status  . Mother Wynema Birch  . Son Sheral Flow  . Son Chestine Spore  . Other  (Not Specified)  . MGM  (Not Specified)  . Neg Hx  (Not Specified)    Social History   Socioeconomic History  . Marital status: Married    Spouse name: Not on file  .  Number of children: Not on file  . Years of education: Not on file  . Highest education level: Not on file  Occupational History  . Not on file  Social Needs  . Financial resource strain: Not on file  . Food insecurity:    Worry: Not on file    Inability: Not on file  . Transportation needs:    Medical: Not on file    Non-medical: Not on file  Tobacco Use  . Smoking status: Former Smoker    Packs/day: 1.00    Years: 25.00    Pack years: 25.00    Last attempt to quit: 05/25/2000    Years since quitting: 17.6  . Smokeless tobacco: Never Used  Substance and Sexual Activity  . Alcohol use: No  . Drug use: No  . Sexual activity: Yes    Birth control/protection: Pill  Lifestyle  . Physical activity:    Days per week: Not on file    Minutes per session: Not on file  . Stress: Not on file  Relationships  . Social connections:    Talks on phone: Not on file    Gets together: Not on file    Attends religious service: Not on file    Active member of club or organization: Not on file    Attends meetings of clubs or organizations: Not on file    Relationship status: Not on file  . Intimate partner violence:    Fear of current or ex partner: Not on file    Emotionally abused: Not on file    Physically abused: Not on file    Forced sexual activity: Not on file  Other Topics Concern  . Not on file  Social History Narrative   Social History      Diet? Just watching- cutting out bread, drinking water      Do you drink/eat things with caffeine? yes      Marital status?        married                            What year were you married? 1980      Do you live in a house, apartment, assisted living, condo, trailer, etc.? house      Is it one or more stories? one      How many persons live in your home? 3      Do you have any pets in your home? (please list)  yes 2 dogs, 3 cats, 3 fish      Highest level of education completed? 2 year college      Current or past profession:        Do you exercise?         yes                             Type & how often? gym      Advanced Directives      Do you have a living will? yes      Do you have a DNR form?                                  If not, do you want to discuss one? no      Do you have signed POA/HPOA for forms? yes      Functional Status      Do you have difficulty bathing or dressing yourself? no      Do you have difficulty preparing food or eating? no      Do you have difficulty managing your medications? no      Do you have difficulty managing your finances? no      Do you have difficulty affording your medications? no    Allergies  Allergen Reactions  . Bee Venom   . Niacin And Related   . Poison Ivy Extract     Allergies as of 12/31/2017      Reactions   Bee Venom    Niacin And Related    Poison Ivy Extract       Medication List        Accurate as of 12/31/17  9:55 AM. Always use your most recent med list.          atorvastatin 80 MG tablet Commonly known as:  LIPITOR Take 1 tablet (80 mg total) by mouth daily at 6 PM.   [facility] SPECTRAVITE PO Take 1 tablet by mouth daily.   diazepam 5 MG tablet Commonly known as:  VALIUM 1 tablet by mouth prior to dental appointment, if surgery not prior to cleanings   diphenhydrAMINE 25 MG tablet Commonly known as:  BENADRYL Take 25 mg by mouth every 6 (six) hours as needed.   fexofenadine 180 MG tablet Commonly known as:  ALLEGRA Take 180 mg by mouth daily as needed for allergies or rhinitis.   ibuprofen 200 MG tablet Commonly known as:  ADVIL,MOTRIN Take 200 mg by mouth at bedtime as needed.   levothyroxine 125 MCG tablet Commonly known as:  SYNTHROID, LEVOTHROID TAKE 1 TABLET (125 MCG TOTAL) BY MOUTH DAILY.   losartan 50 MG tablet Commonly known as:  COZAAR Take 1 tablet (50 mg total) by mouth daily.   NON FORMULARY Slimfast Keto: take 1 teaspoon daily.   omeprazole 40 MG capsule Commonly known as:  PRILOSEC Be sure to  take 1/2 hour before meal.   oxybutynin 5 MG tablet Commonly known as:  DITROPAN Take 5 mg by mouth 2 (two) times daily.   prednisoLONE 5 MG (21) Tbpk Take 1 tablet by mouth daily.   PROBIOTIC DAILY PO Take 1 capsule by mouth daily.   REFRESH CONTACTS DROPS Soln 1 drop by Does not apply route. Into left eye while wearing contact   SYSTANE COMPLETE 0.6 % Soln Generic drug:  Propylene Glycol Apply  1 drop to eye. Both eyes twice daily   trolamine salicylate 10 % cream Commonly known as:  ASPERCREME Apply 1 application topically as needed for muscle pain.   TYLENOL 8 HOUR ARTHRITIS PAIN 650 MG CR tablet Generic drug:  acetaminophen Take 1,300 mg by mouth 2 (two) times daily.        Review of Systems:  Review of Systems  Musculoskeletal: Positive for arthralgias and back pain.  All other systems reviewed and are negative.   Physical Exam: Vitals:   12/31/17 0927  BP: (!) 148/86  Pulse: 67  Temp: 97.9 F (36.6 C)  TempSrc: Oral  SpO2: 97%  Weight: 198 lb (89.8 kg)  Height: '5\' 9"'$  (1.753 m)   Body mass index is 29.24 kg/m. Physical Exam  Constitutional: She is oriented to person, place, and time. She appears well-developed and well-nourished. No distress.  HENT:  Head: Normocephalic and atraumatic.  Right Ear: Hearing, tympanic membrane, external ear and ear canal normal.  Left Ear: Hearing, tympanic membrane, external ear and ear canal normal.  Mouth/Throat: Uvula is midline, oropharynx is clear and moist and mucous membranes are normal. She does not have dentures.  Eyes: Pupils are equal, round, and reactive to light. Conjunctivae, EOM and lids are normal. No scleral icterus.  Neck: Trachea normal and normal range of motion. Neck supple. Carotid bruit is not present. No thyroid mass and no thyromegaly present.  Cardiovascular: Normal rate, regular rhythm and intact distal pulses. Exam reveals no gallop and no friction rub.  Murmur (1/6 SEM) heard. No LE edema  b/l. No calf TTP.   Pulmonary/Chest: Effort normal and breath sounds normal. She has no wheezes. She has no rhonchi. She has no rales. She exhibits no mass. Right breast exhibits no inverted nipple, no mass, no nipple discharge, no skin change and no tenderness. Left breast exhibits no inverted nipple, no mass, no nipple discharge, no skin change and no tenderness. Breasts are symmetrical.  Abdominal: Soft. Normal appearance, normal aorta and bowel sounds are normal. She exhibits no pulsatile midline mass and no mass. There is no hepatosplenomegaly. There is no tenderness. There is no rigidity, no rebound and no guarding. No hernia.  Genitourinary:  Genitourinary Comments: Deferred to GYN  Musculoskeletal: Normal range of motion. She exhibits no deformity.  Lymphadenopathy:       Head (right side): No posterior auricular adenopathy present.       Head (left side): No posterior auricular adenopathy present.    She has no cervical adenopathy.       Right: No supraclavicular adenopathy present.       Left: No supraclavicular adenopathy present.  Neurological: She is alert and oriented to person, place, and time. She has normal strength and normal reflexes. No cranial nerve deficit. Gait normal.  Skin: Skin is warm, dry and intact. No rash noted. Nails show no clubbing.  Psychiatric: She has a normal mood and affect. Her speech is normal and behavior is normal. Thought content normal. Cognition and memory are normal.    Labs reviewed:  Basic Metabolic Panel: Recent Labs    08/30/17 1034 11/02/17 0958  NA 139  --   K 3.9  --   CL 105  --   CO2 22  --   GLUCOSE 108*  --   BUN 16  --   CREATININE 0.94  --   CALCIUM 9.6  --   TSH  --  1.26   Liver Function Tests: Recent Labs  08/30/17 1034  AST 23  ALT 24  ALKPHOS 67  BILITOT 0.6  PROT 7.4  ALBUMIN 4.3   Recent Labs    08/30/17 1034  LIPASE 31   No results for input(s): AMMONIA in the last 8760 hours. CBC: Recent Labs     08/30/17 1034  WBC 9.5  HGB 16.2*  HCT 47.2*  MCV 92.4  PLT 214   Lipid Panel: No results for input(s): CHOL, HDL, LDLCALC, TRIG, CHOLHDL, LDLDIRECT in the last 8760 hours. Lab Results  Component Value Date   HGBA1C 5.5 11/02/2017    Procedures: No results found.  Assessment/Plan   ICD-10-CM   1. Well adult exam Z00.00   2. Essential hypertension I10 BMP with eGFR([facility])    ALT  3. Hypothyroidism due to acquired atrophy of thyroid E03.4   4. Gastroesophageal reflux disease without esophagitis K21.9   5. Chronic left-sided low back pain with left-sided sciatica M54.42    G89.29   6. Generalized OA M15.9   7. Mixed hyperlipidemia E78.2 Lipid Panel   Continue current medications as ordered  Follow up with specialists as scheduled  Follow up in 6 mos with Janett Billow for thyroid, hyperlipidemia, , GERD. Fasting labs prior to appt  Keeping You Healthy handout given  Cordella Register. Perlie Gold  [facility] [address] [place], [place] [address] [phone] Cell (Monday-Friday 8 AM - 5 PM) [phone] After 5 PM and follow prompts

## 2018-01-12 ENCOUNTER — Encounter: Payer: Self-pay | Admitting: Internal Medicine

## 2018-01-19 ENCOUNTER — Other Ambulatory Visit: Payer: Self-pay

## 2018-01-19 DIAGNOSIS — I10 Essential (primary) hypertension: Secondary | ICD-10-CM

## 2018-01-19 DIAGNOSIS — E034 Atrophy of thyroid (acquired): Secondary | ICD-10-CM

## 2018-01-19 DIAGNOSIS — E782 Mixed hyperlipidemia: Secondary | ICD-10-CM

## 2018-01-29 ENCOUNTER — Other Ambulatory Visit: Payer: Self-pay | Admitting: Internal Medicine

## 2018-01-29 DIAGNOSIS — I10 Essential (primary) hypertension: Secondary | ICD-10-CM

## 2018-02-04 ENCOUNTER — Other Ambulatory Visit: Payer: Self-pay

## 2018-02-04 MED ORDER — LEVOTHYROXINE SODIUM 125 MCG PO TABS: ORAL_TABLET | ORAL | 1 refills | Status: DC

## 2018-02-04 NOTE — Telephone Encounter (Signed)
Patient called to request a refill of levothyroxine 125 mcg. Rx was sent to pharmacy electronically.

## 2018-04-26 ENCOUNTER — Ambulatory Visit (INDEPENDENT_AMBULATORY_CARE_PROVIDER_SITE_OTHER): Payer: Managed Care, Other (non HMO) | Admitting: Nurse Practitioner

## 2018-04-26 ENCOUNTER — Encounter: Payer: Self-pay | Admitting: Nurse Practitioner

## 2018-04-26 VITALS — BP 142/90 | HR 66 | Temp 98.3°F | Ht 69.0 in | Wt 200.0 lb

## 2018-04-26 DIAGNOSIS — J208 Acute bronchitis due to other specified organisms: Secondary | ICD-10-CM

## 2018-04-26 DIAGNOSIS — K521 Toxic gastroenteritis and colitis: Secondary | ICD-10-CM | POA: Diagnosis not present

## 2018-04-26 LAB — CBC WITH DIFFERENTIAL/PLATELET
Basophils Absolute: 25 cells/uL (ref 0–200)
Basophils Relative: 0.2 %
EOS PCT: 0.2 %
Eosinophils Absolute: 25 cells/uL (ref 15–500)
HCT: 48.5 % — ABNORMAL HIGH (ref 35.0–45.0)
Hemoglobin: 16.7 g/dL — ABNORMAL HIGH (ref 11.7–15.5)
Lymphs Abs: 2042 cells/uL (ref 850–3900)
MCH: 31.7 pg (ref 27.0–33.0)
MCHC: 34.4 g/dL (ref 32.0–36.0)
MCV: 92.2 fL (ref 80.0–100.0)
MONOS PCT: 7 %
MPV: 10.3 fL (ref 7.5–12.5)
Neutro Abs: 9348 cells/uL — ABNORMAL HIGH (ref 1500–7800)
Neutrophils Relative %: 76 %
PLATELETS: 304 10*3/uL (ref 140–400)
RBC: 5.26 10*6/uL — AB (ref 3.80–5.10)
RDW: 12.6 % (ref 11.0–15.0)
TOTAL LYMPHOCYTE: 16.6 %
WBC mixed population: 861 cells/uL (ref 200–950)
WBC: 12.3 10*3/uL — AB (ref 3.8–10.8)

## 2018-04-26 NOTE — Progress Notes (Signed)
Careteam: Patient Care Team: Adriana Cranker, DO as PCP - General (Internal Medicine) [name], MD as Attending Physician (Gastroenterology)  Advanced Directive information    Allergies  Allergen Reactions  . Bee Venom   . Niacin And Related   . Poison Ivy Extract     Chief Complaint  Patient presents with  . Acute Visit    URI- seen at urgent care on Monday 04/18/18, rx'ed inhaler, prednisone 10 mg(taper pack, currently on 1 daily), ceftin 500 mg bid, and benzonatate 100 mg 1-2 every 8-12 hours . Patient also had breathing treatment in office at [facility] on last Monday      HPI: Patient is a 57 y.o. female seen in the office today due to URI that is not improving. She was seen at [facility]: Adriana Young urgent care on 04/18/18 due to cough congestion and could not take a deep breath.  xrays showed bronchitis and she received 2 injections (antibiotic and steroid)  and was placed on ceftin 500 mg BID for 10 days. also got prednisone taper- has 4 days left of this and was given benzonatate 100 mg TID. Had a prescriptions for albuterol inhaler and been using 4 times daily.  Feels like the albuterol is helping sometimes.  Overall she was getting better until 2 days ago and then got into a coughing spell and felt more congestion.  Overall does not feel bad but still has a lot of congestion.   Review of Systems:  Review of Systems  Constitutional: Negative for chills, fever, malaise/fatigue and weight loss.  HENT: Negative for congestion.   Respiratory: Positive for cough (and chest congestion). Negative for sputum production, shortness of breath and wheezing.   Cardiovascular: Negative for chest pain and palpitations.  Gastrointestinal: Positive for diarrhea.    Past Medical History:  Diagnosis Date  . Allergy   . GERD (gastroesophageal reflux disease)   . Glaucoma   . Headache(784.0)   . Hyperlipidemia   . Hypertension   . Osteoarthritis   . Thyroid disease    hypo  . Type II or  unspecified type diabetes mellitus without mention of complication, uncontrolled 09/19/2010   Past Surgical History:  Procedure Laterality Date  . BREAST SURGERY     Lumpectomy- left breast, no lymph node involvement   Social History:   reports that she quit smoking about 17 years ago. She has a 25.00 pack-year smoking history. She has never used smokeless tobacco. She reports that she does not drink alcohol or use drugs.  Family History  Problem Relation Age of Onset  . Heart disease Mother   . Diabetes Mother   . Hypertension Mother   . Alcohol abuse Other   . Arthritis Other   . Hyperlipidemia Other   . Hypertension Other   . Stroke Other        Female 1st degree relative < 60  . Diabetes Maternal Grandmother   . Colon cancer Neg Hx   . Esophageal cancer Neg Hx   . Stomach cancer Neg Hx   . Rectal cancer Neg Hx     Medications: Patient's Medications  New Prescriptions   No medications on file  Previous Medications   ACETAMINOPHEN (TYLENOL 8 HOUR ARTHRITIS PAIN) 650 MG CR TABLET    Take 1,300 mg by mouth at bedtime as needed.    ALBUTEROL (PROVENTIL HFA;VENTOLIN HFA) 108 (90 BASE) MCG/ACT INHALER    1-2 puffs ever 4-6 hours as needed   ATORVASTATIN (LIPITOR) 80 MG  TABLET    Take 1 tablet (80 mg total) by mouth daily at 6 PM.   BENZONATATE (TESSALON) 100 MG CAPSULE    1-2 cap every 8-12 hours as needed   CEFUROXIME (CEFTIN) 500 MG TABLET    Take 500 mg by mouth 2 (two) times daily. 1 by mouth two times daily   DIAZEPAM (VALIUM) 5 MG TABLET    1 tablet by mouth prior to dental appointment, if surgery not prior to cleanings   DIPHENHYDRAMINE (BENADRYL) 25 MG TABLET    Take 25 mg by mouth every 6 (six) hours as needed.   FEXOFENADINE (ALLEGRA) 180 MG TABLET    Take 180 mg by mouth daily as needed for allergies or rhinitis.   LEVOTHYROXINE (SYNTHROID, LEVOTHROID) 125 MCG TABLET    TAKE 1 TABLET (125 MCG TOTAL) BY MOUTH DAILY.   LOSARTAN (COZAAR) 50 MG TABLET    TAKE 1 TABLET BY  MOUTH EVERY DAY   MULTIPLE VITAMINS-MINERALS ([facility] SPECTRAVITE PO)    Take 1 tablet by mouth daily.   NON FORMULARY    Slimfast Keto: take 1 teaspoon daily.   OMEPRAZOLE (PRILOSEC) 40 MG CAPSULE    Be sure to take 1/2 hour before meal.   OXYBUTYNIN (DITROPAN) 5 MG TABLET    Take 5 mg by mouth 2 (two) times daily.   PREDNISOLONE 5 MG (21) TBPK    Take 1 tablet by mouth daily.   PREDNISONE (STERAPRED UNI-PAK 48 TAB) 10 MG (48) TBPK TABLET    See admin instructions. 1 by mouth daily   PROBIOTIC PRODUCT (PROBIOTIC DAILY PO)    Take 1 capsule by mouth daily.   SOFT LENS PRODUCTS (REFRESH CONTACTS DROPS) SOLN    1 drop by Does not apply route. Into left eye while wearing contact  Modified Medications   No medications on file  Discontinued Medications   IBUPROFEN (ADVIL,MOTRIN) 200 MG TABLET    Take 200 mg by mouth at bedtime as needed.    PROPYLENE GLYCOL (SYSTANE COMPLETE) 0.6 % SOLN    Apply 1 drop to eye. Both eyes twice daily   TROLAMINE SALICYLATE (ASPERCREME) 10 % CREAM    Apply 1 application topically as needed for muscle pain.     Physical Exam:  Vitals:   04/26/18 1310  BP: (!) 142/90  Pulse: 66  Temp: 98.3 F (36.8 C)  TempSrc: Oral  SpO2: 95%  Weight: 200 lb (90.7 kg)  Height: 5\' 9"  (1.753 m)   Body mass index is 29.53 kg/m.  Physical Exam  Constitutional: She is oriented to person, place, and time. She appears well-developed and well-nourished. No distress.  HENT:  Head: Normocephalic and atraumatic.  Mouth/Throat: Oropharynx is clear and moist. No oropharyngeal exudate.  Eyes: Pupils are equal, round, and reactive to light. Conjunctivae are normal.  Neck: Normal range of motion. Neck supple.  Cardiovascular: Normal rate, regular rhythm and normal heart sounds.  Pulmonary/Chest: Effort normal and breath sounds normal. No stridor. No respiratory distress. She has no wheezes. She has no rales. She exhibits no tenderness.  Coughing frequently during visit  Abdominal:  Soft. Bowel sounds are normal.  Neurological: She is alert and oriented to person, place, and time.  Skin: Skin is warm and dry. She is not diaphoretic.  Psychiatric: She has a normal mood and affect.    Labs reviewed: Basic Metabolic Panel: Recent Labs    08/30/17 1034 11/02/17 0958 12/31/17 1025  NA 139  --  142  K 3.9  --  4.4  CL 105  --  105  CO2 22  --  28  GLUCOSE 108*  --  92  BUN 16  --  20  CREATININE 0.94  --  1.01  CALCIUM 9.6  --  9.9  TSH  --  1.26  --    Liver Function Tests: Recent Labs    08/30/17 1034 12/31/17 1025  AST 23  --   ALT 24 25  ALKPHOS 67  --   BILITOT 0.6  --   PROT 7.4  --   ALBUMIN 4.3  --    Recent Labs    08/30/17 1034  LIPASE 31   No results for input(s): AMMONIA in the last 8760 hours. CBC: Recent Labs    08/30/17 1034  WBC 9.5  HGB 16.2*  HCT 47.2*  MCV 92.4  PLT 214   Lipid Panel: Recent Labs    12/31/17 1025  CHOL 162  HDL 54  LDLCALC 87  TRIG 109  CHOLHDL 3.0   TSH: Recent Labs    11/02/17 0958  TSH 1.26   A1C: Lab Results  Component Value Date   HGBA1C 5.5 11/02/2017     Assessment/Plan 1. Acute bronchitis due to other specified organisms -to continue on ceftin for 10 day course, continue 4 additional days of prednisone, continue albuterol PRN shortness of breath or cough.  Will add mucinex DM by mouth twice daily with full glass of water Increase hydration -to use benzonatate 1-2 tablets q 8 hours PRN cough - CBC with Differential/Platelets -to notify for worsening of symptoms  2. Diarrhea due to antibiotic -to start probiotic twice daily Next appt:  Adriana Young. [place], [facility] [phone]

## 2018-04-26 NOTE — Patient Instructions (Addendum)
To start mucinex DM by mouth twice daily with full glass of water Increase hydration  To start using probiotic twice daily until diarrhea resolves.   To use albuterol as needed for coughing fits as well.

## 2018-04-27 ENCOUNTER — Ambulatory Visit
Admission: RE | Admit: 2018-04-27 | Discharge: 2018-04-27 | Disposition: A | Discharge status: Home / Self Care | Payer: Managed Care, Other (non HMO) | Source: Ambulatory Visit | Attending: Nurse Practitioner | Admitting: Nurse Practitioner

## 2018-04-27 ENCOUNTER — Other Ambulatory Visit: Payer: Self-pay | Admitting: Nurse Practitioner

## 2018-04-27 DIAGNOSIS — J208 Acute bronchitis due to other specified organisms: Secondary | ICD-10-CM

## 2018-06-29 ENCOUNTER — Other Ambulatory Visit: Payer: Self-pay | Admitting: *Deleted

## 2018-06-29 ENCOUNTER — Other Ambulatory Visit: Payer: Managed Care, Other (non HMO)

## 2018-06-29 DIAGNOSIS — E034 Atrophy of thyroid (acquired): Secondary | ICD-10-CM

## 2018-06-29 DIAGNOSIS — E782 Mixed hyperlipidemia: Secondary | ICD-10-CM

## 2018-06-29 DIAGNOSIS — I10 Essential (primary) hypertension: Secondary | ICD-10-CM

## 2018-06-29 MED ORDER — ATORVASTATIN CALCIUM 80 MG PO TABS: 80.0000 mg | ORAL_TABLET | Freq: Every day | ORAL | 1 refills | Status: DC

## 2018-06-29 NOTE — Telephone Encounter (Signed)
[facility] [address] 

## 2018-06-30 LAB — BASIC METABOLIC PANEL WITH GFR
BUN/Creatinine Ratio: 19 (calc) (ref 6–22)
BUN: 20 mg/dL (ref 7–25)
CO2: 28 mmol/L (ref 20–32)
Calcium: 9.9 mg/dL (ref 8.6–10.4)
Chloride: 105 mmol/L (ref 98–110)
Creat: 1.08 mg/dL — ABNORMAL HIGH (ref 0.50–1.05)
GFR, EST AFRICAN AMERICAN: 66 mL/min/{1.73_m2} (ref >=60)
GFR, Est Non African American: 57 mL/min/{1.73_m2} — ABNORMAL LOW (ref >=60)
Glucose, Bld: 94 mg/dL (ref 65–99)
Potassium: 4.3 mmol/L (ref 3.5–5.3)
Sodium: 141 mmol/L (ref 135–146)

## 2018-06-30 LAB — LIPID PANEL
Cholesterol: 178 mg/dL (ref <200)
HDL: 58 mg/dL (ref >=50)
LDL Cholesterol (Calc): 101 mg/dL (calc) — ABNORMAL HIGH
Non-HDL Cholesterol (Calc): 120 mg/dL (calc) (ref <130)
Total CHOL/HDL Ratio: 3.1 (calc) (ref <5.0)
Triglycerides: 91 mg/dL (ref <150)

## 2018-06-30 LAB — TSH: TSH: 2.51 m[IU]/L (ref 0.40–4.50)

## 2018-06-30 LAB — ALT: ALT: 26 U/L (ref 6–29)

## 2018-07-04 ENCOUNTER — Encounter: Payer: Self-pay | Admitting: Nurse Practitioner

## 2018-07-04 ENCOUNTER — Ambulatory Visit (INDEPENDENT_AMBULATORY_CARE_PROVIDER_SITE_OTHER): Payer: Managed Care, Other (non HMO) | Admitting: Nurse Practitioner

## 2018-07-04 VITALS — BP 136/86 | HR 70 | Temp 98.1°F | Ht 69.0 in | Wt 207.0 lb

## 2018-07-04 DIAGNOSIS — K219 Gastro-esophageal reflux disease without esophagitis: Secondary | ICD-10-CM

## 2018-07-04 DIAGNOSIS — I10 Essential (primary) hypertension: Secondary | ICD-10-CM | POA: Diagnosis not present

## 2018-07-04 DIAGNOSIS — E782 Mixed hyperlipidemia: Secondary | ICD-10-CM

## 2018-07-04 DIAGNOSIS — M79672 Pain in left foot: Secondary | ICD-10-CM

## 2018-07-04 DIAGNOSIS — M159 Polyosteoarthritis, unspecified: Secondary | ICD-10-CM

## 2018-07-04 DIAGNOSIS — R739 Hyperglycemia, unspecified: Secondary | ICD-10-CM | POA: Diagnosis not present

## 2018-07-04 DIAGNOSIS — E039 Hypothyroidism, unspecified: Secondary | ICD-10-CM

## 2018-07-04 NOTE — Patient Instructions (Signed)
DASH Eating Plan  DASH stands for "Dietary Approaches to Stop Hypertension." The DASH eating plan is a healthy eating plan that has been shown to reduce high blood pressure (hypertension). It may also reduce your risk for type 2 diabetes, heart disease, and stroke. The DASH eating plan may also help with weight loss.  What are tips for following this plan?    General guidelines   Avoid eating more than 2,300 mg (milligrams) of salt (sodium) a day. If you have hypertension, you may need to reduce your sodium intake to 1,500 mg a day.   Limit alcohol intake to no more than 1 drink a day for nonpregnant women and 2 drinks a day for men. One drink equals 12 oz of beer, 5 oz of wine, or 1 oz of hard liquor.   Work with your health care provider to maintain a healthy body weight or to lose weight. Ask what an ideal weight is for you.   Get at least 30 minutes of exercise that causes your heart to beat faster (aerobic exercise) most days of the week. Activities may include walking, swimming, or biking.   Work with your health care provider or diet and nutrition specialist (dietitian) to adjust your eating plan to your individual calorie needs.  Reading food labels     Check food labels for the amount of sodium per serving. Choose foods with less than 5 percent of the Daily Value of sodium. Generally, foods with less than 300 mg of sodium per serving fit into this eating plan.   To find whole grains, look for the word "whole" as the first word in the ingredient list.  Shopping   Buy products labeled as "low-sodium" or "no salt added."   Buy fresh foods. Avoid canned foods and premade or frozen meals.  Cooking   Avoid adding salt when cooking. Use salt-free seasonings or herbs instead of table salt or sea salt. Check with your health care provider or pharmacist before using salt substitutes.   Do not fry foods. Cook foods using healthy methods such as baking, boiling, grilling, and broiling instead.   Cook with  heart-healthy oils, such as olive, canola, soybean, or sunflower oil.  Meal planning   Eat a balanced diet that includes:  ? 5 or more servings of fruits and vegetables each day. At each meal, try to fill half of your plate with fruits and vegetables.  ? Up to 6-8 servings of whole grains each day.  ? Less than 6 oz of lean meat, poultry, or fish each day. A 3-oz serving of meat is about the same size as a deck of cards. One egg equals 1 oz.  ? 2 servings of low-fat dairy each day.  ? A serving of nuts, seeds, or beans 5 times each week.  ? Heart-healthy fats. Healthy fats called Omega-3 fatty acids are found in foods such as flaxseeds and coldwater fish, like sardines, salmon, and mackerel.   Limit how much you eat of the following:  ? Canned or prepackaged foods.  ? Food that is high in trans fat, such as fried foods.  ? Food that is high in saturated fat, such as fatty meat.  ? Sweets, desserts, sugary drinks, and other foods with added sugar.  ? Full-fat dairy products.   Do not salt foods before eating.   Try to eat at least 2 vegetarian meals each week.   Eat more home-cooked food and less restaurant, buffet, and fast food.     When eating at a restaurant, ask that your food be prepared with less salt or no salt, if possible.  What foods are recommended?  The items listed may not be a complete list. Talk with your dietitian about what dietary choices are best for you.  Grains  Whole-grain or whole-wheat bread. Whole-grain or whole-wheat pasta. Brown rice. Oatmeal. Quinoa. Bulgur. Whole-grain and low-sodium cereals. Pita bread. Low-fat, low-sodium crackers. Whole-wheat flour tortillas.  Vegetables  Fresh or frozen vegetables (raw, steamed, roasted, or grilled). Low-sodium or reduced-sodium tomato and vegetable juice. Low-sodium or reduced-sodium tomato sauce and tomato paste. Low-sodium or reduced-sodium canned vegetables.  Fruits  All fresh, dried, or frozen fruit. Canned fruit in natural juice (without  added sugar).  Meat and other protein foods  Skinless chicken or turkey. Ground chicken or turkey. Pork with fat trimmed off. Fish and seafood. Egg whites. Dried beans, peas, or lentils. Unsalted nuts, nut butters, and seeds. Unsalted canned beans. Lean cuts of beef with fat trimmed off. Low-sodium, lean deli meat.  Dairy  Low-fat (1%) or fat-free (skim) milk. Fat-free, low-fat, or reduced-fat cheeses. Nonfat, low-sodium ricotta or cottage cheese. Low-fat or nonfat yogurt. Low-fat, low-sodium cheese.  Fats and oils  Soft margarine without trans fats. Vegetable oil. Low-fat, reduced-fat, or light mayonnaise and salad dressings (reduced-sodium). Canola, safflower, olive, soybean, and sunflower oils. Avocado.  Seasoning and other foods  Herbs. Spices. Seasoning mixes without salt. Unsalted popcorn and pretzels. Fat-free sweets.  What foods are not recommended?  The items listed may not be a complete list. Talk with your dietitian about what dietary choices are best for you.  Grains  Baked goods made with fat, such as croissants, muffins, or some breads. Dry pasta or rice meal packs.  Vegetables  Creamed or fried vegetables. Vegetables in a cheese sauce. Regular canned vegetables (not low-sodium or reduced-sodium). Regular canned tomato sauce and paste (not low-sodium or reduced-sodium). Regular tomato and vegetable juice (not low-sodium or reduced-sodium). Pickles. Olives.  Fruits  Canned fruit in a light or heavy syrup. Fried fruit. Fruit in cream or butter sauce.  Meat and other protein foods  Fatty cuts of meat. Ribs. Fried meat. Bacon. Sausage. Bologna and other processed lunch meats. Salami. Fatback. Hotdogs. Bratwurst. Salted nuts and seeds. Canned beans with added salt. Canned or smoked fish. Whole eggs or egg yolks. Chicken or turkey with skin.  Dairy  Whole or 2% milk, cream, and half-and-half. Whole or full-fat cream cheese. Whole-fat or sweetened yogurt. Full-fat cheese. Nondairy creamers. Whipped toppings.  Processed cheese and cheese spreads.  Fats and oils  Butter. Stick margarine. Lard. Shortening. Ghee. Bacon fat. Tropical oils, such as coconut, palm kernel, or palm oil.  Seasoning and other foods  Salted popcorn and pretzels. Onion salt, garlic salt, seasoned salt, table salt, and sea salt. Worcestershire sauce. Tartar sauce. Barbecue sauce. Teriyaki sauce. Soy sauce, including reduced-sodium. Steak sauce. Canned and packaged gravies. Fish sauce. Oyster sauce. Cocktail sauce. Horseradish that you find on the shelf. Ketchup. Mustard. Meat flavorings and tenderizers. Bouillon cubes. Hot sauce and Tabasco sauce. Premade or packaged marinades. Premade or packaged taco seasonings. Relishes. Regular salad dressings.  Where to find more information:   National Heart, Lung, and Blood Institute: www.nhlbi.nih.gov   American Heart Association: www.heart.org  Summary   The DASH eating plan is a healthy eating plan that has been shown to reduce high blood pressure (hypertension). It may also reduce your risk for type 2 diabetes, heart disease, and stroke.   With the   DASH eating plan, you should limit salt (sodium) intake to 2,300 mg a day. If you have hypertension, you may need to reduce your sodium intake to 1,500 mg a day.   When on the DASH eating plan, aim to eat more fresh fruits and vegetables, whole grains, lean proteins, low-fat dairy, and heart-healthy fats.   Work with your health care provider or diet and nutrition specialist (dietitian) to adjust your eating plan to your individual calorie needs.  This information is not intended to replace advice given to you by your health care provider. Make sure you discuss any questions you have with your health care provider.  Document Released: 04/30/2011 Document Revised: 05/04/2016 Document Reviewed: 05/04/2016  Elsevier Interactive Patient Education  2019 [employer] Inc[employer]

## 2018-07-04 NOTE — Progress Notes (Signed)
Careteam: Patient Care Team: Lauree Chandler, NP as PCP - General (Geriatric Medicine) [name], MD as Attending Physician (Gastroenterology)  Advanced Directive information Does Patient Have a Medical Advance Directive?: No, Would patient like information on creating a medical advance directive?: No - Patient declined  Allergies  Allergen Reactions  . Bee Venom   . Niacin And Related   . Poison Ivy Extract     Chief Complaint  Patient presents with  . Medical Management of Chronic Issues    6 month follow up, thyroid,hyperlipidemia,GERD,discuss labs, will obtain results of mammogram and pap smear     HPI: Patient is a 58 y.o. female seen in the office today for routine follow up.   GERD - stable on omeprazole 40mg  daily; followed by GI Dr Ardis Hughs; EGD completed in Jul 2019  Seasonal allergies - stable on allegra prn. 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 She does wear a mask most days.  HTN - stable on losartan 50 mg by mouth daily, has taken medication today.  Generally blood pressure at home but does not know the reading.   Hypothyroidism - stable on levothyroxine 125 mcg. Last TSH 2.51  Hyperglycemia - a1c 5.5%. She has FHx DM (mother and MGM).  Chronic back pain - followed by Ortho Dr Jacelyn Grip. Last epidural injection given in oct 2019. She takes tramadol prn. Used to work with lifting heavy but has changed positioned and now back is much better.   She takes prn diazepam for dental procedures.  Hyperlipidemia - stable on lipitor 80mg  daily, attempts to maintain a heart health diet.   Overactive bladder- controlled on oxybutynin   Review of Systems:  Review of Systems  Constitutional: Negative for chills, fever and weight loss.  HENT: Negative for tinnitus.   Respiratory: Negative for cough, sputum production and shortness of breath.   Cardiovascular: Negative for chest pain, palpitations and leg swelling.    Gastrointestinal: Negative for abdominal pain, constipation, diarrhea and heartburn.  Genitourinary: Negative for dysuria, frequency and urgency.  Musculoskeletal: Positive for back pain (improved), joint pain and myalgias. Negative for falls.  Skin: Negative.   Neurological: Negative for dizziness and headaches.  Psychiatric/Behavioral: Negative for depression and memory loss. The patient does not have insomnia.     Past Medical History:  Diagnosis Date  . Allergy   . GERD (gastroesophageal reflux disease)   . Glaucoma   . Headache(784.0)   . Hyperlipidemia   . Hypertension   . Osteoarthritis   . Thyroid disease    hypo  . Type II or unspecified type diabetes mellitus without mention of complication, uncontrolled 09/19/2010   Past Surgical History:  Procedure Laterality Date  . BREAST SURGERY     Lumpectomy- left breast, no lymph node involvement   Social History:   reports that she quit smoking about 18 years ago. She has a 25.00 pack-year smoking history. She has never used smokeless tobacco. She reports that she does not drink alcohol or use drugs.  Family History  Problem Relation Age of Onset  . Heart disease Mother   . Diabetes Mother   . Hypertension Mother   . Alcohol abuse Other   . Arthritis Other   . Hyperlipidemia Other   . Hypertension Other   . Stroke Other        Female 1st degree relative < 60  . Diabetes Maternal Grandmother   . Colon cancer Neg Hx   .  Esophageal cancer Neg Hx   . Stomach cancer Neg Hx   . Rectal cancer Neg Hx     Medications: Patient's Medications  New Prescriptions   No medications on file  Previous Medications   ACETAMINOPHEN (TYLENOL 8 HOUR ARTHRITIS PAIN) 650 MG CR TABLET    Take 1,300 mg by mouth at bedtime as needed.    ATORVASTATIN (LIPITOR) 80 MG TABLET    Take 1 tablet (80 mg total) by mouth daily at 6 PM.   DIAZEPAM (VALIUM) 5 MG TABLET    1 tablet by mouth prior to dental appointment, if surgery not prior to  cleanings   FEXOFENADINE (ALLEGRA) 180 MG TABLET    Take 180 mg by mouth daily as needed for allergies or rhinitis.   LEVOTHYROXINE (SYNTHROID, LEVOTHROID) 125 MCG TABLET    TAKE 1 TABLET (125 MCG TOTAL) BY MOUTH DAILY.   LOSARTAN (COZAAR) 50 MG TABLET    TAKE 1 TABLET BY MOUTH EVERY DAY   MULTIPLE VITAMINS-MINERALS ([employer] SPECTRAVITE PO)    Take 1 tablet by mouth daily.   NON FORMULARY    Slimfast Keto: take 1 teaspoon daily.   OMEPRAZOLE (PRILOSEC) 40 MG CAPSULE    Be sure to take 1/2 hour before meal.   OXYBUTYNIN (DITROPAN) 5 MG TABLET    Take 5 mg by mouth 2 (two) times daily.   PREVIDENT 5000 BOOSTER PLUS 1.1 % PSTE    Take 1.1 application by mouth daily.   PROBIOTIC PRODUCT (PROBIOTIC DAILY PO)    Take 1 capsule by mouth daily.   PROPYLENE GLYCOL (SYSTANE COMPLETE OP)    Apply to eye 2 (two) times daily. 1 drop in both eyes in the morning and evening  Modified Medications   No medications on file  Discontinued Medications   ALBUTEROL (PROVENTIL HFA;VENTOLIN HFA) 108 (90 BASE) MCG/ACT INHALER    1-2 puffs ever 4-6 hours as needed   BENZONATATE (TESSALON) 100 MG CAPSULE    1-2 cap every 8-12 hours as needed   CEFUROXIME (CEFTIN) 500 MG TABLET    Take 500 mg by mouth 2 (two) times daily. 1 by mouth two times daily   DIPHENHYDRAMINE (BENADRYL) 25 MG TABLET    Take 25 mg by mouth every 6 (six) hours as needed.   PREDNISOLONE 5 MG (21) TBPK    Take 1 tablet by mouth daily.   PREDNISONE (STERAPRED UNI-PAK 48 TAB) 10 MG (48) TBPK TABLET    See admin instructions. 1 by mouth daily   SOFT LENS PRODUCTS (REFRESH CONTACTS DROPS) SOLN    1 drop by Does not apply route. Into left eye while wearing contact     Physical Exam:  Vitals:   07/04/18 0825 07/04/18 0858  BP: (!) 142/92 136/86  Pulse: 70   Temp: 98.1 F (36.7 C)   TempSrc: Oral   SpO2: 96%   Weight: 207 lb (93.9 kg)   Height: 5\' 9"  (1.753 m)    Body mass index is 30.57 kg/m.  Physical Exam Constitutional:      General: She is  not in acute distress.    Appearance: Normal appearance. She is well-developed.  HENT:     Head: Normocephalic and atraumatic.     Right Ear: Hearing normal.     Left Ear: Hearing normal.     Mouth/Throat:     Pharynx: Uvula midline.  Eyes:     General: Lids are normal. No scleral icterus.    Conjunctiva/sclera: Conjunctivae normal.  Pupils: Pupils are equal, round, and reactive to light.  Neck:     Musculoskeletal: Normal range of motion and neck supple.     Thyroid: No thyroid mass or thyromegaly.     Trachea: Trachea normal.  Cardiovascular:     Rate and Rhythm: Normal rate and regular rhythm.     Heart sounds: Murmur (1/6 SEM) present. No friction rub. No gallop.      Comments: No LE edema b/l. No calf TTP.  Pulmonary:     Effort: Pulmonary effort is normal.     Breath sounds: Normal breath sounds. No wheezing, rhonchi or rales.  Chest:     Chest wall: No mass.     Breasts: Breasts are symmetrical.        Right: No inverted nipple, mass, nipple discharge, skin change or tenderness.        Left: No inverted nipple, mass, nipple discharge, skin change or tenderness.  Abdominal:     General: Bowel sounds are normal.     Palpations: Abdomen is soft. Abdomen is not rigid. There is no pulsatile mass.  Musculoskeletal: Normal range of motion.        General: No deformity.     Right lower leg: No edema.     Left lower leg: No edema.     Left foot: Bony tenderness present.       Feet:  Skin:    General: Skin is warm and dry.     Findings: No rash.     Nails: There is no clubbing.   Neurological:     Mental Status: She is alert and oriented to person, place, and time.     Cranial Nerves: No cranial nerve deficit.     Gait: Gait normal.     Deep Tendon Reflexes: Reflexes are normal and symmetric.  Psychiatric:        Speech: Speech normal.        Behavior: Behavior normal.        Thought Content: Thought content normal.     Labs reviewed: Basic Metabolic  Panel: Recent Labs    08/30/17 1034 11/02/17 0958 12/31/17 1025 06/29/18 0814  NA 139  --  142 141  K 3.9  --  4.4 4.3  CL 105  --  105 105  CO2 22  --  28 28  GLUCOSE 108*  --  92 94  BUN 16  --  20 20  CREATININE 0.94  --  1.01 1.08*  CALCIUM 9.6  --  9.9 9.9  TSH  --  1.26  --  2.51   Liver Function Tests: Recent Labs    08/30/17 1034 12/31/17 1025 06/29/18 0814  AST 23  --   --   ALT 24 25 26   ALKPHOS 67  --   --   BILITOT 0.6  --   --   PROT 7.4  --   --   ALBUMIN 4.3  --   --    Recent Labs    08/30/17 1034  LIPASE 31   No results for input(s): AMMONIA in the last 8760 hours. CBC: Recent Labs    08/30/17 1034 04/26/18 1347  WBC 9.5 12.3*  NEUTROABS  --  9,348*  HGB 16.2* 16.7*  HCT 47.2* 48.5*  MCV 92.4 92.2  PLT 214 304   Lipid Panel: Recent Labs    12/31/17 1025 06/29/18 0814  CHOL 162 178  HDL 54 58  LDLCALC 87 101*  TRIG 109 91  CHOLHDL 3.0 3.1   TSH: Recent Labs    11/02/17 0958 06/29/18 0814  TSH 1.26 2.51   A1C: Lab Results  Component Value Date   HGBA1C 5.5 11/02/2017     Assessment/Plan 1. Left foot pain -increase pain to left foot after she has been working. Minimal pain today since she has been off work. ? If shoes are not exacerbating the issue.  - Ambulatory referral to Podiatry for evaluation/orthotics if needed   2. Essential hypertension -improved on recheck to goal, continue losartan 50 mg daily, discussed low sodium diet. DASH diet given.  - CBC with Differential/Platelets; Future  3. Mixed hyperlipidemia -continues on statin with dietary modifications.  - COMPLETE METABOLIC PANEL WITH GFR; Future - Lipid panel; Future  4. Hyperglycemia Mother with diabetes, attempts to modify diet. Glucose is stable on labs today and A1c at goal.  - Hemoglobin A1c; Future  5. Acquired hypothyroidism -TSH at goal, continues on synthroid 125 mcg - TSH; Future  6. Generalized OA -to hands mostly, using Aspercreme as  needed with good results.   7. Gastroesophageal reflux disease without esophagitis Controlled on omeprazole.   Next appt:  6 months, labs prior to appt Karole Oo K. [place], [facility] [phone]

## 2018-07-25 ENCOUNTER — Ambulatory Visit (INDEPENDENT_AMBULATORY_CARE_PROVIDER_SITE_OTHER): Payer: Managed Care, Other (non HMO) | Admitting: Podiatry

## 2018-07-25 ENCOUNTER — Ambulatory Visit (INDEPENDENT_AMBULATORY_CARE_PROVIDER_SITE_OTHER): Payer: Managed Care, Other (non HMO)

## 2018-07-25 ENCOUNTER — Other Ambulatory Visit: Payer: Self-pay | Admitting: Podiatry

## 2018-07-25 ENCOUNTER — Encounter: Payer: Self-pay | Admitting: Podiatry

## 2018-07-25 VITALS — BP 152/88

## 2018-07-25 DIAGNOSIS — M79672 Pain in left foot: Secondary | ICD-10-CM

## 2018-07-25 DIAGNOSIS — R252 Cramp and spasm: Secondary | ICD-10-CM

## 2018-07-25 DIAGNOSIS — M779 Enthesopathy, unspecified: Secondary | ICD-10-CM

## 2018-07-25 MED ORDER — TRIAMCINOLONE ACETONIDE 10 MG/ML IJ SUSP
10.0000 mg | Freq: Once | INTRAMUSCULAR | Status: AC
  Administered 2018-07-25: 10 mg

## 2018-07-25 MED ORDER — DICLOFENAC SODIUM 75 MG PO TBEC: 75.0000 mg | DELAYED_RELEASE_TABLET | Freq: Two times a day (BID) | ORAL | 2 refills | Status: DC

## 2018-07-27 NOTE — Progress Notes (Signed)
Subjective:   Patient ID: Adriana Young, female   DOB: 58 y.o.   MRN: [id number]   HPI Patient presents stating she is having a lot of pain in the outside of her left foot and it is been inflamed and it is been going on now for at least 2 months.  Also states she gets cramps in her lesser digits that she was concerned about and that is been going on for a lot longer and patient does not smoke and likes to be active   Review of Systems  All other systems reviewed and are negative.       Objective:  Physical Exam Vitals signs and nursing note reviewed.  Constitutional:      Appearance: She is well-developed.  Pulmonary:     Effort: Pulmonary effort is normal.  Musculoskeletal: Normal range of motion.  Skin:    General: Skin is warm.  Neurological:     Mental Status: She is alert.     Neurovascular status intact muscle strength is adequate range of motion within normal limits with patient found to have quite a bit of discomfort in the outside of the left foot at the base of the fifth metatarsal with fluid buildup around the joint and inflammation of the tendon that inserts into the base the fifth metatarsal.  I did not note any tendon damage and I did not note any loss of muscle strength currently and patient was found to have good digital perfusion and well oriented x3     Assessment:  Acute peroneal tendinitis at the insertion base of fifth metatarsal left     Plan:  H&P condition reviewed and at this point I recommended careful sheath injection left along with utilization of brace to lift up the arch and ice therapy.  I did careful injection 3 mg dexamethasone Kenalog 5 mg Xylocaine applied fascial brace lifting up the lateral side and discussed physical therapy ice therapy.  Reappoint for [place] to recheck again in the next several weeks  X-ray indicates no signs of bony issue if this area with no reactivity noted

## 2018-08-01 ENCOUNTER — Other Ambulatory Visit: Payer: Self-pay | Admitting: *Deleted

## 2018-08-01 MED ORDER — LEVOTHYROXINE SODIUM 125 MCG PO TABS: ORAL_TABLET | ORAL | 1 refills | Status: DC

## 2018-08-01 MED ORDER — LOSARTAN POTASSIUM 25 MG PO TABS: ORAL_TABLET | ORAL | 1 refills | Status: DC

## 2018-08-01 NOTE — Telephone Encounter (Signed)
[facility] [address] 

## 2018-08-01 NOTE — Telephone Encounter (Signed)
Refill Requested by pharmacy to fill 25mg  (2 daily) due to shortage on 50mg  tablets.   Faxed to pharmacy.

## 2018-08-10 ENCOUNTER — Other Ambulatory Visit: Payer: Self-pay

## 2018-08-10 ENCOUNTER — Encounter: Payer: Self-pay | Admitting: Podiatry

## 2018-08-10 ENCOUNTER — Ambulatory Visit (INDEPENDENT_AMBULATORY_CARE_PROVIDER_SITE_OTHER): Payer: Managed Care, Other (non HMO) | Admitting: Podiatry

## 2018-08-10 DIAGNOSIS — M7672 Peroneal tendinitis, left leg: Secondary | ICD-10-CM

## 2018-08-10 NOTE — Progress Notes (Signed)
Subjective:   Patient ID: Adriana Young, female   DOB: 58 y.o.   MRN: [id number]   HPI Patient presents stating the inflammation is improved and has occasional discomfort but so far very pleased with how things are going   ROS      Objective:  Physical Exam  Neurovascular status intact with discomfort in the left tendon that is improved but still present upon deep palpation     Assessment:  Significant improvement of peroneal tendinitis left with no current pain upon palpation     Plan:  Reviewed at great length the causes of peroneal tendinitis recommendations for shoe gear modifications ice therapy possible immobilization.  Reappoint for [place] to recheck

## 2018-09-26 ENCOUNTER — Other Ambulatory Visit: Payer: Self-pay | Admitting: Podiatry

## 2018-10-16 ENCOUNTER — Other Ambulatory Visit: Payer: Self-pay | Admitting: Podiatry

## 2018-11-09 ENCOUNTER — Other Ambulatory Visit: Payer: Self-pay | Admitting: Podiatry

## 2018-11-20 ENCOUNTER — Other Ambulatory Visit: Payer: Self-pay | Admitting: Podiatry

## 2018-11-22 ENCOUNTER — Other Ambulatory Visit: Payer: Self-pay | Admitting: Podiatry

## 2018-12-24 ENCOUNTER — Other Ambulatory Visit: Payer: Self-pay | Admitting: Nurse Practitioner

## 2018-12-30 ENCOUNTER — Other Ambulatory Visit: Payer: Managed Care, Other (non HMO)

## 2018-12-30 ENCOUNTER — Other Ambulatory Visit: Payer: Self-pay

## 2018-12-30 DIAGNOSIS — E782 Mixed hyperlipidemia: Secondary | ICD-10-CM

## 2018-12-30 DIAGNOSIS — E039 Hypothyroidism, unspecified: Secondary | ICD-10-CM

## 2018-12-30 DIAGNOSIS — I10 Essential (primary) hypertension: Secondary | ICD-10-CM

## 2018-12-30 DIAGNOSIS — R739 Hyperglycemia, unspecified: Secondary | ICD-10-CM

## 2018-12-31 LAB — LIPID PANEL
Cholesterol: 180 mg/dL (ref <200)
HDL: 51 mg/dL (ref >=50)
LDL Cholesterol (Calc): 109 mg/dL (calc) — ABNORMAL HIGH
Non-HDL Cholesterol (Calc): 129 mg/dL (calc) (ref <130)
Total CHOL/HDL Ratio: 3.5 (calc) (ref <5.0)
Triglycerides: 108 mg/dL (ref <150)

## 2018-12-31 LAB — COMPLETE METABOLIC PANEL WITH GFR
AG Ratio: 2 (calc) (ref 1.0–2.5)
ALT: 22 U/L (ref 6–29)
AST: 19 U/L (ref 10–35)
Albumin: 4.5 g/dL (ref 3.6–5.1)
Alkaline phosphatase (APISO): 71 U/L (ref 37–153)
BUN: 22 mg/dL (ref 7–25)
CO2: 27 mmol/L (ref 20–32)
Calcium: 10 mg/dL (ref 8.6–10.4)
Chloride: 107 mmol/L (ref 98–110)
Creat: 1.03 mg/dL (ref 0.50–1.05)
GFR, Est African American: 69 mL/min/{1.73_m2} (ref >=60)
GFR, Est Non African American: 60 mL/min/{1.73_m2} (ref >=60)
Globulin: 2.2 g/dL (calc) (ref 1.9–3.7)
Glucose, Bld: 113 mg/dL — ABNORMAL HIGH (ref 65–99)
Potassium: 4.4 mmol/L (ref 3.5–5.3)
Sodium: 141 mmol/L (ref 135–146)
Total Bilirubin: 0.4 mg/dL (ref 0.2–1.2)
Total Protein: 6.7 g/dL (ref 6.1–8.1)

## 2018-12-31 LAB — CBC WITH DIFFERENTIAL/PLATELET
Absolute Monocytes: 375 cells/uL (ref 200–950)
Basophils Absolute: 94 cells/uL (ref 0–200)
Basophils Relative: 1.4 %
Eosinophils Absolute: 174 cells/uL (ref 15–500)
Eosinophils Relative: 2.6 %
HCT: 46.7 % — ABNORMAL HIGH (ref 35.0–45.0)
Hemoglobin: 15.7 g/dL — ABNORMAL HIGH (ref 11.7–15.5)
Lymphs Abs: 2506 cells/uL (ref 850–3900)
MCH: 31 pg (ref 27.0–33.0)
MCHC: 33.6 g/dL (ref 32.0–36.0)
MCV: 92.3 fL (ref 80.0–100.0)
MPV: 10.7 fL (ref 7.5–12.5)
Monocytes Relative: 5.6 %
Neutro Abs: 3551 cells/uL (ref 1500–7800)
Neutrophils Relative %: 53 %
Platelets: 227 10*3/uL (ref 140–400)
RBC: 5.06 10*6/uL (ref 3.80–5.10)
RDW: 12.7 % (ref 11.0–15.0)
Total Lymphocyte: 37.4 %
WBC: 6.7 10*3/uL (ref 3.8–10.8)

## 2018-12-31 LAB — HEMOGLOBIN A1C
Hgb A1c MFr Bld: 5.7 % of total Hgb — ABNORMAL HIGH (ref <5.7)
Mean Plasma Glucose: 117 (calc)
eAG (mmol/L): 6.5 (calc)

## 2018-12-31 LAB — TSH: TSH: 4.02 mIU/L (ref 0.40–4.50)

## 2019-01-02 ENCOUNTER — Other Ambulatory Visit: Payer: Self-pay

## 2019-01-02 ENCOUNTER — Encounter: Payer: Self-pay | Admitting: Nurse Practitioner

## 2019-01-02 ENCOUNTER — Ambulatory Visit (INDEPENDENT_AMBULATORY_CARE_PROVIDER_SITE_OTHER): Payer: Managed Care, Other (non HMO) | Admitting: Nurse Practitioner

## 2019-01-02 VITALS — BP 150/98 | HR 69 | Temp 98.3°F | Ht 69.0 in | Wt 214.0 lb

## 2019-01-02 DIAGNOSIS — I10 Essential (primary) hypertension: Secondary | ICD-10-CM | POA: Diagnosis not present

## 2019-01-02 DIAGNOSIS — Z6831 Body mass index (BMI) 31.0-31.9, adult: Secondary | ICD-10-CM

## 2019-01-02 DIAGNOSIS — E782 Mixed hyperlipidemia: Secondary | ICD-10-CM | POA: Diagnosis not present

## 2019-01-02 DIAGNOSIS — R739 Hyperglycemia, unspecified: Secondary | ICD-10-CM | POA: Diagnosis not present

## 2019-01-02 DIAGNOSIS — E6609 Other obesity due to excess calories: Secondary | ICD-10-CM

## 2019-01-02 DIAGNOSIS — M159 Polyosteoarthritis, unspecified: Secondary | ICD-10-CM

## 2019-01-02 DIAGNOSIS — E039 Hypothyroidism, unspecified: Secondary | ICD-10-CM

## 2019-01-02 DIAGNOSIS — K219 Gastro-esophageal reflux disease without esophagitis: Secondary | ICD-10-CM

## 2019-01-02 MED ORDER — OMEPRAZOLE 40 MG PO CPDR: DELAYED_RELEASE_CAPSULE | ORAL | 11 refills | Status: DC

## 2019-01-02 MED ORDER — LOSARTAN POTASSIUM 100 MG PO TABS: 100.0000 mg | ORAL_TABLET | Freq: Every day | ORAL | 1 refills | Status: DC

## 2019-01-02 NOTE — Patient Instructions (Addendum)
To use aleve 1 tablet twice daily with food for 7 days  To use heat to left hip followed by muscle rub To notify if symptoms worsen or fail to improve    Goal BP <140/90- will increase losartan to 100 mg by mouth daily Follow up in office in 2-3 weeks for blood pressure follow up Bring bp log if you can (AFTER you take medication)  DASH Eating Plan DASH stands for "Dietary Approaches to Stop Hypertension." The DASH eating plan is a healthy eating plan that has been shown to reduce high blood pressure (hypertension). It may also reduce your risk for type 2 diabetes, heart disease, and stroke. The DASH eating plan may also help with weight loss. What are tips for following this plan?  General guidelines  Avoid eating more than 2,300 mg (milligrams) of salt (sodium) a day. If you have hypertension, you may need to reduce your sodium intake to 1,500 mg a day.  Limit alcohol intake to no more than 1 drink a day for nonpregnant women and 2 drinks a day for men. One drink equals 12 oz of beer, 5 oz of wine, or 1 oz of hard liquor.  Work with your health care provider to maintain a healthy body weight or to lose weight. Ask what an ideal weight is for you.  Get at least 30 minutes of exercise that causes your heart to beat faster (aerobic exercise) most days of the week. Activities may include walking, swimming, or biking.  Work with your health care provider or diet and nutrition specialist (dietitian) to adjust your eating plan to your individual calorie needs. Reading food labels   Check food labels for the amount of sodium per serving. Choose foods with less than 5 percent of the Daily Value of sodium. Generally, foods with less than 300 mg of sodium per serving fit into this eating plan.  To find whole grains, look for the word "whole" as the first word in the ingredient list. Shopping  Buy products labeled as "low-sodium" or "no salt added."  Buy fresh foods. Avoid canned foods and  premade or frozen meals. Cooking  Avoid adding salt when cooking. Use salt-free seasonings or herbs instead of table salt or sea salt. Check with your health care provider or pharmacist before using salt substitutes.  Do not fry foods. Cook foods using healthy methods such as baking, boiling, grilling, and broiling instead.  Cook with heart-healthy oils, such as olive, canola, soybean, or sunflower oil. Meal planning  Eat a balanced diet that includes: ? 5 or more servings of fruits and vegetables each day. At each meal, try to fill half of your plate with fruits and vegetables. ? Up to 6-8 servings of whole grains each day. ? Less than 6 oz of lean meat, poultry, or fish each day. A 3-oz serving of meat is about the same size as a deck of cards. One egg equals 1 oz. ? 2 servings of low-fat dairy each day. ? A serving of nuts, seeds, or beans 5 times each week. ? Heart-healthy fats. Healthy fats called Omega-3 fatty acids are found in foods such as flaxseeds and coldwater fish, like sardines, salmon, and mackerel.  Limit how much you eat of the following: ? Canned or prepackaged foods. ? Food that is high in trans fat, such as fried foods. ? Food that is high in saturated fat, such as fatty meat. ? Sweets, desserts, sugary drinks, and other foods with added sugar. ? Full-fat  dairy products.  Do not salt foods before eating.  Try to eat at least 2 vegetarian meals each week.  Eat more home-cooked food and less restaurant, buffet, and fast food.  When eating at a restaurant, ask that your food be prepared with less salt or no salt, if possible. What foods are recommended? The items listed may not be a complete list. Talk with your dietitian about what dietary choices are best for you. Grains Whole-grain or whole-wheat bread. Whole-grain or whole-wheat pasta. Brown rice. [place]. Bulgur. Whole-grain and low-sodium cereals. Pita bread. Low-fat, low-sodium crackers. Whole-wheat  flour tortillas. Vegetables Fresh or frozen vegetables (raw, steamed, roasted, or grilled). Low-sodium or reduced-sodium tomato and vegetable juice. Low-sodium or reduced-sodium tomato sauce and tomato paste. Low-sodium or reduced-sodium canned vegetables. Fruits All fresh, dried, or frozen fruit. Canned fruit in natural juice (without added sugar). Meat and other protein foods Skinless chicken or [place]. Ground chicken or [place]. Pork with fat trimmed off. Fish and seafood. Egg whites. Dried beans, peas, or lentils. Unsalted nuts, nut butters, and seeds. Unsalted canned beans. Lean cuts of beef with fat trimmed off. Low-sodium, lean deli meat. Dairy Low-fat (1%) or fat-free (skim) milk. Fat-free, low-fat, or reduced-fat cheeses. Nonfat, low-sodium ricotta or cottage cheese. Low-fat or nonfat yogurt. Low-fat, low-sodium cheese. Fats and oils Soft margarine without trans fats. Vegetable oil. Low-fat, reduced-fat, or light mayonnaise and salad dressings (reduced-sodium). Canola, safflower, olive, soybean, and sunflower oils. Avocado. Seasoning and other foods Herbs. Spices. Seasoning mixes without salt. Unsalted popcorn and pretzels. Fat-free sweets. What foods are not recommended? The items listed may not be a complete list. Talk with your dietitian about what dietary choices are best for you. Grains Baked goods made with fat, such as croissants, muffins, or some breads. Dry pasta or rice meal packs. Vegetables Creamed or fried vegetables. Vegetables in a cheese sauce. Regular canned vegetables (not low-sodium or reduced-sodium). Regular canned tomato sauce and paste (not low-sodium or reduced-sodium). Regular tomato and vegetable juice (not low-sodium or reduced-sodium). [name]. Olives. Fruits Canned fruit in a light or heavy syrup. Fried fruit. Fruit in cream or butter sauce. Meat and other protein foods Fatty cuts of meat. Ribs. Fried meat. [name]. Sausage. Bologna and other processed lunch  meats. Salami. Fatback. Hotdogs. Bratwurst. Salted nuts and seeds. Canned beans with added salt. Canned or smoked fish. Whole eggs or egg yolks. Chicken or [place] with skin. Dairy Whole or 2% milk, cream, and half-and-half. Whole or full-fat cream cheese. Whole-fat or sweetened yogurt. Full-fat cheese. Nondairy creamers. Whipped toppings. Processed cheese and cheese spreads. Fats and oils Butter. Stick margarine. Lard. Shortening. Ghee. Bacon fat. Tropical oils, such as coconut, palm kernel, or palm oil. Seasoning and other foods Salted popcorn and pretzels. Onion salt, garlic salt, seasoned salt, table salt, and sea salt. Worcestershire sauce. Tartar sauce. Barbecue sauce. Teriyaki sauce. Soy sauce, including reduced-sodium. Steak sauce. Canned and packaged gravies. Fish sauce. Oyster sauce. Cocktail sauce. Horseradish that you find on the shelf. Ketchup. Mustard. Meat flavorings and tenderizers. Bouillon cubes. Hot sauce and Tabasco sauce. Premade or packaged marinades. Premade or packaged taco seasonings. Relishes. Regular salad dressings. Where to find more information:  National Heart, Lung, and [place]: https://wilson-eaton.com/  American Heart Association: www.heart.org Summary  The DASH eating plan is a healthy eating plan that has been shown to reduce high blood pressure (hypertension). It may also reduce your risk for type 2 diabetes, heart disease, and stroke.  With the DASH eating plan, you should  limit salt (sodium) intake to 2,300 mg a day. If you have hypertension, you may need to reduce your sodium intake to 1,500 mg a day.  When on the DASH eating plan, aim to eat more fresh fruits and vegetables, whole grains, lean proteins, low-fat dairy, and heart-healthy fats.  Work with your health care provider or diet and nutrition specialist (dietitian) to adjust your eating plan to your individual calorie needs. This information is not intended to replace advice given to you by your  health care provider. Make sure you discuss any questions you have with your health care provider. Document Released: 04/30/2011 Document Revised: 04/23/2017 Document Reviewed: 05/04/2016 Elsevier Patient Education  2020 [place] and Cholesterol Restricted Eating Plan Getting too much fat and cholesterol in your diet may cause health problems. Choosing the right foods helps keep your fat and cholesterol at normal levels. This can keep you from getting certain diseases.  What are tips for following this plan? Meal planning  At meals, divide your plate into four equal parts: ? Fill one-half of your plate with vegetables and green salads. ? Fill one-fourth of your plate with whole grains. ? Fill one-fourth of your plate with low-fat (lean) protein foods.  Eat fish that is high in omega-3 fats at least two times a week. This includes mackerel, tuna, sardines, and salmon.  Eat foods that are high in fiber, such as whole grains, beans, apples, broccoli, carrots, peas, and barley. General tips   Work with your doctor to lose weight if you need to.  Avoid: ? Foods with added sugar. ? Fried foods. ? Foods with partially hydrogenated oils.  Limit alcohol intake to no more than 1 drink a day for nonpregnant women and 2 drinks a day for men. One drink equals 12 oz of beer, 5 oz of wine, or 1 oz of hard liquor. Reading food labels  Check food labels for: ? Trans fats. ? Partially hydrogenated oils. ? Saturated fat (g) in each serving. ? Cholesterol (mg) in each serving. ? Fiber (g) in each serving.  Choose foods with healthy fats, such as: ? Monounsaturated fats. ? Polyunsaturated fats. ? Omega-3 fats.  Choose grain products that have whole grains. Look for the word "whole" as the first word in the ingredient list. Cooking  Cook foods using low-fat methods. These include baking, boiling, grilling, and broiling.  Eat more home-cooked foods. Eat at restaurants and buffets  less often.  Avoid cooking using saturated fats, such as butter, cream, palm oil, palm kernel oil, and coconut oil. Recommended foods  Fruits  All fresh, canned (in natural juice), or frozen fruits. Vegetables  Fresh or frozen vegetables (raw, steamed, roasted, or grilled). Green salads. Grains  Whole grains, such as whole wheat or whole grain breads, crackers, cereals, and pasta. Unsweetened oatmeal, bulgur, barley, quinoa, or brown rice. Corn or whole wheat flour tortillas. Meats and other protein foods  Ground beef (85% or leaner), grass-fed beef, or beef trimmed of fat. Skinless chicken or [place]. Ground chicken or [place]. Pork trimmed of fat. All fish and seafood. Egg whites. Dried beans, peas, or lentils. Unsalted nuts or seeds. Unsalted canned beans. Nut butters without added sugar or oil. Dairy  Low-fat or nonfat dairy products, such as skim or 1% milk, 2% or reduced-fat cheeses, low-fat and fat-free ricotta or cottage cheese, or plain low-fat and nonfat yogurt. Fats and oils  Tub margarine without trans fats. Light or reduced-fat mayonnaise and salad dressings. Avocado. Olive, canola,  sesame, or safflower oils. The items listed above may not be a complete list of foods and beverages you can eat. Contact a dietitian for more information. Foods to avoid Fruits  Canned fruit in heavy syrup. Fruit in cream or butter sauce. Fried fruit. Vegetables  Vegetables cooked in cheese, cream, or butter sauce. Fried vegetables. Grains  White bread. White pasta. White rice. Cornbread. Bagels, pastries, and croissants. Crackers and snack foods that contain trans fat and hydrogenated oils. Meats and other protein foods  Fatty cuts of meat. Ribs, chicken wings, bacon, sausage, bologna, salami, chitterlings, fatback, hot dogs, bratwurst, and packaged lunch meats. Liver and organ meats. Whole eggs and egg yolks. Chicken and [place] with skin. Fried meat. Dairy  Whole or 2% milk, cream,  half-and-half, and cream cheese. Whole milk cheeses. Whole-fat or sweetened yogurt. Full-fat cheeses. Nondairy creamers and whipped toppings. Processed cheese, cheese spreads, and cheese curds. Beverages  Alcohol. Sugar-sweetened drinks such as sodas, lemonade, and fruit drinks. Fats and oils  Butter, stick margarine, lard, shortening, ghee, or bacon fat. Coconut, palm kernel, and palm oils. Sweets and desserts  Corn syrup, sugars, honey, and molasses. Candy. Jam and jelly. Syrup. Sweetened cereals. Cookies, pies, cakes, donuts, muffins, and ice cream. The items listed above may not be a complete list of foods and beverages you should avoid. Contact a dietitian for more information. Summary  Choosing the right foods helps keep your fat and cholesterol at normal levels. This can keep you from getting certain diseases.  At meals, fill one-half of your plate with vegetables and green salads.  Eat high-fiber foods, like whole grains, beans, apples, carrots, peas, and barley.  Limit added sugar, saturated fats, alcohol, and fried foods. This information is not intended to replace advice given to you by your health care provider. Make sure you discuss any questions you have with your health care provider. Document Released: 11/10/2011 Document Revised: 01/12/2018 Document Reviewed: 01/26/2017 Elsevier Patient Education  2020 [employer].

## 2019-01-02 NOTE — Progress Notes (Signed)
Careteam: Patient Care Team: Lauree Chandler, NP as PCP - General (Geriatric Medicine) [name], MD as Attending Physician (Gastroenterology)  Advanced Directive information Does Patient Have a Medical Advance Directive?: Yes, Type of Advance Directive: [place];Living will, Does patient want to make changes to medical advance directive?: No - Patient declined  Allergies  Allergen Reactions  . Bee Venom   . Niacin And Related   . Poison Ivy Extract     Chief Complaint  Patient presents with  . Medical Management of Chronic Issues    6 month follow-up and discuss labs (copy printed)   . Hand Problem    Examine right thumb   . Hip Pain    Right hip pain  . Medication Management    Discuss omeprazole   . Quality Metric Gaps    Discuss need for mammogram and pap      HPI: Patient is a 58 y.o. female seen in the office today for routine follow up.   GERD - stable on omeprazole 40mg  daily (needs refill) has been doing the OTC 2 of the 20 mg; followed byGI Dr Ardis Hughs but wont provide refill until OV  Seasonal allergies - stable onallegra prn. 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 She does wear a mask most days.  HTN -stable on losartan 50 mg by mouth daily- took medication today. Also drank a big cup of coffee. Home readings 140/80 Saturday,   Hypothyroidism -stable onlevothyroxine 125 mcg. Last TSH 2.51  Hyperglycemia -A1c up from 5.5 to a1c 5.7%. She has FHx DM (mother and MGM). "needs to go back on diet"  Chronic back pain - followed by Ortho Dr Jacelyn Grip. Last epidural injection given inoct2019. Having NO issues with back currently since she has changed positions.   She takes prn diazepam for dental procedures.  Hyperlipidemia - stable on lipitor 80mg  daily, attempts to maintain a heart health diet. LDL up to 109  Overactive bladder- controlled on oxybutynin  Gets mammogram and pap/pelvic  exam every year by Dr Lisbeth Renshaw, GYN.  Reports she has LEFT hip pain. She walks a lot more at work and every day she will notice it, if she massages area it will go away but then will she increases her activity it will come back.  Thought it was coming from her back and can press down on area and tender.  Also has popping sensation in right thumb. For last 3 weeks. Comes and goes, more often recent. Aching pain. Feels like the muscles are pulling in. And she rubs area and this improves symptoms.  When pain is most intense it is about 9/10. Currently not hurting at all now.  No injuries noted.      Review of Systems:  Review of Systems  Constitutional: Negative for chills, fever and weight loss.  HENT: Negative for tinnitus.   Respiratory: Negative for cough, sputum production and shortness of breath.   Cardiovascular: Negative for chest pain, palpitations and leg swelling.  Gastrointestinal: Negative for abdominal pain, constipation, diarrhea and heartburn.  Genitourinary: Negative for dysuria, frequency and urgency.  Musculoskeletal: Positive for back pain (improved), joint pain and myalgias. Negative for falls.  Skin: Negative.   Neurological: Negative for dizziness and headaches.  Psychiatric/Behavioral: Negative for depression and memory loss. The patient does not have insomnia.     Past Medical History:  Diagnosis Date  . Allergy   . GERD (gastroesophageal reflux disease)   .  Glaucoma   . Headache(784.0)   . Hyperlipidemia   . Hypertension   . Osteoarthritis   . Thyroid disease    hypo  . Type II or unspecified type diabetes mellitus without mention of complication, uncontrolled 09/19/2010   Past Surgical History:  Procedure Laterality Date  . BREAST SURGERY     Lumpectomy- left breast, no lymph node involvement   Social History:   reports that she quit smoking about 18 years ago. She has a 25.00 pack-year smoking history. She has never used smokeless tobacco. She reports  that she does not drink alcohol or use drugs.  Family History  Problem Relation Age of Onset  . Heart disease Mother   . Diabetes Mother   . Hypertension Mother   . Alcohol abuse Other   . Arthritis Other   . Hyperlipidemia Other   . Hypertension Other   . Stroke Other        Female 1st degree relative < 60  . Diabetes Maternal Grandmother   . Colon cancer Neg Hx   . Esophageal cancer Neg Hx   . Stomach cancer Neg Hx   . Rectal cancer Neg Hx     Medications: Patient's Medications  New Prescriptions   No medications on file  Previous Medications   ACETAMINOPHEN (TYLENOL 8 HOUR ARTHRITIS PAIN) 650 MG CR TABLET    Take 1,300 mg by mouth at bedtime as needed.    ATORVASTATIN (LIPITOR) 80 MG TABLET    TAKE 1 TABLET BY MOUTH DAILY AT 6 PM.   DIAZEPAM (VALIUM) 5 MG TABLET    1 tablet by mouth prior to dental appointment, if surgery not prior to cleanings   DICLOFENAC (VOLTAREN) 75 MG EC TABLET    TAKE 1 TABLET BY MOUTH TWICE A DAY   FEXOFENADINE (ALLEGRA) 180 MG TABLET    Take 180 mg by mouth daily as needed for allergies or rhinitis.   LEVOTHYROXINE (SYNTHROID, LEVOTHROID) 125 MCG TABLET    TAKE 1 TABLET (125 MCG TOTAL) BY MOUTH DAILY.   LOSARTAN (COZAAR) 25 MG TABLET    Take two tablets by mouth once daily to make 50mg .   MULTIPLE VITAMINS-MINERALS ([facility] SPECTRAVITE PO)    Take 1 tablet by mouth daily.   NON FORMULARY    Slimfast Keto: take 1 teaspoon daily.   OMEPRAZOLE (PRILOSEC) 40 MG CAPSULE    Be sure to take 1/2 hour before meal.   OXYBUTYNIN (DITROPAN) 5 MG TABLET    Take 5 mg by mouth 2 (two) times daily.   PREVIDENT 5000 BOOSTER PLUS 1.1 % PSTE    Take 1.1 application by mouth daily.   PROBIOTIC PRODUCT (PROBIOTIC DAILY PO)    Take 1 capsule by mouth daily.   PROPYLENE GLYCOL (SYSTANE COMPLETE OP)    Apply to eye 2 (two) times daily. 1 drop in both eyes in the morning and evening  Modified Medications   No medications on file  Discontinued Medications   No medications on  file    Physical Exam:  Vitals:   01/02/19 0908  BP: (!) 152/89  Pulse: 69  Temp: 98.3 F (36.8 C)  TempSrc: Oral  SpO2: 97%  Weight: 214 lb (97.1 kg)  Height: 5\' 9"  (1.753 m)   Body mass index is 31.6 kg/m. Wt Readings from Last 3 Encounters:  01/02/19 214 lb (97.1 kg)  07/04/18 207 lb (93.9 kg)  04/26/18 200 lb (90.7 kg)    Physical Exam Constitutional:  General: She is not in acute distress.    Appearance: She is well-developed. She is not diaphoretic.  HENT:     Head: Normocephalic and atraumatic.     Mouth/Throat:     Pharynx: No oropharyngeal exudate.  Eyes:     Conjunctiva/sclera: Conjunctivae normal.     Pupils: Pupils are equal, round, and reactive to light.  Neck:     Musculoskeletal: Normal range of motion and neck supple.  Cardiovascular:     Rate and Rhythm: Normal rate and regular rhythm.     Heart sounds: Normal heart sounds.  Pulmonary:     Effort: Pulmonary effort is normal. No respiratory distress.     Breath sounds: Normal breath sounds. No stridor. No wheezing or rales.  Chest:     Chest wall: No tenderness.  Abdominal:     General: Bowel sounds are normal.     Palpations: Abdomen is soft.  Musculoskeletal:        General: No swelling.     Left hip: She exhibits tenderness.       Hands:     Right lower leg: No edema.     Left lower leg: No edema.       Legs:  Skin:    General: Skin is warm and dry.  Neurological:     Mental Status: She is alert and oriented to person, place, and time.    Labs reviewed: Basic Metabolic Panel: Recent Labs    06/29/18 0814 12/30/18 0805  NA 141 141  K 4.3 4.4  CL 105 107  CO2 28 27  GLUCOSE 94 113*  BUN 20 22  CREATININE 1.08* 1.03  CALCIUM 9.9 10.0  TSH 2.51 4.02   Liver Function Tests: Recent Labs    06/29/18 0814 12/30/18 0805  AST  --  19  ALT 26 22  BILITOT  --  0.4  PROT  --  6.7   No results for input(s): LIPASE, AMYLASE in the last 8760 hours. No results for  input(s): AMMONIA in the last 8760 hours. CBC: Recent Labs    04/26/18 1347 12/30/18 0805  WBC 12.3* 6.7  NEUTROABS 9,348* 3,551  HGB 16.7* 15.7*  HCT 48.5* 46.7*  MCV 92.2 92.3  PLT 304 227   Lipid Panel: Recent Labs    06/29/18 0814 12/30/18 0805  CHOL 178 180  HDL 58 51  LDLCALC 101* 109*  TRIG 91 108  CHOLHDL 3.1 3.5   TSH: Recent Labs    06/29/18 0814 12/30/18 0805  TSH 2.51 4.02   A1C: Lab Results  Component Value Date   HGBA1C 5.7 (H) 12/30/2018     Assessment/Plan 1. Essential hypertension -elevated, looking back has been on the high side. Encouraged dietary changes but will also will increase losartan to 100 mg daily. -dash diet given - Will have her follow up in 2 weeks  - losartan (COZAAR) 100 MG tablet; Take 1 tablet (100 mg total) by mouth daily.  Dispense: 90 tablet; Refill: 1  2. Mixed hyperlipidemia LDL goal <100, continues on lipitor, encouraged diet changes, continue current medications  3. Hyperglycemia A1c at 5.7, discussed dietary modifications.  4. Acquired hypothyroidism Controlled on current synthroid 125 mcg daily  5. Generalized OA -currently with tenderness to right thumb and left hip -possible bursitis in hip due to increase in walking at work. -to use aleve 1 tablet twice daily BID with food for 7 days only then stop -to use heat followed by muscle rub -to notify  if pain worsens or fails to improve.   6. Gastroesophageal reflux disease, esophagitis presence not specified Controled on omeprazole  - omeprazole (PRILOSEC) 40 MG capsule; Be sure to take 1/2 hour before meal.  Dispense: 30 capsule; Refill: 11  7. Body mass index (BMI) of 31.0-31.9 in adult Noted based on height and weight.   8. Class 1 obesity due to excess calories without serious comorbidity with body mass index (BMI) of 31.0 to 31.9 in adult Noted today, encouraged weight loss with diet and exercise.   Next appt: 2 weeks for bp  And 6 months for  routine follow up with labs prior  Adriana Young K. [place], [facility] [phone]

## 2019-01-16 ENCOUNTER — Ambulatory Visit (INDEPENDENT_AMBULATORY_CARE_PROVIDER_SITE_OTHER): Payer: Managed Care, Other (non HMO) | Admitting: Nurse Practitioner

## 2019-01-16 ENCOUNTER — Encounter: Payer: Self-pay | Admitting: Nurse Practitioner

## 2019-01-16 ENCOUNTER — Other Ambulatory Visit: Payer: Self-pay

## 2019-01-16 VITALS — BP 136/84 | HR 68 | Temp 98.1°F | Ht 69.0 in | Wt 214.6 lb

## 2019-01-16 DIAGNOSIS — I10 Essential (primary) hypertension: Secondary | ICD-10-CM

## 2019-01-16 DIAGNOSIS — Z23 Encounter for immunization: Secondary | ICD-10-CM | POA: Diagnosis not present

## 2019-01-16 NOTE — Progress Notes (Signed)
Careteam: Patient Care Team: Lauree Chandler, NP as PCP - General (Geriatric Medicine) [name], MD as Attending Physician (Gastroenterology)  Advanced Directive information Does Patient Have a Medical Advance Directive?: Yes, Type of Advance Directive: [place];Living will, Does patient want to make changes to medical advance directive?: No - Patient declined  Allergies  Allergen Reactions  . Bee Venom   . Niacin And Related   . Poison Ivy Extract     Chief Complaint  Patient presents with  . Blood Pressure Check    Blood pressure follow-up   . Quality Metric Gaps    Discuss need for Mammogram and Pap   . Immunizations    Flu Vaccine      HPI: Patient is a 58 y.o. female seen in the office today for blood pressure follow up.  Took bp today before she came to office and blood pressure was 108/84. Generally blood pressure 95-128/60-80s with medications.  No side effects noted.  She is currently taking losartan 100 mg daily  No side effects noted from increasing medication.     Review of Systems:  Review of Systems  Constitutional: Negative for chills, fever and malaise/fatigue.  Respiratory: Negative for cough.   Cardiovascular: Negative for chest pain and leg swelling.  Neurological: Negative for dizziness, weakness and headaches.    Past Medical History:  Diagnosis Date  . Allergy   . GERD (gastroesophageal reflux disease)   . Glaucoma   . Headache(784.0)   . Hyperlipidemia   . Hypertension   . Osteoarthritis   . Thyroid disease    hypo  . Type II or unspecified type diabetes mellitus without mention of complication, uncontrolled 09/19/2010   Past Surgical History:  Procedure Laterality Date  . BREAST SURGERY     Lumpectomy- left breast, no lymph node involvement   Social History:   reports that she quit smoking about 18 years ago. She has a 25.00 pack-year smoking history. She has never used smokeless tobacco. She  reports that she does not drink alcohol or use drugs.  Family History  Problem Relation Age of Onset  . Heart disease Mother   . Diabetes Mother   . Hypertension Mother   . Alcohol abuse Other   . Arthritis Other   . Hyperlipidemia Other   . Hypertension Other   . Stroke Other        Female 1st degree relative < 60  . Diabetes Maternal Grandmother   . Colon cancer Neg Hx   . Esophageal cancer Neg Hx   . Stomach cancer Neg Hx   . Rectal cancer Neg Hx     Medications: Patient's Medications  New Prescriptions   No medications on file  Previous Medications   ACETAMINOPHEN (TYLENOL 8 HOUR ARTHRITIS PAIN) 650 MG CR TABLET    Take 1,300 mg by mouth at bedtime as needed.    ATORVASTATIN (LIPITOR) 80 MG TABLET    TAKE 1 TABLET BY MOUTH DAILY AT 6 PM.   DIAZEPAM (VALIUM) 5 MG TABLET    1 tablet by mouth prior to dental appointment, if surgery not prior to cleanings   FEXOFENADINE (ALLEGRA) 180 MG TABLET    Take 180 mg by mouth daily as needed for allergies or rhinitis.   LEVOTHYROXINE (SYNTHROID, LEVOTHROID) 125 MCG TABLET    TAKE 1 TABLET (125 MCG TOTAL) BY MOUTH DAILY.   LOSARTAN (COZAAR) 100 MG TABLET    Take 1 tablet (100 mg total) by  mouth daily.   MULTIPLE VITAMINS-MINERALS ([facility] SPECTRAVITE PO)    Take 1 tablet by mouth daily.   NON FORMULARY    Slimfast Keto: take 1 teaspoon daily.   OMEPRAZOLE (PRILOSEC) 40 MG CAPSULE    Be sure to take 1/2 hour before meal.   OXYBUTYNIN (DITROPAN) 5 MG TABLET    Take 5 mg by mouth 2 (two) times daily.   PROBIOTIC PRODUCT (PROBIOTIC DAILY PO)    Take 1 capsule by mouth daily.   PROPYLENE GLYCOL (SYSTANE COMPLETE OP)    Apply to eye 2 (two) times daily. 1 drop in both eyes in the morning and evening  Modified Medications   No medications on file  Discontinued Medications   No medications on file    Physical Exam:  Vitals:   01/16/19 1506  BP: (!) 146/92  Pulse: 68  Temp: 98.1 F (36.7 C)  TempSrc: Oral  SpO2: 98%  Weight: 214 lb 9.6  oz (97.3 kg)  Height: 5\' 9"  (1.753 m)   Body mass index is 31.69 kg/m. Wt Readings from Last 3 Encounters:  01/16/19 214 lb 9.6 oz (97.3 kg)  01/02/19 214 lb (97.1 kg)  07/04/18 207 lb (93.9 kg)    Physical Exam Constitutional:      Appearance: Normal appearance.  HENT:     Head: Normocephalic and atraumatic.  Neck:     Musculoskeletal: Normal range of motion.  Cardiovascular:     Rate and Rhythm: Normal rate and regular rhythm.     Pulses: Normal pulses.     Heart sounds: Normal heart sounds.  Pulmonary:     Effort: Pulmonary effort is normal.     Breath sounds: Normal breath sounds.  Musculoskeletal:     Right lower leg: No edema.     Left lower leg: No edema.  Neurological:     Mental Status: She is alert.     Labs reviewed: Basic Metabolic Panel: Recent Labs    06/29/18 0814 12/30/18 0805  NA 141 141  K 4.3 4.4  CL 105 107  CO2 28 27  GLUCOSE 94 113*  BUN 20 22  CREATININE 1.08* 1.03  CALCIUM 9.9 10.0  TSH 2.51 4.02   Liver Function Tests: Recent Labs    06/29/18 0814 12/30/18 0805  AST  --  19  ALT 26 22  BILITOT  --  0.4  PROT  --  6.7   No results for input(s): LIPASE, AMYLASE in the last 8760 hours. No results for input(s): AMMONIA in the last 8760 hours. CBC: Recent Labs    04/26/18 1347 12/30/18 0805  WBC 12.3* 6.7  NEUTROABS 9,348* 3,551  HGB 16.7* 15.7*  HCT 48.5* 46.7*  MCV 92.2 92.3  PLT 304 227   Lipid Panel: Recent Labs    06/29/18 0814 12/30/18 0805  CHOL 178 180  HDL 58 51  LDLCALC 101* 109*  TRIG 91 108  CHOLHDL 3.1 3.5   TSH: Recent Labs    06/29/18 0814 12/30/18 0805  TSH 2.51 4.02   A1C: Lab Results  Component Value Date   HGBA1C 5.7 (H) 12/30/2018     Assessment/Plan 1. Need for influenza vaccination - Flu Vaccine QUAD 6+ mos PF IM (Fluarix Quad PF)  2. Essential hypertension -improved on recheck 138/84, also home blood pressure are very well controlled. Will continue current regimen with  dietary modifications as she is tolerating well.   Next appt: 6 months.  Carlos American. Harle Battiest  [facility]  [facility] [phone]

## 2019-01-16 NOTE — Patient Instructions (Signed)
Continue losartan 100 mg by mouth daily with diet modifications.    DASH Eating Plan DASH stands for "Dietary Approaches to Stop Hypertension." The DASH eating plan is a healthy eating plan that has been shown to reduce high blood pressure (hypertension). It may also reduce your risk for type 2 diabetes, heart disease, and stroke. The DASH eating plan may also help with weight loss. What are tips for following this plan?  General guidelines  Avoid eating more than 2,300 mg (milligrams) of salt (sodium) a day. If you have hypertension, you may need to reduce your sodium intake to 1,500 mg a day.  Limit alcohol intake to no more than 1 drink a day for nonpregnant women and 2 drinks a day for men. One drink equals 12 oz of beer, 5 oz of wine, or 1 oz of hard liquor.  Work with your health care provider to maintain a healthy body weight or to lose weight. Ask what an ideal weight is for you.  Get at least 30 minutes of exercise that causes your heart to beat faster (aerobic exercise) most days of the week. Activities may include walking, swimming, or biking.  Work with your health care provider or diet and nutrition specialist (dietitian) to adjust your eating plan to your individual calorie needs. Reading food labels   Check food labels for the amount of sodium per serving. Choose foods with less than 5 percent of the Daily Value of sodium. Generally, foods with less than 300 mg of sodium per serving fit into this eating plan.  To find whole grains, look for the word "whole" as the first word in the ingredient list. Shopping  Buy products labeled as "low-sodium" or "no salt added."  Buy fresh foods. Avoid canned foods and premade or frozen meals. Cooking  Avoid adding salt when cooking. Use salt-free seasonings or herbs instead of table salt or sea salt. Check with your health care provider or pharmacist before using salt substitutes.  Do not fry foods. Cook foods using healthy methods  such as baking, boiling, grilling, and broiling instead.  Cook with heart-healthy oils, such as olive, canola, soybean, or sunflower oil. Meal planning  Eat a balanced diet that includes: ? 5 or more servings of fruits and vegetables each day. At each meal, try to fill half of your plate with fruits and vegetables. ? Up to 6-8 servings of whole grains each day. ? Less than 6 oz of lean meat, poultry, or fish each day. A 3-oz serving of meat is about the same size as a deck of cards. One egg equals 1 oz. ? 2 servings of low-fat dairy each day. ? A serving of nuts, seeds, or beans 5 times each week. ? Heart-healthy fats. Healthy fats called Omega-3 fatty acids are found in foods such as flaxseeds and coldwater fish, like sardines, salmon, and mackerel.  Limit how much you eat of the following: ? Canned or prepackaged foods. ? Food that is high in trans fat, such as fried foods. ? Food that is high in saturated fat, such as fatty meat. ? Sweets, desserts, sugary drinks, and other foods with added sugar. ? Full-fat dairy products.  Do not salt foods before eating.  Try to eat at least 2 vegetarian meals each week.  Eat more home-cooked food and less restaurant, buffet, and fast food.  When eating at a restaurant, ask that your food be prepared with less salt or no salt, if possible. What foods are recommended? The  items listed may not be a complete list. Talk with your dietitian about what dietary choices are best for you. Grains Whole-grain or whole-wheat bread. Whole-grain or whole-wheat pasta. Brown rice. [place] Morrow. Bulgur. Whole-grain and low-sodium cereals. Pita bread. Low-fat, low-sodium crackers. Whole-wheat flour tortillas. Vegetables Fresh or frozen vegetables (raw, steamed, roasted, or grilled). Low-sodium or reduced-sodium tomato and vegetable juice. Low-sodium or reduced-sodium tomato sauce and tomato paste. Low-sodium or reduced-sodium canned vegetables. Fruits All  fresh, dried, or frozen fruit. Canned fruit in natural juice (without added sugar). Meat and other protein foods Skinless chicken or [place]. Ground chicken or [place]. Pork with fat trimmed off. Fish and seafood. Egg whites. Dried beans, peas, or lentils. Unsalted nuts, nut butters, and seeds. Unsalted canned beans. Lean cuts of beef with fat trimmed off. Low-sodium, lean deli meat. Dairy Low-fat (1%) or fat-free (skim) milk. Fat-free, low-fat, or reduced-fat cheeses. Nonfat, low-sodium ricotta or cottage cheese. Low-fat or nonfat yogurt. Low-fat, low-sodium cheese. Fats and oils Soft margarine without trans fats. Vegetable oil. Low-fat, reduced-fat, or light mayonnaise and salad dressings (reduced-sodium). Canola, safflower, olive, soybean, and sunflower oils. Avocado. Seasoning and other foods Herbs. Spices. Seasoning mixes without salt. Unsalted popcorn and pretzels. Fat-free sweets. What foods are not recommended? The items listed may not be a complete list. Talk with your dietitian about what dietary choices are best for you. Grains Baked goods made with fat, such as croissants, muffins, or some breads. Dry pasta or rice meal packs. Vegetables Creamed or fried vegetables. Vegetables in a cheese sauce. Regular canned vegetables (not low-sodium or reduced-sodium). Regular canned tomato sauce and paste (not low-sodium or reduced-sodium). Regular tomato and vegetable juice (not low-sodium or reduced-sodium). [name]. Olives. Fruits Canned fruit in a light or heavy syrup. Fried fruit. Fruit in cream or butter sauce. Meat and other protein foods Fatty cuts of meat. Ribs. Fried meat. [name]. Sausage. Bologna and other processed lunch meats. Salami. Fatback. Hotdogs. Bratwurst. Salted nuts and seeds. Canned beans with added salt. Canned or smoked fish. Whole eggs or egg yolks. Chicken or [place] with skin. Dairy Whole or 2% milk, cream, and half-and-half. Whole or full-fat cream cheese. Whole-fat or  sweetened yogurt. Full-fat cheese. Nondairy creamers. Whipped toppings. Processed cheese and cheese spreads. Fats and oils Butter. Stick margarine. Lard. Shortening. Ghee. Bacon fat. Tropical oils, such as coconut, palm kernel, or palm oil. Seasoning and other foods Salted popcorn and pretzels. Onion salt, garlic salt, seasoned salt, table salt, and sea salt. Worcestershire sauce. Tartar sauce. Barbecue sauce. Teriyaki sauce. Soy sauce, including reduced-sodium. Steak sauce. Canned and packaged gravies. Fish sauce. Oyster sauce. Cocktail sauce. Horseradish that you find on the shelf. Ketchup. Mustard. Meat flavorings and tenderizers. Bouillon cubes. Hot sauce and Tabasco sauce. Premade or packaged marinades. Premade or packaged taco seasonings. Relishes. Regular salad dressings. Where to find more information:  National Heart, Lung, and [place]: https://wilson-eaton.com/  American Heart Association: www.heart.org Summary  The DASH eating plan is a healthy eating plan that has been shown to reduce high blood pressure (hypertension). It may also reduce your risk for type 2 diabetes, heart disease, and stroke.  With the DASH eating plan, you should limit salt (sodium) intake to 2,300 mg a day. If you have hypertension, you may need to reduce your sodium intake to 1,500 mg a day.  When on the DASH eating plan, aim to eat more fresh fruits and vegetables, whole grains, lean proteins, low-fat dairy, and heart-healthy fats.  Work with your health care provider  or diet and nutrition specialist (dietitian) to adjust your eating plan to your individual calorie needs. This information is not intended to replace advice given to you by your health care provider. Make sure you discuss any questions you have with your health care provider. Document Released: 04/30/2011 Document Revised: 04/23/2017 Document Reviewed: 05/04/2016 Elsevier Patient Education  2020 [employer].

## 2019-02-02 ENCOUNTER — Other Ambulatory Visit: Payer: Self-pay | Admitting: Nurse Practitioner

## 2019-05-25 LAB — HM MAMMOGRAPHY

## 2019-05-30 ENCOUNTER — Encounter: Payer: Self-pay | Admitting: *Deleted

## 2019-06-22 ENCOUNTER — Other Ambulatory Visit: Payer: Self-pay

## 2019-06-22 ENCOUNTER — Other Ambulatory Visit: Payer: Managed Care, Other (non HMO)

## 2019-06-22 DIAGNOSIS — E782 Mixed hyperlipidemia: Secondary | ICD-10-CM

## 2019-06-22 DIAGNOSIS — R739 Hyperglycemia, unspecified: Secondary | ICD-10-CM

## 2019-06-22 DIAGNOSIS — I10 Essential (primary) hypertension: Secondary | ICD-10-CM

## 2019-06-23 LAB — CBC WITH DIFFERENTIAL/PLATELET
Absolute Monocytes: 391 cells/uL (ref 200–950)
Basophils Absolute: 92 cells/uL (ref 0–200)
Basophils Relative: 1.3 %
Eosinophils Absolute: 149 cells/uL (ref 15–500)
Eosinophils Relative: 2.1 %
HCT: 47.9 % — ABNORMAL HIGH (ref 35.0–45.0)
Hemoglobin: 16.1 g/dL — ABNORMAL HIGH (ref 11.7–15.5)
Lymphs Abs: 2279 cells/uL (ref 850–3900)
MCH: 30.6 pg (ref 27.0–33.0)
MCHC: 33.6 g/dL (ref 32.0–36.0)
MCV: 91.1 fL (ref 80.0–100.0)
MPV: 10.6 fL (ref 7.5–12.5)
Monocytes Relative: 5.5 %
Neutro Abs: 4189 cells/uL (ref 1500–7800)
Neutrophils Relative %: 59 %
Platelets: 229 10*3/uL (ref 140–400)
RBC: 5.26 10*6/uL — ABNORMAL HIGH (ref 3.80–5.10)
RDW: 12.4 % (ref 11.0–15.0)
Total Lymphocyte: 32.1 %
WBC: 7.1 10*3/uL (ref 3.8–10.8)

## 2019-06-23 LAB — COMPLETE METABOLIC PANEL WITH GFR
AG Ratio: 1.9 (calc) (ref 1.0–2.5)
ALT: 23 U/L (ref 6–29)
AST: 19 U/L (ref 10–35)
Albumin: 4.6 g/dL (ref 3.6–5.1)
Alkaline phosphatase (APISO): 70 U/L (ref 37–153)
BUN/Creatinine Ratio: 25 (calc) — ABNORMAL HIGH (ref 6–22)
BUN: 27 mg/dL — ABNORMAL HIGH (ref 7–25)
CO2: 27 mmol/L (ref 20–32)
Calcium: 9.9 mg/dL (ref 8.6–10.4)
Chloride: 105 mmol/L (ref 98–110)
Creat: 1.09 mg/dL — ABNORMAL HIGH (ref 0.50–1.05)
GFR, Est African American: 64 mL/min/{1.73_m2} (ref >=60)
GFR, Est Non African American: 56 mL/min/{1.73_m2} — ABNORMAL LOW (ref >=60)
Globulin: 2.4 g/dL (calc) (ref 1.9–3.7)
Glucose, Bld: 115 mg/dL — ABNORMAL HIGH (ref 65–99)
Potassium: 4.3 mmol/L (ref 3.5–5.3)
Sodium: 141 mmol/L (ref 135–146)
Total Bilirubin: 0.5 mg/dL (ref 0.2–1.2)
Total Protein: 7 g/dL (ref 6.1–8.1)

## 2019-06-23 LAB — HEMOGLOBIN A1C
Hgb A1c MFr Bld: 5.9 % of total Hgb — ABNORMAL HIGH (ref <5.7)
Mean Plasma Glucose: 123 (calc)
eAG (mmol/L): 6.8 (calc)

## 2019-06-23 LAB — LIPID PANEL
Cholesterol: 200 mg/dL — ABNORMAL HIGH (ref <200)
HDL: 56 mg/dL (ref >=50)
LDL Cholesterol (Calc): 121 mg/dL (calc) — ABNORMAL HIGH
Non-HDL Cholesterol (Calc): 144 mg/dL (calc) — ABNORMAL HIGH (ref <130)
Total CHOL/HDL Ratio: 3.6 (calc) (ref <5.0)
Triglycerides: 120 mg/dL (ref <150)

## 2019-07-02 ENCOUNTER — Other Ambulatory Visit: Payer: Self-pay | Admitting: Nurse Practitioner

## 2019-07-02 DIAGNOSIS — I10 Essential (primary) hypertension: Secondary | ICD-10-CM

## 2019-07-05 ENCOUNTER — Encounter: Payer: Self-pay | Admitting: Nurse Practitioner

## 2019-07-05 ENCOUNTER — Ambulatory Visit (INDEPENDENT_AMBULATORY_CARE_PROVIDER_SITE_OTHER): Payer: Managed Care, Other (non HMO) | Admitting: Nurse Practitioner

## 2019-07-05 ENCOUNTER — Other Ambulatory Visit: Payer: Self-pay

## 2019-07-05 VITALS — BP 158/100 | HR 78 | Temp 96.9°F | Ht 69.0 in | Wt 219.8 lb

## 2019-07-05 DIAGNOSIS — M5442 Lumbago with sciatica, left side: Secondary | ICD-10-CM | POA: Diagnosis not present

## 2019-07-05 DIAGNOSIS — I10 Essential (primary) hypertension: Secondary | ICD-10-CM

## 2019-07-05 DIAGNOSIS — Z6832 Body mass index (BMI) 32.0-32.9, adult: Secondary | ICD-10-CM

## 2019-07-05 DIAGNOSIS — E6609 Other obesity due to excess calories: Secondary | ICD-10-CM

## 2019-07-05 DIAGNOSIS — G8929 Other chronic pain: Secondary | ICD-10-CM

## 2019-07-05 DIAGNOSIS — E782 Mixed hyperlipidemia: Secondary | ICD-10-CM | POA: Diagnosis not present

## 2019-07-05 DIAGNOSIS — R739 Hyperglycemia, unspecified: Secondary | ICD-10-CM | POA: Diagnosis not present

## 2019-07-05 DIAGNOSIS — E039 Hypothyroidism, unspecified: Secondary | ICD-10-CM

## 2019-07-05 DIAGNOSIS — K219 Gastro-esophageal reflux disease without esophagitis: Secondary | ICD-10-CM

## 2019-07-05 MED ORDER — ROSUVASTATIN CALCIUM 40 MG PO TABS: 40.0000 mg | ORAL_TABLET | Freq: Every day | ORAL | 3 refills | Status: DC

## 2019-07-05 MED ORDER — GABAPENTIN 100 MG PO CAPS: 100.0000 mg | ORAL_CAPSULE | Freq: Three times a day (TID) | ORAL | 3 refills | Status: DC | PRN

## 2019-07-05 NOTE — Patient Instructions (Addendum)
Important to make changes to diet to help bring blood pressure down Start today :)  To start gabapentin 100 mg three times daily as needed for pain and MAKE FOLLOW UP WITH ORTHOPEDIC  Follow up in 6 weeks on blood pressure GOAL <140/90  On next refill will get crestor 40 mg daily, if this is too costly to let us know   Important to maintain proper hydration and to avoid NSAIDS (Aleve, Advil, Motrin, Ibuprofen)   Can use tylenol 500 mg 1-2 tablets every 8 hours as needed for pain as well.   DASH Eating Plan DASH stands for "Dietary Approaches to Stop Hypertension." The DASH eating plan is a healthy eating plan that has been shown to reduce high blood pressure (hypertension). It may also reduce your risk for type 2 diabetes, heart disease, and stroke. The DASH eating plan may also help with weight loss. What are tips for following this plan?  General guidelines  Avoid eating more than 2,300 mg (milligrams) of salt (sodium) a day. If you have hypertension, you may need to reduce your sodium intake to 1,500 mg a day.  Limit alcohol intake to no more than 1 drink a day for nonpregnant women and 2 drinks a day for men. One drink equals 12 oz of beer, 5 oz of wine, or 1 oz of hard liquor.  Work with your health care provider to maintain a healthy body weight or to lose weight. Ask what an ideal weight is for you.  Get at least 30 minutes of exercise that causes your heart to beat faster (aerobic exercise) most days of the week. Activities may include walking, swimming, or biking.  Work with your health care provider or diet and nutrition specialist (dietitian) to adjust your eating plan to your individual calorie needs. Reading food labels   Check food labels for the amount of sodium per serving. Choose foods with less than 5 percent of the Daily Value of sodium. Generally, foods with less than 300 mg of sodium per serving fit into this eating plan.  To find whole grains, look for the  word "whole" as the first word in the ingredient list. Shopping  Buy products labeled as "low-sodium" or "no salt added."  Buy fresh foods. Avoid canned foods and premade or frozen meals. Cooking  Avoid adding salt when cooking. Use salt-free seasonings or herbs instead of table salt or sea salt. Check with your health care provider or pharmacist before using salt substitutes.  Do not fry foods. Cook foods using healthy methods such as baking, boiling, grilling, and broiling instead.  Cook with heart-healthy oils, such as olive, canola, soybean, or sunflower oil. Meal planning  Eat a balanced diet that includes: ? 5 or more servings of fruits and vegetables each day. At each meal, try to fill half of your plate with fruits and vegetables. ? Up to 6-8 servings of whole grains each day. ? Less than 6 oz of lean meat, poultry, or fish each day. A 3-oz serving of meat is about the same size as a deck of cards. One egg equals 1 oz. ? 2 servings of low-fat dairy each day. ? A serving of nuts, seeds, or beans 5 times each week. ? Heart-healthy fats. Healthy fats called Omega-3 fatty acids are found in foods such as flaxseeds and coldwater fish, like sardines, salmon, and mackerel.  Limit how much you eat of the following: ? Canned or prepackaged foods. ? Food that is high in trans fat,  such as fried foods. ? Food that is high in saturated fat, such as fatty meat. ? Sweets, desserts, sugary drinks, and other foods with added sugar. ? Full-fat dairy products.  Do not salt foods before eating.  Try to eat at least 2 vegetarian meals each week.  Eat more home-cooked food and less restaurant, buffet, and fast food.  When eating at a restaurant, ask that your food be prepared with less salt or no salt, if possible. What foods are recommended? The items listed may not be a complete list. Talk with your dietitian about what dietary choices are best for you. Grains Whole-grain or whole-wheat  bread. Whole-grain or whole-wheat pasta. Brown rice. [place] Morrow. Bulgur. Whole-grain and low-sodium cereals. Pita bread. Low-fat, low-sodium crackers. Whole-wheat flour tortillas. Vegetables Fresh or frozen vegetables (raw, steamed, roasted, or grilled). Low-sodium or reduced-sodium tomato and vegetable juice. Low-sodium or reduced-sodium tomato sauce and tomato paste. Low-sodium or reduced-sodium canned vegetables. Fruits All fresh, dried, or frozen fruit. Canned fruit in natural juice (without added sugar). Meat and other protein foods Skinless chicken or [place]. Ground chicken or [place]. Pork with fat trimmed off. Fish and seafood. Egg whites. Dried beans, peas, or lentils. Unsalted nuts, nut butters, and seeds. Unsalted canned beans. Lean cuts of beef with fat trimmed off. Low-sodium, lean deli meat. Dairy Low-fat (1%) or fat-free (skim) milk. Fat-free, low-fat, or reduced-fat cheeses. Nonfat, low-sodium ricotta or cottage cheese. Low-fat or nonfat yogurt. Low-fat, low-sodium cheese. Fats and oils Soft margarine without trans fats. Vegetable oil. Low-fat, reduced-fat, or light mayonnaise and salad dressings (reduced-sodium). Canola, safflower, olive, soybean, and sunflower oils. Avocado. Seasoning and other foods Herbs. Spices. Seasoning mixes without salt. Unsalted popcorn and pretzels. Fat-free sweets. What foods are not recommended? The items listed may not be a complete list. Talk with your dietitian about what dietary choices are best for you. Grains Baked goods made with fat, such as croissants, muffins, or some breads. Dry pasta or rice meal packs. Vegetables Creamed or fried vegetables. Vegetables in a cheese sauce. Regular canned vegetables (not low-sodium or reduced-sodium). Regular canned tomato sauce and paste (not low-sodium or reduced-sodium). Regular tomato and vegetable juice (not low-sodium or reduced-sodium). [name]. Olives. Fruits Canned fruit in a light or heavy  syrup. Fried fruit. Fruit in cream or butter sauce. Meat and other protein foods Fatty cuts of meat. Ribs. Fried meat. [name]. Sausage. Bologna and other processed lunch meats. Salami. Fatback. Hotdogs. Bratwurst. Salted nuts and seeds. Canned beans with added salt. Canned or smoked fish. Whole eggs or egg yolks. Chicken or [place] with skin. Dairy Whole or 2% milk, cream, and half-and-half. Whole or full-fat cream cheese. Whole-fat or sweetened yogurt. Full-fat cheese. Nondairy creamers. Whipped toppings. Processed cheese and cheese spreads. Fats and oils Butter. Stick margarine. Lard. Shortening. Ghee. Bacon fat. Tropical oils, such as coconut, palm kernel, or palm oil. Seasoning and other foods Salted popcorn and pretzels. Onion salt, garlic salt, seasoned salt, table salt, and sea salt. Worcestershire sauce. Tartar sauce. Barbecue sauce. Teriyaki sauce. Soy sauce, including reduced-sodium. Steak sauce. Canned and packaged gravies. Fish sauce. Oyster sauce. Cocktail sauce. Horseradish that you find on the shelf. Ketchup. Mustard. Meat flavorings and tenderizers. Bouillon cubes. Hot sauce and Tabasco sauce. Premade or packaged marinades. Premade or packaged taco seasonings. Relishes. Regular salad dressings. Where to find more information:  National Heart, Lung, and [place]: https://wilson-eaton.com/  American Heart Association: www.heart.org Summary  The DASH eating plan is a healthy eating plan that has been  shown to reduce high blood pressure (hypertension). It may also reduce your risk for type 2 diabetes, heart disease, and stroke.  With the DASH eating plan, you should limit salt (sodium) intake to 2,300 mg a day. If you have hypertension, you may need to reduce your sodium intake to 1,500 mg a day.  When on the DASH eating plan, aim to eat more fresh fruits and vegetables, whole grains, lean proteins, low-fat dairy, and heart-healthy fats.  Work with your health care provider or diet and  nutrition specialist (dietitian) to adjust your eating plan to your individual calorie needs. This information is not intended to replace advice given to you by your health care provider. Make sure you discuss any questions you have with your health care provider. Document Revised: 04/23/2017 Document Reviewed: 05/04/2016 Elsevier Patient Education  2020 [employer].

## 2019-07-05 NOTE — Progress Notes (Signed)
Careteam: Patient Care Team: Lauree Chandler, NP as PCP - General (Geriatric Medicine) [name], MD as Attending Physician (Gastroenterology)  Advanced Directive information    Allergies  Allergen Reactions  . Bee Venom   . Niacin And Related   . Poison Ivy Extract     Chief Complaint  Patient presents with  . Medical Management of Chronic Issues    6 month follow-up and review labs (patient viewed via mychart)  . Hip Pain    Left hip pain when walking   . Blood Pressure Check    Patient with elevated B/P, possibly related to eye issues     HPI: Patient is a 59 y.o. female for routine follow up.   GERD- controlled on omprazole.   OAB- stable on oxybutynin  Seasonal allergies-no recent allergies, continues on allergra.   htn- continues on losartan. Taking at home 160/102- up since eye problems. More sodium in diet. Stress eating.   Not going back to the gym due to [place], recently got treatmill.  Plans to stop eating out.   Chronic back pain- overall no back pain but  Reports ongoing hip pain on the left- when she is walking it starts in the buttock and shoots down leg. She also has chronic pain in the low back with injection by Dr Jacelyn Grip. Using aspercream.  Shooting pain when walking.    Hyperglycemia- poor diet, plans to work on   Hyperlipidemia- LDL up, would like to work on diet before starting medication   Wearing a patch over left eye. 15 years ago she had a stye in her eye and was placed on drops that she was allergic to and after that her eye has been "messed up"  She has plans to go to eye doctor in 1 month but she spoke with him and he told her to keep it covered due to significant light sensitivity- needing a eye replacement due to severity of scar tissue.  Review of Systems:  Review of Systems  Constitutional: Negative for chills, fever and weight loss.  HENT: Negative for tinnitus.   Eyes:       Significant light sensitivity    Respiratory: Negative for cough, sputum production and shortness of breath.   Cardiovascular: Negative for chest pain, palpitations and leg swelling.  Gastrointestinal: Negative for abdominal pain, constipation, diarrhea and heartburn.  Genitourinary: Negative for dysuria, frequency and urgency.  Musculoskeletal: Positive for back pain. Negative for falls, joint pain and myalgias.  Skin: Negative.   Neurological: Negative for dizziness, focal weakness, weakness and headaches.  Psychiatric/Behavioral: Negative for depression and memory loss. The patient does not have insomnia.     Past Medical History:  Diagnosis Date  . Allergy   . GERD (gastroesophageal reflux disease)   . Glaucoma   . Headache(784.0)   . Hyperlipidemia   . Hypertension   . Osteoarthritis   . Thyroid disease    hypo  . Type II or unspecified type diabetes mellitus without mention of complication, uncontrolled 09/19/2010   Past Surgical History:  Procedure Laterality Date  . BREAST SURGERY     Lumpectomy- left breast, no lymph node involvement   Social History:   reports that she quit smoking about 19 years ago. Her smoking use included cigarettes. She has a 25.00 pack-year smoking history. She has never used smokeless tobacco. She reports that she does not drink alcohol or use drugs.  Family History  Problem Relation Age of Onset  . Heart  disease Mother   . Diabetes Mother   . Hypertension Mother   . Alcohol abuse Other   . Arthritis Other   . Hyperlipidemia Other   . Hypertension Other   . Stroke Other        Female 1st degree relative < 60  . Diabetes Maternal Grandmother   . Colon cancer Neg Hx   . Esophageal cancer Neg Hx   . Stomach cancer Neg Hx   . Rectal cancer Neg Hx     Medications: Patient's Medications  New Prescriptions   No medications on file  Previous Medications   ACETAMINOPHEN (TYLENOL 8 HOUR ARTHRITIS PAIN) 650 MG CR TABLET    Take 1,300 mg by mouth 2 (two) times daily.     ATORVASTATIN (LIPITOR) 80 MG TABLET    TAKE 1 TABLET BY MOUTH DAILY AT 6 PM.   DIAZEPAM (VALIUM) 5 MG TABLET    1 tablet by mouth prior to dental appointment, if surgery not prior to cleanings   FEXOFENADINE (ALLEGRA) 180 MG TABLET    Take 180 mg by mouth daily as needed for allergies or rhinitis.   IBUPROFEN-ACETAMINOPHEN (ADVIL DUAL ACTION) 125-250 MG TABS    Take 2 tablets by mouth 2 (two) times daily.   LEVOTHYROXINE (SYNTHROID) 125 MCG TABLET    TAKE 1 TABLET BY MOUTH EVERY DAY   LOSARTAN (COZAAR) 100 MG TABLET    TAKE 1 TABLET BY MOUTH EVERY DAY   MULTIPLE VITAMINS-MINERALS ([facility] SPECTRAVITE PO)    Take 1 tablet by mouth daily.   OMEPRAZOLE (PRILOSEC) 40 MG CAPSULE    Be sure to take 1/2 hour before meal.   OXYBUTYNIN (DITROPAN) 5 MG TABLET    Take 5 mg by mouth 2 (two) times daily.   PROBIOTIC PRODUCT (PROBIOTIC DAILY PO)    Take 1 capsule by mouth daily.   PROPYLENE GLYCOL (SYSTANE COMPLETE OP)    Apply to eye 2 (two) times daily. 1 drop in both eyes in the morning and evening  Modified Medications   No medications on file  Discontinued Medications   NON FORMULARY    Slimfast Keto: take 1 teaspoon daily.    Physical Exam:  Vitals:   07/05/19 0825  BP: (!) 156/100  Pulse: 78  Temp: (!) 96.9 F (36.1 C)  TempSrc: Temporal  SpO2: 98%  Weight: 219 lb 12.8 oz (99.7 kg)  Height: 5\' 9"  (1.753 m)   Body mass index is 32.46 kg/m. Wt Readings from Last 3 Encounters:  07/05/19 219 lb 12.8 oz (99.7 kg)  01/16/19 214 lb 9.6 oz (97.3 kg)  01/02/19 214 lb (97.1 kg)    Physical Exam Constitutional:      General: She is not in acute distress.    Appearance: She is well-developed. She is not diaphoretic.  HENT:     Head: Normocephalic and atraumatic.     Mouth/Throat:     Pharynx: No oropharyngeal exudate.  Eyes:     Conjunctiva/sclera: Conjunctivae normal.     Pupils: Pupils are equal, round, and reactive to light.  Cardiovascular:     Rate and Rhythm: Normal rate and regular  rhythm.     Heart sounds: Normal heart sounds.  Pulmonary:     Effort: Pulmonary effort is normal.     Breath sounds: Normal breath sounds.  Abdominal:     General: Bowel sounds are normal.     Palpations: Abdomen is soft.  Musculoskeletal:        General: No tenderness.  Normal range of motion.     Cervical back: Normal range of motion and neck supple.     Right lower leg: No edema.     Left lower leg: No edema.  Skin:    General: Skin is warm and dry.  Neurological:     Mental Status: She is alert and oriented to person, place, and time.     Sensory: Sensation is intact. No sensory deficit.     Motor: Motor function is intact. No weakness.     Labs reviewed: Basic Metabolic Panel: Recent Labs    12/30/18 0805 06/22/19 0805  NA 141 141  K 4.4 4.3  CL 107 105  CO2 27 27  GLUCOSE 113* 115*  BUN 22 27*  CREATININE 1.03 1.09*  CALCIUM 10.0 9.9  TSH 4.02  --    Liver Function Tests: Recent Labs    12/30/18 0805 06/22/19 0805  AST 19 19  ALT 22 23  BILITOT 0.4 0.5  PROT 6.7 7.0   No results for input(s): LIPASE, AMYLASE in the last 8760 hours. No results for input(s): AMMONIA in the last 8760 hours. CBC: Recent Labs    12/30/18 0805 06/22/19 0805  WBC 6.7 7.1  NEUTROABS 3,551 4,189  HGB 15.7* 16.1*  HCT 46.7* 47.9*  MCV 92.3 91.1  PLT 227 229   Lipid Panel: Recent Labs    12/30/18 0805 06/22/19 0805  CHOL 180 200*  HDL 51 56  LDLCALC 109* 121*  TRIG 108 120  CHOLHDL 3.5 3.6   TSH: Recent Labs    12/30/18 0805  TSH 4.02   A1C: Lab Results  Component Value Date   HGBA1C 5.9 (H) 06/22/2019     Assessment/Plan 1. Mixed hyperlipidemia -worsening LDL and poor diet, will change to crestor with next refill but she plans to make dietary modifications. - rosuvastatin (CRESTOR) 40 MG tablet; Take 1 tablet (40 mg total) by mouth daily.  Dispense: 90 tablet; Refill: 3  2. Essential hypertension -elevated today, poor diet with issues with her  eye. Generally BP elevated more in office then at home. She is to work on dietary modifications and monitor at home. Goal bp <140/90. If blood pressure worsens at home to notify office, otherwise plans to work on dietary modifications and follow up in 6 weeks.   3. Hyperglycemia -A1c slightly elevated, plans to work on diet.   4. Chronic left-sided low back pain with left-sided sciatica Pain possible coming from back and consistent with sciatica, advised to follow up with orthopedic who she has been following with for chronic back pain. Will start gabapentin to see if this helps with sciatica.  - gabapentin (NEURONTIN) 100 MG capsule; Take 1 capsule (100 mg total) by mouth 3 (three) times daily as needed.  Dispense: 90 capsule; Refill: 3  5. Acquired hypothyroidism -TSH at goal, continues on synthroid 125 mcg  6. Gastroesophageal reflux disease without esophagitis -controlled on omeprazole.  7. Class 1 obesity due to excess calories with serious comorbidity and body mass index (BMI) of 32.0 to 32.9 in adult -discussed changes in lifestyle, diet and exercise - Amb Ref to Medical Weight Management for further management and follow up.    Next appt: 6 weeks to follow up blood pressure. [employer]. [place] [phone]

## 2019-07-25 ENCOUNTER — Other Ambulatory Visit: Payer: Self-pay | Admitting: Nurse Practitioner

## 2019-07-25 NOTE — Telephone Encounter (Signed)
Should pt be taking atorvastatin or crestor?

## 2019-08-09 ENCOUNTER — Encounter: Payer: Self-pay | Admitting: Nurse Practitioner

## 2019-08-09 ENCOUNTER — Ambulatory Visit (INDEPENDENT_AMBULATORY_CARE_PROVIDER_SITE_OTHER): Payer: Managed Care, Other (non HMO) | Admitting: Nurse Practitioner

## 2019-08-09 ENCOUNTER — Other Ambulatory Visit: Payer: Self-pay

## 2019-08-09 VITALS — BP 146/84 | HR 74 | Temp 96.6°F | Wt 220.0 lb

## 2019-08-09 DIAGNOSIS — I10 Essential (primary) hypertension: Secondary | ICD-10-CM | POA: Diagnosis not present

## 2019-08-09 DIAGNOSIS — M545 Low back pain, unspecified: Secondary | ICD-10-CM

## 2019-08-09 MED ORDER — HYDROCHLOROTHIAZIDE 25 MG PO TABS: 25.0000 mg | ORAL_TABLET | Freq: Every day | ORAL | 0 refills | Status: DC

## 2019-08-09 NOTE — Patient Instructions (Signed)
To start HCTZ (hydrochlorothiazide) 25 mg daily in the morning for blood pressure  Continue to take blood pressure after you take medication  Notify office for any adverse reaction or if unable to tolerate.     DASH Eating Plan DASH stands for "Dietary Approaches to Stop Hypertension." The DASH eating plan is a healthy eating plan that has been shown to reduce high blood pressure (hypertension). It may also reduce your risk for type 2 diabetes, heart disease, and stroke. The DASH eating plan may also help with weight loss. What are tips for following this plan?  General guidelines  Avoid eating more than 2,300 mg (milligrams) of salt (sodium) a day. If you have hypertension, you may need to reduce your sodium intake to 1,500 mg a day.  Limit alcohol intake to no more than 1 drink a day for nonpregnant women and 2 drinks a day for men. One drink equals 12 oz of beer, 5 oz of wine, or 1 oz of hard liquor.  Work with your health care provider to maintain a healthy body weight or to lose weight. Ask what an ideal weight is for you.  Get at least 30 minutes of exercise that causes your heart to beat faster (aerobic exercise) most days of the week. Activities may include walking, swimming, or biking.  Work with your health care provider or diet and nutrition specialist (dietitian) to adjust your eating plan to your individual calorie needs. Reading food labels   Check food labels for the amount of sodium per serving. Choose foods with less than 5 percent of the Daily Value of sodium. Generally, foods with less than 300 mg of sodium per serving fit into this eating plan.  To find whole grains, look for the word "whole" as the first word in the ingredient list. Shopping  Buy products labeled as "low-sodium" or "no salt added."  Buy fresh foods. Avoid canned foods and premade or frozen meals. Cooking  Avoid adding salt when cooking. Use salt-free seasonings or herbs instead of table salt  or sea salt. Check with your health care provider or pharmacist before using salt substitutes.  Do not fry foods. Cook foods using healthy methods such as baking, boiling, grilling, and broiling instead.  Cook with heart-healthy oils, such as olive, canola, soybean, or sunflower oil. Meal planning  Eat a balanced diet that includes: ? 5 or more servings of fruits and vegetables each day. At each meal, try to fill half of your plate with fruits and vegetables. ? Up to 6-8 servings of whole grains each day. ? Less than 6 oz of lean meat, poultry, or fish each day. A 3-oz serving of meat is about the same size as a deck of cards. One egg equals 1 oz. ? 2 servings of low-fat dairy each day. ? A serving of nuts, seeds, or beans 5 times each week. ? Heart-healthy fats. Healthy fats called Omega-3 fatty acids are found in foods such as flaxseeds and coldwater fish, like sardines, salmon, and mackerel.  Limit how much you eat of the following: ? Canned or prepackaged foods. ? Food that is high in trans fat, such as fried foods. ? Food that is high in saturated fat, such as fatty meat. ? Sweets, desserts, sugary drinks, and other foods with added sugar. ? Full-fat dairy products.  Do not salt foods before eating.  Try to eat at least 2 vegetarian meals each week.  Eat more home-cooked food and less restaurant, buffet, and fast  food.  When eating at a restaurant, ask that your food be prepared with less salt or no salt, if possible. What foods are recommended? The items listed may not be a complete list. Talk with your dietitian about what dietary choices are best for you. Grains Whole-grain or whole-wheat bread. Whole-grain or whole-wheat pasta. Brown rice. [place]. Bulgur. Whole-grain and low-sodium cereals. Pita bread. Low-fat, low-sodium crackers. Whole-wheat flour tortillas. Vegetables Fresh or frozen vegetables (raw, steamed, roasted, or grilled). Low-sodium or reduced-sodium  tomato and vegetable juice. Low-sodium or reduced-sodium tomato sauce and tomato paste. Low-sodium or reduced-sodium canned vegetables. Fruits All fresh, dried, or frozen fruit. Canned fruit in natural juice (without added sugar). Meat and other protein foods Skinless chicken or [place]. Ground chicken or [place]. Pork with fat trimmed off. Fish and seafood. Egg whites. Dried beans, peas, or lentils. Unsalted nuts, nut butters, and seeds. Unsalted canned beans. Lean cuts of beef with fat trimmed off. Low-sodium, lean deli meat. Dairy Low-fat (1%) or fat-free (skim) milk. Fat-free, low-fat, or reduced-fat cheeses. Nonfat, low-sodium ricotta or cottage cheese. Low-fat or nonfat yogurt. Low-fat, low-sodium cheese. Fats and oils Soft margarine without trans fats. Vegetable oil. Low-fat, reduced-fat, or light mayonnaise and salad dressings (reduced-sodium). Canola, safflower, olive, soybean, and sunflower oils. Avocado. Seasoning and other foods Herbs. Spices. Seasoning mixes without salt. Unsalted popcorn and pretzels. Fat-free sweets. What foods are not recommended? The items listed may not be a complete list. Talk with your dietitian about what dietary choices are best for you. Grains Baked goods made with fat, such as croissants, muffins, or some breads. Dry pasta or rice meal packs. Vegetables Creamed or fried vegetables. Vegetables in a cheese sauce. Regular canned vegetables (not low-sodium or reduced-sodium). Regular canned tomato sauce and paste (not low-sodium or reduced-sodium). Regular tomato and vegetable juice (not low-sodium or reduced-sodium). [name]. Olives. Fruits Canned fruit in a light or heavy syrup. Fried fruit. Fruit in cream or butter sauce. Meat and other protein foods Fatty cuts of meat. Ribs. Fried meat. [name]. Sausage. Bologna and other processed lunch meats. Salami. Fatback. Hotdogs. Bratwurst. Salted nuts and seeds. Canned beans with added salt. Canned or smoked fish.  Whole eggs or egg yolks. Chicken or [place] with skin. Dairy Whole or 2% milk, cream, and half-and-half. Whole or full-fat cream cheese. Whole-fat or sweetened yogurt. Full-fat cheese. Nondairy creamers. Whipped toppings. Processed cheese and cheese spreads. Fats and oils Butter. Stick margarine. Lard. Shortening. Ghee. Bacon fat. Tropical oils, such as coconut, palm kernel, or palm oil. Seasoning and other foods Salted popcorn and pretzels. Onion salt, garlic salt, seasoned salt, table salt, and sea salt. Worcestershire sauce. Tartar sauce. Barbecue sauce. Teriyaki sauce. Soy sauce, including reduced-sodium. Steak sauce. Canned and packaged gravies. Fish sauce. Oyster sauce. Cocktail sauce. Horseradish that you find on the shelf. Ketchup. Mustard. Meat flavorings and tenderizers. Bouillon cubes. Hot sauce and Tabasco sauce. Premade or packaged marinades. Premade or packaged taco seasonings. Relishes. Regular salad dressings. Where to find more information:  National Heart, Lung, and [place]: https://wilson-eaton.com/  American Heart Association: www.heart.org Summary  The DASH eating plan is a healthy eating plan that has been shown to reduce high blood pressure (hypertension). It may also reduce your risk for type 2 diabetes, heart disease, and stroke.  With the DASH eating plan, you should limit salt (sodium) intake to 2,300 mg a day. If you have hypertension, you may need to reduce your sodium intake to 1,500 mg a day.  When on the DASH  eating plan, aim to eat more fresh fruits and vegetables, whole grains, lean proteins, low-fat dairy, and heart-healthy fats.  Work with your health care provider or diet and nutrition specialist (dietitian) to adjust your eating plan to your individual calorie needs. This information is not intended to replace advice given to you by your health care provider. Make sure you discuss any questions you have with your health care provider. Document Revised:  04/23/2017 Document Reviewed: 05/04/2016 Elsevier Patient Education  2020 [employer].

## 2019-08-09 NOTE — Progress Notes (Signed)
  Careteam: Patient Care Team: Eubanks, Jessica K, NP as PCP - General (Geriatric Medicine) Jacobs, Daniel P, MD as Attending Physician (Gastroenterology)  PLACE OF SERVICE:  [facility]  Advanced Directive information    Allergies  Allergen Reactions  . Bee Venom   . Niacin And Related   . Poison Ivy Extract     Chief Complaint  Patient presents with  . Follow-up    6 week blood pressure follow-up. At home readings running high     HPI: Patient is a 59 y.o. female for blood pressure follow up.  Reports blood pressure was running "good" 120/80, 125/80 and were like that for a few days but then high for days. sbp this morning 180. Has come down since. Would spike up and then come down Has given up a lot of sodium. Overall feeling fine. No headaches, dizziness, chest pains.  Following up with back doctor in may, since stopping advil back is better Gabapentin helping, only uses twice daily if needed   Review of Systems:  Review of Systems  Constitutional: Negative for chills, fever and weight loss.  HENT: Negative for tinnitus.   Respiratory: Negative for cough, sputum production and shortness of breath.   Cardiovascular: Negative for chest pain, palpitations and leg swelling.  Gastrointestinal: Negative for abdominal pain, constipation, diarrhea and heartburn.  Genitourinary: Positive for frequency. Negative for dysuria and urgency.  Musculoskeletal: Negative for back pain, falls, joint pain and myalgias.  Skin: Negative.   Neurological: Negative for dizziness and headaches.  Psychiatric/Behavioral: Negative for depression and memory loss. The patient does not have insomnia.     Past Medical History:  Diagnosis Date  . Allergy   . GERD (gastroesophageal reflux disease)   . Glaucoma   . Headache(784.0)   . Hyperlipidemia   . Hypertension   . Osteoarthritis   . Thyroid disease    hypo  . Type II or unspecified type diabetes mellitus without mention of  complication, uncontrolled 09/19/2010   Past Surgical History:  Procedure Laterality Date  . BREAST SURGERY     Lumpectomy- left breast, no lymph node involvement   Social History:   reports that she quit smoking about 19 years ago. Her smoking use included cigarettes. She has a 25.00 pack-year smoking history. She has never used smokeless tobacco. She reports that she does not drink alcohol or use drugs.  Family History  Problem Relation Age of Onset  . Heart disease Mother   . Diabetes Mother   . Hypertension Mother   . Alcohol abuse Other   . Arthritis Other   . Hyperlipidemia Other   . Hypertension Other   . Stroke Other        Female 1st degree relative < 60  . Diabetes Maternal Grandmother   . Colon cancer Neg Hx   . Esophageal cancer Neg Hx   . Stomach cancer Neg Hx   . Rectal cancer Neg Hx     Medications: Patient's Medications  New Prescriptions   No medications on file  Previous Medications   ACETAMINOPHEN (TYLENOL 8 HOUR ARTHRITIS PAIN) 650 MG CR TABLET    Take 1,300 mg by mouth 2 (two) times daily.    DIAZEPAM (VALIUM) 5 MG TABLET    1 tablet by mouth prior to dental appointment, if surgery not prior to cleanings   FEXOFENADINE (ALLEGRA) 180 MG TABLET    Take 180 mg by mouth daily as needed for allergies or rhinitis.   GABAPENTIN (NEURONTIN) 100   MG CAPSULE    Take 1 capsule (100 mg total) by mouth 3 (three) times daily as needed.   LEVOTHYROXINE (SYNTHROID) 125 MCG TABLET    TAKE 1 TABLET BY MOUTH EVERY DAY   LOSARTAN (COZAAR) 100 MG TABLET    TAKE 1 TABLET BY MOUTH EVERY DAY   MULTIPLE VITAMINS-MINERALS ([facility] SPECTRAVITE PO)    Take 1 tablet by mouth daily.   OMEPRAZOLE (PRILOSEC) 40 MG CAPSULE    Be sure to take 1/2 hour before meal.   OXYBUTYNIN (DITROPAN) 5 MG TABLET    Take 5 mg by mouth 2 (two) times daily.   PROBIOTIC PRODUCT (PROBIOTIC DAILY PO)    Take 1 capsule by mouth daily.   PROPYLENE GLYCOL (SYSTANE COMPLETE OP)    Apply to eye 2 (two) times daily.  1 drop in both eyes in the morning and evening   ROSUVASTATIN (CRESTOR) 40 MG TABLET    Take 1 tablet (40 mg total) by mouth daily.  Modified Medications   No medications on file  Discontinued Medications   IBUPROFEN-ACETAMINOPHEN (ADVIL DUAL ACTION) 125-250 MG TABS    Take 2 tablets by mouth 2 (two) times daily.    Physical Exam:  Vitals:   08/09/19 0859  BP: (!) 146/84  Pulse: 74  Temp: (!) 96.6 F (35.9 C)  TempSrc: Temporal  SpO2: 98%  Weight: 220 lb (99.8 kg)   Body mass index is 32.49 kg/m. Wt Readings from Last 3 Encounters:  08/09/19 220 lb (99.8 kg)  07/05/19 219 lb 12.8 oz (99.7 kg)  01/16/19 214 lb 9.6 oz (97.3 kg)    Physical Exam Constitutional:      General: She is not in acute distress.    Appearance: She is well-developed. She is not diaphoretic.  HENT:     Head: Normocephalic and atraumatic.  Cardiovascular:     Rate and Rhythm: Normal rate and regular rhythm.     Heart sounds: Normal heart sounds.  Pulmonary:     Effort: Pulmonary effort is normal.     Breath sounds: Normal breath sounds.  Musculoskeletal:        General: No tenderness.     Cervical back: Normal range of motion and neck supple.  Skin:    General: Skin is warm and dry.  Neurological:     Mental Status: She is alert and oriented to person, place, and time.  Psychiatric:        Mood and Affect: Mood normal.        Behavior: Behavior normal.     Labs reviewed: Basic Metabolic Panel: Recent Labs    12/30/18 0805 06/22/19 0805  NA 141 141  K 4.4 4.3  CL 107 105  CO2 27 27  GLUCOSE 113* 115*  BUN 22 27*  CREATININE 1.03 1.09*  CALCIUM 10.0 9.9  TSH 4.02  --    Liver Function Tests: Recent Labs    12/30/18 0805 06/22/19 0805  AST 19 19  ALT 22 23  BILITOT 0.4 0.5  PROT 6.7 7.0   No results for input(s): LIPASE, AMYLASE in the last 8760 hours. No results for input(s): AMMONIA in the last 8760 hours. CBC: Recent Labs    12/30/18 0805 06/22/19 0805  WBC 6.7  7.1  NEUTROABS 3,551 4,189  HGB 15.7* 16.1*  HCT 46.7* 47.9*  MCV 92.3 91.1  PLT 227 229   Lipid Panel: Recent Labs    12/30/18 0805 06/22/19 0805  CHOL 180 200*  HDL 51 56  LDLCALC 109* 121*  TRIG 108 120  CHOLHDL 3.5 3.6   TSH: Recent Labs    12/30/18 0805  TSH 4.02   A1C: Lab Results  Component Value Date   HGBA1C 5.9 (H) 06/22/2019     Assessment/Plan 1. Essential hypertension -blood pressure not well controlled on losartan alone. To continue losartan and will add hctz 25 mg daily. To continue dietary modifications. DASH diet given.  - hydrochlorothiazide (HYDRODIURIL) 25 MG tablet; Take 1 tablet (25 mg total) by mouth daily.  Dispense: 90 tablet; Refill: 0 - BMP with eGFR([facility])  2. Low back pain at multiple sites Follows with Dr Wong has upcoming appt, pain has improved since off NSAIDs and use of gabapentin.   Next appt: 4 weeks for bp Jessica K. Eubanks, AGNP  [facility] [phone]  

## 2019-08-10 LAB — BASIC METABOLIC PANEL WITH GFR
BUN: 21 mg/dL (ref 7–25)
CO2: 24 mmol/L (ref 20–32)
Calcium: 9.9 mg/dL (ref 8.6–10.4)
Chloride: 105 mmol/L (ref 98–110)
Creat: 0.92 mg/dL (ref 0.50–1.05)
GFR, Est African American: 79 mL/min/{1.73_m2} (ref >=60)
GFR, Est Non African American: 68 mL/min/{1.73_m2} (ref >=60)
Glucose, Bld: 104 mg/dL (ref 65–139)
Potassium: 3.9 mmol/L (ref 3.5–5.3)
Sodium: 138 mmol/L (ref 135–146)

## 2019-08-16 ENCOUNTER — Ambulatory Visit: Payer: Managed Care, Other (non HMO) | Admitting: Nurse Practitioner

## 2019-09-05 ENCOUNTER — Ambulatory Visit
Admission: EM | Admit: 2019-09-05 | Discharge: 2019-09-05 | Disposition: A | Discharge status: Home / Self Care | Payer: Self-pay | Attending: Physician Assistant | Admitting: Physician Assistant

## 2019-09-05 DIAGNOSIS — W268XXA Contact with other sharp object(s), not elsewhere classified, initial encounter: Secondary | ICD-10-CM

## 2019-09-05 DIAGNOSIS — S61209A Unspecified open wound of unspecified finger without damage to nail, initial encounter: Secondary | ICD-10-CM

## 2019-09-05 DIAGNOSIS — S61002A Unspecified open wound of left thumb without damage to nail, initial encounter: Secondary | ICD-10-CM

## 2019-09-05 MED ORDER — LIDOCAINE-EPINEPHRINE-TETRACAINE (LET) TOPICAL GEL
3.0000 mL | Freq: Once | TOPICAL | Status: AC
  Administered 2019-09-05: 3 mL via TOPICAL

## 2019-09-05 NOTE — ED Triage Notes (Signed)
Pt states pintched her lt thumb while changing a blade on her saw zaw. Small area where skin is removed noted with bleeding controlled

## 2019-09-05 NOTE — Discharge Instructions (Signed)
You can get area wet after 24 hours. Avoid chemicals that can break down the glue. Keep wound clean and dry. Do not soak area in water. Keep finger splint on for 5-7 days to prevent opening area. Monitor for spreading redness, increased warmth, increased swelling, fever, follow up for reevaluation needed. Otherwise dermabond will fall off on own once area is healed.

## 2019-09-05 NOTE — ED Provider Notes (Addendum)
[facility]    CSN: [id number] Arrival date & time: 09/05/19  1548      History   Chief Complaint Chief Complaint  Patient presents with  . Abrasion    HPI Adriana Young is a 59 y.o. female.   59 year old female comes in for left thumb skin avulsion that occurred shortly prior to arrival. She was switching out the blade for her saw zaw when she sustained the skin avulsion. Bleeding controlled with pressure. Denies numbness/tingling. Tetanus up to date.      Past Medical History:  Diagnosis Date  . Allergy   . GERD (gastroesophageal reflux disease)   . Glaucoma   . Headache(784.0)   . Hyperlipidemia   . Hypertension   . Osteoarthritis   . Thyroid disease    hypo  . Type II or unspecified type diabetes mellitus without mention of complication, uncontrolled 09/19/2010    Patient Active Problem List   Diagnosis Date Noted  . Low back pain at multiple sites 10/14/2015  . Degenerative joint disease (DJD) of hip 10/14/2015  . Routine general medical examination at a health care facility 11/01/2014  . Fatty liver 09/19/2010  . Hypothyroidism 06/18/2008  . Hyperlipidemia with target LDL less than 100 06/04/2008  . Essential hypertension 06/04/2008  . GERD 06/04/2008  . Generalized OA 06/04/2008    Past Surgical History:  Procedure Laterality Date  . BREAST SURGERY     Lumpectomy- left breast, no lymph node involvement    OB History   No obstetric history on file.      Home Medications    Prior to Admission medications   Medication Sig Start Date End Date Taking? Authorizing Provider  acetaminophen (TYLENOL 8 HOUR ARTHRITIS PAIN) 650 MG CR tablet Take 1,300 mg by mouth 2 (two) times daily.     [provider]  diazepam (VALIUM) 5 MG tablet 1 tablet by mouth prior to dental appointment, if surgery not prior to cleanings 10/05/17   [provider]  fexofenadine (ALLEGRA) 180 MG tablet Take 180 mg by mouth daily as needed for allergies  or rhinitis.    [provider]  gabapentin (NEURONTIN) 100 MG capsule Take 1 capsule (100 mg total) by mouth 3 (three) times daily as needed. 07/05/19   Lauree Chandler, NP  hydrochlorothiazide (HYDRODIURIL) 25 MG tablet Take 1 tablet (25 mg total) by mouth daily. 08/09/19   Lauree Chandler, NP  levothyroxine (SYNTHROID) 125 MCG tablet TAKE 1 TABLET BY MOUTH EVERY DAY 02/02/19   Lauree Chandler, NP  losartan (COZAAR) 100 MG tablet TAKE 1 TABLET BY MOUTH EVERY DAY 07/03/19   Lauree Chandler, NP  Multiple Vitamins-Minerals ([employer] SPECTRAVITE PO) Take 1 tablet by mouth daily.    [provider]  omeprazole (PRILOSEC) 40 MG capsule Be sure to take 1/2 hour before meal. 01/02/19   Lauree Chandler, NP  oxybutynin (DITROPAN) 5 MG tablet Take 5 mg by mouth 2 (two) times daily. 06/22/17   [provider]  Probiotic Product (PROBIOTIC DAILY PO) Take 1 capsule by mouth daily.    [provider]  Propylene Glycol (SYSTANE COMPLETE OP) Apply to eye 2 (two) times daily. 1 drop in both eyes in the morning and evening    [provider]  rosuvastatin (CRESTOR) 40 MG tablet Take 1 tablet (40 mg total) by mouth daily. 07/05/19   Lauree Chandler, NP    Family History Family History  Problem Relation Age of  Onset  . Heart disease Mother   . Diabetes Mother   . Hypertension Mother   . Alcohol abuse Other   . Arthritis Other   . Hyperlipidemia Other   . Hypertension Other   . Stroke Other        Female 1st degree relative < 60  . Diabetes Maternal Grandmother   . Colon cancer Neg Hx   . Esophageal cancer Neg Hx   . Stomach cancer Neg Hx   . Rectal cancer Neg Hx     Social History Social History   Tobacco Use  . Smoking status: Former Smoker    Packs/day: 1.00    Years: 25.00    Pack years: 25.00    Types: Cigarettes    Quit date: 05/25/2000    Years since quitting: 19.2  . Smokeless tobacco: Never Used  Substance Use Topics  . Alcohol use: No   . Drug use: No     Allergies   Bee venom, Niacin and related, and Poison ivy extract   Review of Systems Review of Systems  Reason unable to perform ROS: See HPI as above.     Physical Exam Triage Vital Signs ED Triage Vitals  Enc Vitals Group     BP 09/05/19 1609 (!) 164/81     Pulse Rate 09/05/19 1609 71     Resp 09/05/19 1609 16     Temp 09/05/19 1609 98.6 F (37 C)     Temp Source 09/05/19 1609 Oral     SpO2 09/05/19 1609 98 %     Weight --      Height --      Head Circumference --      Peak Flow --      Pain Score 09/05/19 1610 0     Pain Loc --      Pain Edu? --      Excl. in [place]? --    No data found.  Updated Vital Signs BP (!) 164/81 (BP Location: Left Arm)   Pulse 71   Temp 98.6 F (37 C) (Oral)   Resp 16   LMP 10/25/2014   SpO2 98%   Physical Exam Constitutional:      General: She is not in acute distress.    Appearance: Normal appearance. She is well-developed. She is not toxic-appearing or diaphoretic.  HENT:     Head: Normocephalic and atraumatic.  Eyes:     Conjunctiva/sclera: Conjunctivae normal.     Pupils: Pupils are equal, round, and reactive to light.  Pulmonary:     Effort: Pulmonary effort is normal. No respiratory distress.     Comments: Speaking in full sentences without difficulty Musculoskeletal:     Cervical back: Normal range of motion and neck supple.     Comments: 0.5cm x 0.3cm skin avulsion to the left ventral thumb. Bleeding controlled without pressure. Full ROM of thumb. NVI.   Skin:    General: Skin is warm and dry.  Neurological:     Mental Status: She is alert and oriented to person, place, and time.      UC Treatments / Results  Labs (all labs ordered are listed, but only abnormal results are displayed) Labs Reviewed - No data to display  EKG   Radiology No results found.  Procedures Laceration Repair  Date/Time: 09/05/2019 6:11 PM Performed by: Ok Edwards, PA-C Authorized by: Ok Edwards, PA-C    Consent:    Consent obtained:  Verbal   Consent given  by:  Patient   Risks discussed:  Infection, poor cosmetic result and poor wound healing   Alternatives discussed:  No treatment Anesthesia (see MAR for exact dosages):    Anesthesia method:  Topical application   Topical anesthetic:  LET Laceration details:    Location:  Finger   Finger location:  L thumb   Wound length (cm): 0.5cm x 0.3cm.   Laceration depth: 2. Pre-procedure details:    Preparation:  Patient was prepped and draped in usual sterile fashion Exploration:    Hemostasis achieved with:  LET   Wound exploration: wound explored through full range of motion and entire depth of wound probed and visualized     Contaminated: no   Treatment:    Area cleansed with:  Hibiclens   Amount of cleaning:  Standard   Irrigation solution:  Sterile saline   Irrigation method:  Tap   Visualized foreign bodies/material removed: no   Skin repair:    Repair method:  Tissue adhesive Approximation:    Approximation:  Loose Post-procedure details:    Dressing:  Splint for protection   Patient tolerance of procedure:  Tolerated well, no immediate complications   (including critical care time)  Medications Ordered in UC Medications  lidocaine-EPINEPHrine-tetracaine (LET) topical gel (3 mLs Topical Given 09/05/19 1623)    Initial Impression / Assessment and Plan / UC Course  I have reviewed the triage vital signs and the nursing notes.  Pertinent labs & imaging results that were available during my care of the patient were reviewed by me and considered in my medical decision making (see chart for details).     Patient tolerated procedure well. Wound care instructions given. Finger splint applied. Return precautions given. Patient expresses understanding and agrees to plan.  Final Clinical Impressions(s) / UC Diagnoses   Final diagnoses:  Avulsion of skin of finger, initial encounter    ED Prescriptions    None      PDMP not reviewed this encounter.   Ok Edwards, PA-C 09/05/19 [place], Dniyah Grant V, PA-C 09/05/19 1814

## 2019-09-06 ENCOUNTER — Ambulatory Visit: Payer: Managed Care, Other (non HMO) | Admitting: Nurse Practitioner

## 2019-09-25 ENCOUNTER — Other Ambulatory Visit: Payer: Self-pay | Admitting: Nurse Practitioner

## 2019-09-25 DIAGNOSIS — I10 Essential (primary) hypertension: Secondary | ICD-10-CM

## 2019-10-29 ENCOUNTER — Other Ambulatory Visit: Payer: Self-pay | Admitting: Nurse Practitioner

## 2019-12-27 ENCOUNTER — Other Ambulatory Visit: Payer: Self-pay | Admitting: Nurse Practitioner

## 2019-12-27 DIAGNOSIS — I10 Essential (primary) hypertension: Secondary | ICD-10-CM

## 2019-12-27 DIAGNOSIS — K219 Gastro-esophageal reflux disease without esophagitis: Secondary | ICD-10-CM

## 2020-01-16 ENCOUNTER — Ambulatory Visit (INDEPENDENT_AMBULATORY_CARE_PROVIDER_SITE_OTHER): Payer: Managed Care, Other (non HMO) | Admitting: Family

## 2020-01-16 ENCOUNTER — Encounter: Payer: Self-pay | Admitting: Family

## 2020-01-16 ENCOUNTER — Other Ambulatory Visit: Payer: Self-pay

## 2020-01-16 VITALS — BP 130/86 | HR 72 | Temp 96.2°F | Resp 16 | Ht 69.0 in | Wt 210.6 lb

## 2020-01-16 DIAGNOSIS — R399 Unspecified symptoms and signs involving the genitourinary system: Secondary | ICD-10-CM

## 2020-01-16 LAB — POCT URINALYSIS DIPSTICK
Bilirubin, UA: NEGATIVE
Blood, UA: POSITIVE
Glucose, UA: NEGATIVE
Ketones, UA: NEGATIVE
Nitrite, UA: NEGATIVE
Protein, UA: POSITIVE — AB
Spec Grav, UA: 1.015 (ref 1.010–1.025)
Urobilinogen, UA: NEGATIVE E.U./dL — AB
pH, UA: 8 (ref 5.0–8.0)

## 2020-01-16 MED ORDER — CIPROFLOXACIN HCL 500 MG PO TABS: 500.0000 mg | ORAL_TABLET | Freq: Two times a day (BID) | ORAL | 0 refills | Status: AC

## 2020-01-16 NOTE — Patient Instructions (Signed)
Urinary Tract Infection, Adult A urinary tract infection (UTI) is an infection of any part of the urinary tract. The urinary tract includes:  The kidneys.  The ureters.  The bladder.  The urethra. These organs make, store, and get rid of pee (urine) in the body. What are the causes? This is caused by germs (bacteria) in your genital area. These germs grow and cause swelling (inflammation) of your urinary tract. What increases the risk? You are more likely to develop this condition if:  You have a small, thin tube (catheter) to drain pee.  You cannot control when you pee or poop (incontinence).  You are female, and: ? You use these methods to prevent pregnancy:  A medicine that kills sperm (spermicide).  A device that blocks sperm (diaphragm). ? You have low levels of a female hormone (estrogen). ? You are pregnant.  You have genes that add to your risk.  You are sexually active.  You take antibiotic medicines.  You have trouble peeing because of: ? A prostate that is bigger than normal, if you are female. ? A blockage in the part of your body that drains pee from the bladder (urethra). ? A kidney stone. ? A nerve condition that affects your bladder (neurogenic bladder). ? Not getting enough to drink. ? Not peeing often enough.  You have other conditions, such as: ? Diabetes. ? A weak disease-fighting system (immune system). ? Sickle cell disease. ? Gout. ? Injury of the spine. What are the signs or symptoms? Symptoms of this condition include:  Needing to pee right away (urgently).  Peeing often.  Peeing small amounts often.  Pain or burning when peeing.  Blood in the pee.  Pee that smells bad or not like normal.  Trouble peeing.  Pee that is cloudy.  Fluid coming from the vagina, if you are female.  Pain in the belly or lower back. Other symptoms include:  Throwing up (vomiting).  No urge to eat.  Feeling mixed up (confused).  Being tired  and grouchy (irritable).  A fever.  Watery poop (diarrhea). How is this treated? This condition may be treated with:  Antibiotic medicine.  Other medicines.  Drinking enough water. Follow these instructions at home:  Medicines  Take over-the-counter and prescription medicines only as told by your doctor.  If you were prescribed an antibiotic medicine, take it as told by your doctor. Do not stop taking it even if you start to feel better. General instructions  Make sure you: ? Pee until your bladder is empty. ? Do not hold pee for a long time. ? Empty your bladder after sex. ? Wipe from front to back after pooping if you are a female. Use each tissue one time when you wipe.  Drink enough fluid to keep your pee pale yellow.  Keep all follow-up visits as told by your doctor. This is important. Contact a doctor if:  You do not get better after 1-2 days.  Your symptoms go away and then come back. Get help right away if:  You have very bad back pain.  You have very bad pain in your lower belly.  You have a fever.  You are sick to your stomach (nauseous).  You are throwing up. Summary  A urinary tract infection (UTI) is an infection of any part of the urinary tract.  This condition is caused by germs in your genital area.  There are many risk factors for a UTI. These include having a small, thin   tube to drain pee and not being able to control when you pee or poop.  Treatment includes antibiotic medicines for germs.  Drink enough fluid to keep your pee pale yellow. This information is not intended to replace advice given to you by your health care provider. Make sure you discuss any questions you have with your health care provider. Document Revised: 04/28/2018 Document Reviewed: 11/18/2017 Elsevier Patient Education  2020 Elsevier Inc.  

## 2020-01-16 NOTE — Progress Notes (Signed)
Provider: Colette Dicamillo FNP-C  Lauree Chandler, NP  Patient Care Team: Lauree Chandler, NP as PCP - General (Geriatric Medicine) [name], MD as Attending Physician (Gastroenterology)  Extended Emergency Contact Information Primary Emergency Contact: Hope Pigeon Address: [address]          [place] [address] [place] Phone: [phone] Relation: Spouse  Code Status: Full Code  Goals of care: Advanced Directive information Advanced Directives 01/16/2020  Does Patient Have a Medical Advance Directive? No  Type of Advance Directive -  Does patient want to make changes to medical advance directive? -  Copy of [place] in Chart? -  Would patient like information on creating a medical advance directive? No - Patient declined     Chief Complaint  Patient presents with  . Acute Visit    Hurting when urinating, urinary frequency, and odor x 4 days.    HPI:  Pt is a 59 y.o. female seen today for an acute visit for evaluation of Hurting with urination x 4 days.Has had urinary frequency and odor.Has chronic lower back pain following up with chiropractor. She denies any fever,chills.lower abd pain. No nausea or vomiting.   Past Medical History:  Diagnosis Date  . Allergy   . GERD (gastroesophageal reflux disease)   . Glaucoma   . Headache(784.0)   . Hyperlipidemia   . Hypertension   . Osteoarthritis   . Thyroid disease    hypo  . Type II or unspecified type diabetes mellitus without mention of complication, uncontrolled 09/19/2010   Past Surgical History:  Procedure Laterality Date  . BREAST SURGERY     Lumpectomy- left breast, no lymph node involvement    Allergies  Allergen Reactions  . Bee Venom   . Niacin And Related   . Poison Ivy Extract     Outpatient Encounter Medications as of 01/16/2020  Medication Sig  . acetaminophen (TYLENOL 8 HOUR ARTHRITIS PAIN) 650 MG CR tablet Take 1,300 mg by mouth 2 (two)  times daily.   . diazepam (VALIUM) 5 MG tablet 1 tablet by mouth prior to dental appointment, if surgery not prior to cleanings  . fexofenadine (ALLEGRA) 180 MG tablet Take 180 mg by mouth daily as needed for allergies or rhinitis.  Marland Kitchen gabapentin (NEURONTIN) 100 MG capsule Take 1 capsule (100 mg total) by mouth 3 (three) times daily as needed.  . hydrochlorothiazide (HYDRODIURIL) 25 MG tablet Take 1 tablet (25 mg total) by mouth daily.  Marland Kitchen levothyroxine (SYNTHROID) 125 MCG tablet TAKE 1 TABLET BY MOUTH EVERY DAY  . losartan (COZAAR) 100 MG tablet TAKE 1 TABLET BY MOUTH EVERY DAY  . Multiple Vitamins-Minerals ([employer] SPECTRAVITE PO) Take 1 tablet by mouth daily.  Marland Kitchen omeprazole (PRILOSEC) 40 MG capsule TAKE 1 CAPSULE BY MOUTH EVERY DAY (BE SURE TO TAKE 1/2 HOUR BEFORE MEAL)  . oxybutynin (DITROPAN) 5 MG tablet Take 5 mg by mouth 2 (two) times daily.  . Probiotic Product (PROBIOTIC DAILY PO) Take 1 capsule by mouth daily.  Marland Kitchen Propylene Glycol (SYSTANE COMPLETE OP) Apply to eye 2 (two) times daily. 1 drop in both eyes in the morning and evening  . rosuvastatin (CRESTOR) 40 MG tablet Take 1 tablet (40 mg total) by mouth daily.   No facility-administered encounter medications on file as of 01/16/2020.    Review of Systems  Constitutional: Negative for appetite change, chills, fatigue and fever.  Respiratory: Negative for cough, chest tightness, shortness of breath and  wheezing.   Cardiovascular: Negative for chest pain, palpitations and leg swelling.  Gastrointestinal: Negative for abdominal distention, abdominal pain, constipation, diarrhea, nausea and vomiting.  Genitourinary: Positive for difficulty urinating, dysuria and frequency. Negative for decreased urine volume, flank pain and urgency.  Musculoskeletal: Positive for arthralgias and back pain. Negative for gait problem and joint swelling.       Chronic back pain   Psychiatric/Behavioral: Negative for agitation, confusion and sleep disturbance.  The patient is not nervous/anxious.     Immunization History  Administered Date(s) Administered  . Influenza Inj Mdck Quad Pf 03/17/2017  . Influenza Whole 03/06/2013  . Influenza,inj,Quad PF,6+ Mos 02/07/2016, 01/16/2019  . Influenza-Unspecified 03/14/2013, 03/04/2015, 03/17/2017, 02/28/2018  . PFIZER SARS-COV-2 Vaccination 09/21/2019, 10/12/2019  . Pneumococcal Polysaccharide-23 08/11/2012  . Tdap 09/07/2007, 11/02/2017, 12/30/2018   Pertinent  Health Maintenance Due  Topic Date Due  . INFLUENZA VACCINE  12/24/2019  . MAMMOGRAM  05/24/2021  . COLONOSCOPY  09/17/2021  . PAP SMEAR-Modifier  06/20/2022   Fall Risk  01/16/2020 08/09/2019 07/05/2019 01/16/2019 01/02/2019  Falls in the past year? 0 0 0 0 0  Number falls in past yr: 0 0 0 0 0  Injury with Fall? 0 0 0 0 0    Vitals:   01/16/20 0927  BP: 130/86  Pulse: 72  Resp: 16  Temp: (!) 96.2 F (35.7 C)  SpO2: 98%  Weight: 210 lb 9.6 oz (95.5 kg)  Height: 5\' 9"  (1.753 m)   Body mass index is 31.1 kg/m. Physical Exam Vitals reviewed.  Constitutional:      General: She is not in acute distress.    Appearance: She is obese. She is not ill-appearing.  HENT:     Head: Normocephalic.  Cardiovascular:     Rate and Rhythm: Normal rate and regular rhythm.     Pulses: Normal pulses.     Heart sounds: Normal heart sounds. No murmur heard.  No friction rub. No gallop.   Pulmonary:     Effort: Pulmonary effort is normal. No respiratory distress.     Breath sounds: Normal breath sounds. No wheezing, rhonchi or rales.  Chest:     Chest wall: No tenderness.  Abdominal:     General: Bowel sounds are normal. There is no distension.     Palpations: Abdomen is soft. There is no mass.     Tenderness: There is no abdominal tenderness. There is no right CVA tenderness, left CVA tenderness, guarding or rebound.  Musculoskeletal:        General: No swelling or tenderness. Normal range of motion.     Right lower leg: No edema.     Left  lower leg: No edema.  Skin:    General: Skin is warm.     Coloration: Skin is not pale.     Findings: No erythema or rash.  Neurological:     Mental Status: She is alert and oriented to person, place, and time.     Cranial Nerves: No cranial nerve deficit.     Motor: No weakness.     Gait: Gait normal.  Psychiatric:        Mood and Affect: Mood normal.        Behavior: Behavior normal.        Thought Content: Thought content normal.        Judgment: Judgment normal.     Labs reviewed: Recent Labs    06/22/19 0805 08/09/19 0921  NA 141 138  K 4.3 3.9  CL 105 105  CO2 27 24  GLUCOSE 115* 104  BUN 27* 21  CREATININE 1.09* 0.92  CALCIUM 9.9 9.9   Recent Labs    06/22/19 0805  AST 19  ALT 23  BILITOT 0.5  PROT 7.0   Recent Labs    06/22/19 0805  WBC 7.1  NEUTROABS 4,189  HGB 16.1*  HCT 47.9*  MCV 91.1  PLT 229   Lab Results  Component Value Date   TSH 4.02 12/30/2018   Lab Results  Component Value Date   HGBA1C 5.9 (H) 06/22/2019   Lab Results  Component Value Date   CHOL 200 (H) 06/22/2019   HDL 56 06/22/2019   LDLCALC 121 (H) 06/22/2019   LDLDIRECT 189.4 07/18/2009   TRIG 120 06/22/2019   CHOLHDL 3.6 06/22/2019    Significant Diagnostic Results in last 30 days:  No results found.  Assessment/Plan 1. Symptoms of urinary tract infection Afebrile.reports dysuria,urine frequency,urgeny and urine odor. - POC Urinalysis Dipstick negative for nitrites but positive for blood and 3+ Leukocytes.recommended sending urine for culture and sensitivity.Would like to begin ABXs aware will need to change if culture and sensitivity indicates a different antibiotic. Will start on Cipro side effects discussed.On Probiotic does not care for Yogurt but will eat if necessary.  - Culture, Urine - ciprofloxacin (CIPRO) 500 MG tablet; Take 1 tablet (500 mg total) by mouth 2 (two) times daily for 7 days.  Dispense: 14 tablet; Refill: 0  Family/ staff Communication:  Reviewed plan of care with patient verbalized understanding.  Labs/tests ordered:  - Culture, Urine Next Appointment: as needed if symptoms worsen or fail to improve.   Sandrea Hughs, NP

## 2020-01-18 ENCOUNTER — Other Ambulatory Visit: Payer: Self-pay | Admitting: Nurse Practitioner

## 2020-01-18 DIAGNOSIS — G8929 Other chronic pain: Secondary | ICD-10-CM

## 2020-01-18 DIAGNOSIS — M5442 Lumbago with sciatica, left side: Secondary | ICD-10-CM

## 2020-01-19 LAB — URINE CULTURE
MICRO NUMBER:: 10866982
SPECIMEN QUALITY:: ADEQUATE

## 2020-04-09 ENCOUNTER — Other Ambulatory Visit: Payer: Self-pay | Admitting: Nurse Practitioner

## 2020-04-09 DIAGNOSIS — I10 Essential (primary) hypertension: Secondary | ICD-10-CM

## 2020-06-26 ENCOUNTER — Ambulatory Visit: Payer: Self-pay | Admitting: Internal Medicine

## 2020-07-02 ENCOUNTER — Encounter: Payer: Self-pay | Admitting: Internal Medicine

## 2020-07-02 ENCOUNTER — Telehealth (INDEPENDENT_AMBULATORY_CARE_PROVIDER_SITE_OTHER): Payer: No Typology Code available for payment source | Admitting: Internal Medicine

## 2020-07-02 VITALS — Ht 68.0 in | Wt 231.0 lb

## 2020-07-02 DIAGNOSIS — R7303 Prediabetes: Secondary | ICD-10-CM

## 2020-07-02 DIAGNOSIS — E039 Hypothyroidism, unspecified: Secondary | ICD-10-CM

## 2020-07-02 DIAGNOSIS — Z7689 Persons encountering health services in other specified circumstances: Secondary | ICD-10-CM

## 2020-07-02 NOTE — Progress Notes (Signed)
Virtual Visit via Telephone Note  I connected with Adriana Young, on 07/02/2020 at 2:49 PM by telephone due to the COVID-19 pandemic and verified that I am speaking with the correct person using two identifiers.   Consent: I discussed the limitations, risks, security and privacy concerns of performing an evaluation and management service by telephone and the availability of in person appointments. I also discussed with the patient that there may be a patient responsible charge related to this service. The patient expressed understanding and agreed to proceed.   Location of Patient: Home   Location of Provider: Clinic    Persons participating in Telemedicine visit: Sheria Rosello [facility] Dr. Juleen China    History of Present Illness: Patient has a visit to establish care. Was previously established with [facility], has been seen within the past year. PMH of prediabetes, HTN, HLD. No surgical history.     Past Medical History:  Diagnosis Date   Allergy    Arthritis    Phreesia 06/30/2020   GERD (gastroesophageal reflux disease)    Glaucoma    Headache(784.0)    Hyperlipidemia    Hypertension    Osteoarthritis    Thyroid disease    hypo   Type II or unspecified type diabetes mellitus without mention of complication, uncontrolled 09/19/2010   Allergies  Allergen Reactions   Other Hives, Shortness Of Breath, Itching, Swelling and Rash    Bee stings    Bee Venom    Niacin And Related    Poison Ivy Extract     Current Outpatient Medications on File Prior to Visit  Medication Sig Dispense Refill   acetaminophen (TYLENOL 8 HOUR ARTHRITIS PAIN) 650 MG CR tablet Take 1,300 mg by mouth 2 (two) times daily.      diazepam (VALIUM) 5 MG tablet 1 tablet by mouth prior to dental appointment, if surgery not prior to cleanings  0   fexofenadine (ALLEGRA) 180 MG tablet Take 180 mg by mouth daily as needed for allergies or rhinitis.     gabapentin (NEURONTIN)  100 MG capsule TAKE 1 CAPSULE BY MOUTH THREE TIMES A DAY AS NEEDED 90 capsule 3   hydrochlorothiazide (HYDRODIURIL) 25 MG tablet TAKE 1 TABLET BY MOUTH EVERY DAY 90 tablet 1   levothyroxine (SYNTHROID) 125 MCG tablet TAKE 1 TABLET BY MOUTH EVERY DAY 90 tablet 1   losartan (COZAAR) 100 MG tablet TAKE 1 TABLET BY MOUTH EVERY DAY 90 tablet 1   Multiple Vitamins-Minerals ([employer] SPECTRAVITE PO) Take 1 tablet by mouth daily.     omeprazole (PRILOSEC) 40 MG capsule TAKE 1 CAPSULE BY MOUTH EVERY DAY (BE SURE TO TAKE 1/2 HOUR BEFORE MEAL) 90 capsule 3   oxybutynin (DITROPAN) 5 MG tablet Take 5 mg by mouth 2 (two) times daily.  12   Probiotic Product (PROBIOTIC DAILY PO) Take 1 capsule by mouth daily.     Propylene Glycol (SYSTANE COMPLETE OP) Apply to eye 2 (two) times daily. 1 drop in both eyes in the morning and evening     rosuvastatin (CRESTOR) 40 MG tablet Take 1 tablet (40 mg total) by mouth daily. 90 tablet 3   No current facility-administered medications on file prior to visit.    Observations/Objective: NAD. Speaking clearly.  Work of breathing normal.  Alert and oriented. Mood appropriate.   Assessment and Plan: 1. Encounter to establish care Reviewed patient's PMH, social history, surgical history, and medications.  Is overdue for annual exam, screening blood work, and health maintenance topics.  Have asked patient to return for visit to address these items.   2. Prediabetes Will monitor A1c.   3. Hypothyroidism, unspecified type Will need to check TSH as last monitored in Aug 2020. Continue Synthroid at current dose.    Follow Up Instructions: Annual exam    I discussed the assessment and treatment plan with the patient. The patient was provided an opportunity to ask questions and all were answered. The patient agreed with the plan and demonstrated an understanding of the instructions.   The patient was advised to call back or seek an in-person evaluation if the symptoms  worsen or if the condition fails to improve as anticipated.     I provided 7 minutes total of non-face-to-face time during this encounter including median intraservice time, reviewing previous notes, investigations, ordering medications, medical decision making, coordinating care and patient verbalized understanding at the end of the visit.    Phill Myron, D.O. Primary Care at [facility]  07/02/2020, 2:49 PM

## 2020-07-02 NOTE — Progress Notes (Signed)
Discuss Oxybutynin- no longer effective

## 2020-07-28 ENCOUNTER — Other Ambulatory Visit: Payer: Self-pay | Admitting: Nurse Practitioner

## 2020-07-29 ENCOUNTER — Other Ambulatory Visit: Payer: Self-pay | Admitting: Internal Medicine

## 2020-08-12 ENCOUNTER — Telehealth: Payer: Self-pay | Admitting: General Practice

## 2020-08-12 ENCOUNTER — Other Ambulatory Visit: Payer: Self-pay | Admitting: Internal Medicine

## 2020-08-12 ENCOUNTER — Other Ambulatory Visit: Payer: Self-pay | Admitting: Nurse Practitioner

## 2020-08-12 DIAGNOSIS — E782 Mixed hyperlipidemia: Secondary | ICD-10-CM

## 2020-08-12 DIAGNOSIS — I10 Essential (primary) hypertension: Secondary | ICD-10-CM

## 2020-08-12 MED ORDER — LEVOTHYROXINE SODIUM 125 MCG PO TABS: 125.0000 ug | ORAL_TABLET | Freq: Every day | ORAL | 1 refills | Status: DC

## 2020-08-12 MED ORDER — LOSARTAN POTASSIUM 100 MG PO TABS: 100.0000 mg | ORAL_TABLET | Freq: Every day | ORAL | 1 refills | Status: DC

## 2020-08-12 NOTE — Telephone Encounter (Signed)
Refills sent.   Phill Myron, D.O. Primary Care at [facility]  08/12/2020, 2:49 PM

## 2020-08-12 NOTE — Telephone Encounter (Signed)
Pt had virtual with Dr. Juleen China. Will establish care next week. Pt requesting refills: Levothyroxine 125 mcg & Losartan Potassium 100 mg. [facility] [address]

## 2020-08-14 ENCOUNTER — Other Ambulatory Visit: Payer: Self-pay | Admitting: Internal Medicine

## 2020-08-14 DIAGNOSIS — I10 Essential (primary) hypertension: Secondary | ICD-10-CM

## 2020-08-19 ENCOUNTER — Encounter: Payer: Self-pay | Admitting: Internal Medicine

## 2020-08-19 ENCOUNTER — Ambulatory Visit (INDEPENDENT_AMBULATORY_CARE_PROVIDER_SITE_OTHER): Payer: No Typology Code available for payment source | Admitting: Internal Medicine

## 2020-08-19 ENCOUNTER — Other Ambulatory Visit: Payer: Self-pay

## 2020-08-19 VITALS — BP 141/88 | HR 70 | Temp 97.2°F | Resp 16 | Ht 68.0 in | Wt 219.8 lb

## 2020-08-19 DIAGNOSIS — E785 Hyperlipidemia, unspecified: Secondary | ICD-10-CM

## 2020-08-19 DIAGNOSIS — R7303 Prediabetes: Secondary | ICD-10-CM

## 2020-08-19 DIAGNOSIS — Z130 Encounter for screening for diseases of the blood and blood-forming organs and certain disorders involving the immune mechanism: Secondary | ICD-10-CM

## 2020-08-19 DIAGNOSIS — E039 Hypothyroidism, unspecified: Secondary | ICD-10-CM

## 2020-08-19 DIAGNOSIS — Z0000 Encounter for general adult medical examination without abnormal findings: Secondary | ICD-10-CM | POA: Diagnosis not present

## 2020-08-19 MED ORDER — VALSARTAN 80 MG PO TABS: 80.0000 mg | ORAL_TABLET | Freq: Every day | ORAL | 1 refills | Status: DC

## 2020-08-19 NOTE — Patient Instructions (Signed)
Preventive Care 60-60 Years Old, Female Preventive care refers to lifestyle choices and visits with your health care provider that can promote health and wellness. This includes:  A yearly physical exam. This is also called an annual wellness visit.  Regular dental and eye exams.  Immunizations.  Screening for certain conditions.  Healthy lifestyle choices, such as: ? Eating a healthy diet. ? Getting regular exercise. ? Not using drugs or products that contain nicotine and tobacco. ? Limiting alcohol use. What can I expect for my preventive care visit? Physical exam Your health care provider will check your:  Height and weight. These may be used to calculate your BMI (body mass index). BMI is a measurement that tells if you are at a healthy weight.  Heart rate and blood pressure.  Body temperature.  Skin for abnormal spots. Counseling Your health care provider may ask you questions about your:  Past medical problems.  Family's medical history.  Alcohol, tobacco, and drug use.  Emotional well-being.  Home life and relationship well-being.  Sexual activity.  Diet, exercise, and sleep habits.  Work and work Statistician.  Access to firearms.  Method of birth control.  Menstrual cycle.  Pregnancy history. What immunizations do I need? Vaccines are usually given at various ages, according to a schedule. Your health care provider will recommend vaccines for you based on your age, medical history, and lifestyle or other factors, such as travel or where you work.   What tests do I need? Blood tests  Lipid and cholesterol levels. These may be checked every 5 years, or more often if you are over 3 years old.  Hepatitis C test.  Hepatitis B test. Screening  Lung cancer screening. You may have this screening every year starting at age 73 if you have a 30-pack-year history of smoking and currently smoke or have quit within the past 15 years.  Colorectal cancer  screening. ? All adults should have this screening starting at age 52 and continuing until age 17. ? Your health care provider may recommend screening at age 49 if you are at increased risk. ? You will have tests every 1-10 years, depending on your results and the type of screening test.  Diabetes screening. ? This is done by checking your blood sugar (glucose) after you have not eaten for a while (fasting). ? You may have this done every 1-3 years.  Mammogram. ? This may be done every 1-2 years. ? Talk with your health care provider about when you should start having regular mammograms. This may depend on whether you have a family history of breast cancer.  BRCA-related cancer screening. This may be done if you have a family history of breast, ovarian, tubal, or peritoneal cancers.  Pelvic exam and Pap test. ? This may be done every 3 years starting at age 10. ? Starting at age 11, this may be done every 5 years if you have a Pap test in combination with an HPV test. Other tests  STD (sexually transmitted disease) testing, if you are at risk.  Bone density scan. This is done to screen for osteoporosis. You may have this scan if you are at high risk for osteoporosis. Talk with your health care provider about your test results, treatment options, and if necessary, the need for more tests. Follow these instructions at home: Eating and drinking  Eat a diet that includes fresh fruits and vegetables, whole grains, lean protein, and low-fat dairy products.  Take vitamin and mineral supplements  as recommended by your health care provider.  Do not drink alcohol if: ? Your health care provider tells you not to drink. ? You are pregnant, may be pregnant, or are planning to become pregnant.  If you drink alcohol: ? Limit how much you have to 0-1 drink a day. ? Be aware of how much alcohol is in your drink. In the [place] one drink equals one 12 oz bottle of beer (355 mL), one 5 oz glass of  wine (148 mL), or one 1 oz glass of hard liquor (44 mL).   Lifestyle  Take daily care of your teeth and gums. Brush your teeth every morning and night with fluoride toothpaste. Floss one time each day.  Stay active. Exercise for at least 30 minutes 5 or more days each week.  Do not use any products that contain nicotine or tobacco, such as cigarettes, e-cigarettes, and chewing tobacco. If you need help quitting, ask your health care provider.  Do not use drugs.  If you are sexually active, practice safe sex. Use a condom or other form of protection to prevent STIs (sexually transmitted infections).  If you do not wish to become pregnant, use a form of birth control. If you plan to become pregnant, see your health care provider for a prepregnancy visit.  If told by your health care provider, take low-dose aspirin daily starting at age 50.  Find healthy ways to cope with stress, such as: ? Meditation, yoga, or listening to music. ? Journaling. ? Talking to a trusted person. ? Spending time with friends and family. Safety  Always wear your seat belt while driving or riding in a vehicle.  Do not drive: ? If you have been drinking alcohol. Do not ride with someone who has been drinking. ? When you are tired or distracted. ? While texting.  Wear a helmet and other protective equipment during sports activities.  If you have firearms in your house, make sure you follow all gun safety procedures. What's next?  Visit your health care provider once a year for an annual wellness visit.  Ask your health care provider how often you should have your eyes and teeth checked.  Stay up to date on all vaccines. This information is not intended to replace advice given to you by your health care provider. Make sure you discuss any questions you have with your health care provider. Document Revised: 02/13/2020 Document Reviewed: 01/20/2018 Elsevier Patient Education  2021 Elsevier Inc.  

## 2020-08-19 NOTE — Progress Notes (Signed)
  Subjective:    Adriana Young - 60 y.o. female MRN [id number]  Date of birth: 02/22/1961  HPI  Adriana Young is here for annual exam. No concerns today.      Health Maintenance:  There are no preventive care reminders to display for this patient.  -  reports that she quit smoking about 20 years ago. Her smoking use included cigarettes. She has a 25.00 pack-year smoking history. She has never used smokeless tobacco. - Review of Systems: Per HPI. - Past Medical History: Patient Active Problem List   Diagnosis Date Noted  . Degenerative joint disease (DJD) of hip 10/14/2015  . Fatty liver 09/19/2010  . Hypothyroidism 06/18/2008  . Hyperlipidemia with target LDL less than 100 06/04/2008  . Essential hypertension 06/04/2008  . GERD 06/04/2008  . Generalized OA 06/04/2008   - Medications: reviewed and updated   Objective:   Physical Exam BP (!) 141/88   Pulse 70   Temp (!) 97.2 F (36.2 C)   Resp 16   Ht 5\' 8"  (1.727 m)   Wt 219 lb 12.8 oz (99.7 kg)   LMP 10/25/2014   SpO2 97%   BMI 33.42 kg/m  Physical Exam Constitutional:      Appearance: She is not diaphoretic.  HENT:     Head: Normocephalic and atraumatic.  Eyes:     Conjunctiva/sclera: Conjunctivae normal.     Pupils: Pupils are equal, round, and reactive to light.  Neck:     Thyroid: No thyromegaly.  Cardiovascular:     Rate and Rhythm: Normal rate and regular rhythm.     Heart sounds: Normal heart sounds. No murmur heard.   Pulmonary:     Effort: Pulmonary effort is normal. No respiratory distress.     Breath sounds: Normal breath sounds. No wheezing.  Abdominal:     General: Bowel sounds are normal. There is no distension.     Palpations: Abdomen is soft.     Tenderness: There is no abdominal tenderness. There is no guarding or rebound.  Musculoskeletal:        General: No deformity. Normal range of motion.     Cervical back: Normal range of motion and neck supple.  Lymphadenopathy:     Cervical: No  cervical adenopathy.  Skin:    General: Skin is warm and dry.     Findings: No rash.  Neurological:     Mental Status: She is alert and oriented to person, place, and time.     Gait: Gait is intact.  Psychiatric:        Mood and Affect: Mood and affect normal.        Judgment: Judgment normal.            Assessment & Plan:   1. Encounter for annual physical exam Counseled on 150 minutes of exercise per week, healthy eating (including decreased daily intake of saturated fats, cholesterol, added sugars, sodium), STI prevention, routine healthcare maintenance.  2. Acquired hypothyroidism On Synthroid 125 mcg. Last TSH within range but collected Aug 2020.  - TSH  3. Prediabetes A1c 5.9% in Jan 2021. Will monitor.  - Hemoglobin A1c - Comprehensive metabolic panel  4. Hyperlipidemia with target LDL less than 100 On Crestor 40 mg. Monitor lipid panel.  - Comprehensive metabolic panel - Lipid panel  5. Screening for deficiency anemia - CBC  Phill Myron, D.O. 08/19/2020, 8:54 AM Primary Care at [facility]

## 2020-08-19 NOTE — Telephone Encounter (Signed)
Patients strengths are all on back order. Please advise pharmacy on alternate.

## 2020-08-19 NOTE — Addendum Note (Signed)
Addended by: Melina Schools on: 08/19/2020 09:33 AM   Modules accepted: Orders

## 2020-08-20 LAB — CBC
Hematocrit: 46.9 % — ABNORMAL HIGH (ref 34.0–46.6)
Hemoglobin: 15.9 g/dL (ref 11.1–15.9)
MCH: 31.3 pg (ref 26.6–33.0)
MCHC: 33.9 g/dL (ref 31.5–35.7)
MCV: 92 fL (ref 79–97)
Platelets: 203 10*3/uL (ref 150–450)
RBC: 5.08 x10E6/uL (ref 3.77–5.28)
RDW: 12.7 % (ref 11.7–15.4)
WBC: 6.7 10*3/uL (ref 3.4–10.8)

## 2020-08-20 LAB — COMPREHENSIVE METABOLIC PANEL
ALT: 24 IU/L (ref 0–32)
AST: 26 IU/L (ref 0–40)
Albumin/Globulin Ratio: 1.9 (ref 1.2–2.2)
Albumin: 4.4 g/dL (ref 3.8–4.9)
Alkaline Phosphatase: 72 IU/L (ref 44–121)
BUN/Creatinine Ratio: 24 (ref 12–28)
BUN: 21 mg/dL (ref 8–27)
Bilirubin Total: 0.3 mg/dL (ref 0.0–1.2)
CO2: 23 mmol/L (ref 20–29)
Calcium: 9.6 mg/dL (ref 8.7–10.3)
Chloride: 101 mmol/L (ref 96–106)
Creatinine, Ser: 0.88 mg/dL (ref 0.57–1.00)
Globulin, Total: 2.3 g/dL (ref 1.5–4.5)
Glucose: 114 mg/dL — ABNORMAL HIGH (ref 65–99)
Potassium: 4 mmol/L (ref 3.5–5.2)
Sodium: 138 mmol/L (ref 134–144)
Total Protein: 6.7 g/dL (ref 6.0–8.5)
eGFR: 75 mL/min/{1.73_m2} (ref >59)

## 2020-08-20 LAB — LIPID PANEL
Chol/HDL Ratio: 3.5 ratio (ref 0.0–4.4)
Cholesterol, Total: 182 mg/dL (ref 100–199)
HDL: 52 mg/dL (ref >39)
LDL Chol Calc (NIH): 106 mg/dL — ABNORMAL HIGH (ref 0–99)
Triglycerides: 136 mg/dL (ref 0–149)
VLDL Cholesterol Cal: 24 mg/dL (ref 5–40)

## 2020-08-20 LAB — TSH: TSH: 1.56 u[IU]/mL (ref 0.450–4.500)

## 2020-08-20 LAB — HEMOGLOBIN A1C
Est. average glucose Bld gHb Est-mCnc: 123 mg/dL
Hgb A1c MFr Bld: 5.9 % — ABNORMAL HIGH (ref 4.8–5.6)

## 2020-09-11 ENCOUNTER — Telehealth: Payer: Self-pay | Admitting: Internal Medicine

## 2020-09-11 DIAGNOSIS — E782 Mixed hyperlipidemia: Secondary | ICD-10-CM

## 2020-09-11 MED ORDER — ROSUVASTATIN CALCIUM 40 MG PO TABS: 40.0000 mg | ORAL_TABLET | Freq: Every day | ORAL | 3 refills | Status: DC

## 2020-09-11 NOTE — Telephone Encounter (Signed)
Pt came in the office and would like a refill on this medication rosuvastatin (CRESTOR) 40 MG tablet [[id number]]. Pt states she only has one pill left. Please follow up with pt.

## 2020-11-12 ENCOUNTER — Other Ambulatory Visit: Payer: Self-pay | Admitting: Nurse Practitioner

## 2020-11-12 DIAGNOSIS — I10 Essential (primary) hypertension: Secondary | ICD-10-CM

## 2020-11-19 ENCOUNTER — Ambulatory Visit: Payer: No Typology Code available for payment source | Admitting: Internal Medicine

## 2020-11-19 NOTE — Progress Notes (Signed)
Patient ID: Adriana Young, female    DOB: Aug 03, 1960  MRN: [id number]  CC: Hypertension Follow-Up   Subjective: Adriana Young is a 60 y.o. female who presents for hypertension follow-up.   Her concerns today include:   HYPERTENSION: Currently taking: see medication list Med Adherence: [x]  Yes    []  No Medication side effects: []  Yes    [x]  No Adherence with salt restriction (low-salt diet): [x]  Yes   []  No Exercise: trying  Home Monitoring?: [x]  Yes    []  No SOB? []  Yes    [x]  No Chest Pain?: []  Yes    [x]  No  2. COUGH: Began early June and recently becoming more persistent. Cough productive with mucus. Denies coughing up blood and shortness of breath. History of bronchitis but doesn't feel similar to past experiences. Reports the cough does not happen everyday. Sometimes may go a few days without cough and then returns. Using over-the-counter decongestant and Allegra.    Patient Active Problem List   Diagnosis Date Noted   Degenerative joint disease (DJD) of hip 10/14/2015   Fatty liver 09/19/2010   Hypothyroidism 06/18/2008   Hyperlipidemia with target LDL less than 100 06/04/2008   Essential hypertension 06/04/2008   GERD 06/04/2008   Generalized OA 06/04/2008     Current Outpatient Medications on File Prior to Visit  Medication Sig Dispense Refill   acetaminophen (TYLENOL) 650 MG CR tablet Take 1,300 mg by mouth 2 (two) times daily.      diazepam (VALIUM) 5 MG tablet 1 tablet by mouth prior to dental appointment, if surgery not prior to cleanings  0   fexofenadine (ALLEGRA) 180 MG tablet Take 180 mg by mouth daily as needed for allergies or rhinitis.     gabapentin (NEURONTIN) 100 MG capsule TAKE 1 CAPSULE BY MOUTH THREE TIMES A DAY AS NEEDED 90 capsule 3   levothyroxine (SYNTHROID) 125 MCG tablet Take 1 tablet (125 mcg total) by mouth daily. 90 tablet 1   Multiple Vitamins-Minerals ([facility] SPECTRAVITE PO) Take 1 tablet by mouth daily.     omeprazole (PRILOSEC) 40 MG capsule  TAKE 1 CAPSULE BY MOUTH EVERY DAY (BE SURE TO TAKE 1/2 HOUR BEFORE MEAL) 90 capsule 3   oxybutynin (DITROPAN) 5 MG tablet Take 5 mg by mouth 2 (two) times daily.  12   Probiotic Product (PROBIOTIC DAILY PO) Take 1 capsule by mouth daily.     Propylene Glycol (SYSTANE COMPLETE OP) Apply to eye 2 (two) times daily. 1 drop in both eyes in the morning and evening     rosuvastatin (CRESTOR) 40 MG tablet Take 1 tablet (40 mg total) by mouth daily. 90 tablet 3   No current facility-administered medications on file prior to visit.    Allergies  Allergen Reactions   Other Hives, Shortness Of Breath, Itching, Swelling and Rash    Bee stings    Bee Venom    Niacin And Related    Poison Ivy Extract     Social History   Socioeconomic History   Marital status: Married    Spouse name: Not on file   Number of children: Not on file   Years of education: Not on file   Highest education level: Not on file  Occupational History   Not on file  Tobacco Use   Smoking status: Former    Packs/day: 1.00    Years: 25.00    Pack years: 25.00    Types: Cigarettes    Quit date: 05/25/2000  Years since quitting: 20.5   Smokeless tobacco: Never  Vaping Use   Vaping Use: Some days   Start date: 11/26/2018  Substance and Sexual Activity   Alcohol use: No   Drug use: No   Sexual activity: Yes  Other Topics Concern   Not on file  Social History Narrative   Social History      Diet? Just watching- cutting out bread, drinking water      Do you drink/eat things with caffeine? yes      Marital status?        married                            What year were you married? 1980      Do you live in a house, apartment, assisted living, condo, trailer, etc.? house      Is it one or more stories? one      How many persons live in your home? 3      Do you have any pets in your home? (please list) yes 2 dogs, 3 cats, 3 fish      Highest level of education completed? 2 year college      Current or past  profession:      Do you exercise?         yes                             Type & how often? gym      Advanced Directives      Do you have a living will? yes      Do you have a DNR form?                                  If not, do you want to discuss one? no      Do you have signed POA/HPOA for forms? yes      Functional Status      Do you have difficulty bathing or dressing yourself? no      Do you have difficulty preparing food or eating? no      Do you have difficulty managing your medications? no      Do you have difficulty managing your finances? no      Do you have difficulty affording your medications? no   Social Determinants of Health   Financial Resource Strain: Not on file  Food Insecurity: Not on file  Transportation Needs: Not on file  Physical Activity: Not on file  Stress: Not on file  Social Connections: Not on file  Intimate Partner Violence: Not on file    Family History  Problem Relation Age of Onset   Heart disease Mother    Diabetes Mother    Hypertension Mother    Alcohol abuse Other    Arthritis Other    Hyperlipidemia Other    Hypertension Other    Stroke Other        Female 1st degree relative < 60   Diabetes Maternal Grandmother    Colon cancer Neg Hx    Esophageal cancer Neg Hx    Stomach cancer Neg Hx    Rectal cancer Neg Hx     Past Surgical History:  Procedure Laterality Date   BREAST SURGERY     Lumpectomy- left breast,  no lymph node involvement    ROS: Review of Systems Negative except as stated above  PHYSICAL EXAM: BP 136/87 (BP Location: Left Arm, Patient Position: Sitting, Cuff Size: Large)   Pulse 66   Temp 98.1 F (36.7 C)   Resp 16   Ht 5' 9.02" (1.753 m)   Wt 223 lb 12.8 oz (101.5 kg)   LMP 10/25/2014   SpO2 98%   BMI 33.03 kg/m   Physical Exam HENT:     Head: Normocephalic and atraumatic.  Eyes:     Extraocular Movements: Extraocular movements intact.     Conjunctiva/sclera: Conjunctivae normal.      Pupils: Pupils are equal, round, and reactive to light.  Cardiovascular:     Rate and Rhythm: Normal rate and regular rhythm.  Musculoskeletal:     Cervical back: Normal range of motion and neck supple.  Neurological:     Mental Status: She is alert.   ASSESSMENT AND PLAN: 1. Essential hypertension: - Blood pressure close to goal during today's visit.  - Continue Hydrochlorothiazide and Valsartan as prescribed.  - Counseled on blood pressure goal of less than 130/80, low-sodium, DASH diet, medication compliance, 150 minutes of moderate intensity exercise per week as tolerated. Discussed medication compliance, adverse effects. - Follow-up with primary provider in 3 months or sooner if needed.  - hydrochlorothiazide (HYDRODIURIL) 25 MG tablet; Take 1 tablet (25 mg total) by mouth daily.  Dispense: 90 tablet; Refill: 0 - valsartan (DIOVAN) 80 MG tablet; Take 1 tablet (80 mg total) by mouth daily.  Dispense: 90 tablet; Refill: 0  2. Cough: - Guaifenesin and Benzonatate capsules as prescribed.  - Patient was given clear instructions to go to Emergency Department or return to medical center if symptoms don't improve, worsen, or new problems develop.The patient verbalized understanding. - Follow-up with primary provider as scheduled.  - guaiFENesin 200 MG tablet; Take 1 tablet (200 mg total) by mouth every 4 (four) hours as needed for cough or to loosen phlegm.  Dispense: 30 tablet; Refill: 0 - benzonatate (TESSALON) 100 MG capsule; Take 1 capsule (100 mg total) by mouth 2 (two) times daily as needed for cough.  Dispense: 20 capsule; Refill: 0   Patient was given the opportunity to ask questions.  Patient verbalized understanding of the plan and was able to repeat key elements of the plan. Patient was given clear instructions to go to Emergency Department or return to medical center if symptoms don't improve, worsen, or new problems develop.The patient verbalized understanding.   Requested  Prescriptions   Signed Prescriptions Disp Refills   guaiFENesin 200 MG tablet 30 tablet 0    Sig: Take 1 tablet (200 mg total) by mouth every 4 (four) hours as needed for cough or to loosen phlegm.   benzonatate (TESSALON) 100 MG capsule 20 capsule 0    Sig: Take 1 capsule (100 mg total) by mouth 2 (two) times daily as needed for cough.   hydrochlorothiazide (HYDRODIURIL) 25 MG tablet 90 tablet 0    Sig: Take 1 tablet (25 mg total) by mouth daily.   valsartan (DIOVAN) 80 MG tablet 90 tablet 0    Sig: Take 1 tablet (80 mg total) by mouth daily.    Return in about 3 months (around 02/20/2021) for Follow-Up hypertension.  Camillia Herter, NP

## 2020-11-20 ENCOUNTER — Ambulatory Visit (INDEPENDENT_AMBULATORY_CARE_PROVIDER_SITE_OTHER): Payer: No Typology Code available for payment source | Admitting: Family

## 2020-11-20 ENCOUNTER — Other Ambulatory Visit: Payer: Self-pay

## 2020-11-20 ENCOUNTER — Encounter: Payer: Self-pay | Admitting: Family

## 2020-11-20 VITALS — BP 136/87 | HR 66 | Temp 98.1°F | Resp 16 | Ht 69.02 in | Wt 223.8 lb

## 2020-11-20 DIAGNOSIS — R059 Cough, unspecified: Secondary | ICD-10-CM

## 2020-11-20 DIAGNOSIS — I10 Essential (primary) hypertension: Secondary | ICD-10-CM

## 2020-11-20 MED ORDER — BENZONATATE 100 MG PO CAPS: 100.0000 mg | ORAL_CAPSULE | Freq: Two times a day (BID) | ORAL | 0 refills | Status: DC | PRN

## 2020-11-20 MED ORDER — VALSARTAN 80 MG PO TABS: 80.0000 mg | ORAL_TABLET | Freq: Every day | ORAL | 0 refills | Status: DC

## 2020-11-20 MED ORDER — GUAIFENESIN 200 MG PO TABS: 200.0000 mg | ORAL_TABLET | ORAL | 0 refills | Status: DC | PRN

## 2020-11-20 MED ORDER — HYDROCHLOROTHIAZIDE 25 MG PO TABS: 25.0000 mg | ORAL_TABLET | Freq: Every day | ORAL | 0 refills | Status: DC

## 2020-11-20 NOTE — Progress Notes (Signed)
Pt presents for hypertension f/u, pt stated that since beginning of month has a cough w/yellowish mucous

## 2021-02-14 ENCOUNTER — Other Ambulatory Visit: Payer: Self-pay | Admitting: Nurse Practitioner

## 2021-02-14 DIAGNOSIS — K219 Gastro-esophageal reflux disease without esophagitis: Secondary | ICD-10-CM

## 2021-02-17 NOTE — Progress Notes (Signed)
Patient ID: Adriana Young, female    DOB: 02-25-1961  MRN: [id number]  CC: Hypertension Follow-Up  Subjective: Adriana Young is a 60 y.o. female who presents for hypertension follow-up.   Her concerns today include:   HYPERTENSION FOLLOW-UP: 11/20/2020: - Continue Hydrochlorothiazide and Valsartan as prescribed.   02/21/2021: Doing well on current regimen. No side effects. No issues/concerns. Denies chest pain and shortness of breath. Reports home blood pressure monitor may be wrong usually 130's/90's.   2. GERD FOLLOW-UP: Requesting refills of Omeprazole.   3. URINARY FREQUENCY: Began 3 days ago with odor. Denies any red flag symptoms. Reports symptoms happen about every 3 to 4 months.   Patient Active Problem List   Diagnosis Date Noted   Degenerative joint disease (DJD) of hip 10/14/2015   Fatty liver 09/19/2010   Hypothyroidism 06/18/2008   Hyperlipidemia with target LDL less than 100 06/04/2008   Essential hypertension 06/04/2008   GERD 06/04/2008   Generalized OA 06/04/2008     Current Outpatient Medications on File Prior to Visit  Medication Sig Dispense Refill   acetaminophen (TYLENOL) 650 MG CR tablet Take 1,300 mg by mouth 2 (two) times daily.      diazepam (VALIUM) 5 MG tablet 1 tablet by mouth prior to dental appointment, if surgery not prior to cleanings  0   gabapentin (NEURONTIN) 100 MG capsule TAKE 1 CAPSULE BY MOUTH THREE TIMES A DAY AS NEEDED 90 capsule 3   hydrochlorothiazide (HYDRODIURIL) 25 MG tablet Take 1 tablet (25 mg total) by mouth daily. 90 tablet 0   levothyroxine (SYNTHROID) 125 MCG tablet Take 1 tablet (125 mcg total) by mouth daily. 90 tablet 1   Multiple Vitamins-Minerals ([facility] SPECTRAVITE PO) Take 1 tablet by mouth daily.     omeprazole (PRILOSEC) 40 MG capsule TAKE 1 CAPSULE BY MOUTH EVERY DAY (BE SURE TO TAKE 1/2 HOUR BEFORE MEAL) 90 capsule 3   oxybutynin (DITROPAN) 5 MG tablet Take 5 mg by mouth 2 (two) times daily.  12   Probiotic Product  (PROBIOTIC DAILY PO) Take 1 capsule by mouth daily.     rosuvastatin (CRESTOR) 40 MG tablet Take 1 tablet (40 mg total) by mouth daily. 90 tablet 3   valsartan (DIOVAN) 80 MG tablet Take 1 tablet (80 mg total) by mouth daily. 90 tablet 0   No current facility-administered medications on file prior to visit.    Allergies  Allergen Reactions   Bee Venom Anaphylaxis   Other Hives, Shortness Of Breath, Itching, Swelling and Rash    Bee stings    Poison Ivy Extract Rash    Social History   Socioeconomic History   Marital status: Married    Spouse name: Not on file   Number of children: Not on file   Years of education: Not on file   Highest education level: Not on file  Occupational History   Not on file  Tobacco Use   Smoking status: Former    Packs/day: 1.00    Years: 25.00    Pack years: 25.00    Types: Cigarettes    Quit date: 05/25/2000    Years since quitting: 20.7   Smokeless tobacco: Never  Vaping Use   Vaping Use: Some days   Start date: 11/26/2018  Substance and Sexual Activity   Alcohol use: No   Drug use: No   Sexual activity: Yes  Other Topics Concern   Not on file  Social History Narrative   Social History  Diet? Just watching- cutting out bread, drinking water      Do you drink/eat things with caffeine? yes      Marital status?        married                            What year were you married? 1980      Do you live in a house, apartment, assisted living, condo, trailer, etc.? house      Is it one or more stories? one      How many persons live in your home? 3      Do you have any pets in your home? (please list) yes 2 dogs, 3 cats, 3 fish      Highest level of education completed? 2 year college      Current or past profession:      Do you exercise?         yes                             Type & how often? gym      Advanced Directives      Do you have a living will? yes      Do you have a DNR form?                                  If  not, do you want to discuss one? no      Do you have signed POA/HPOA for forms? yes      Functional Status      Do you have difficulty bathing or dressing yourself? no      Do you have difficulty preparing food or eating? no      Do you have difficulty managing your medications? no      Do you have difficulty managing your finances? no      Do you have difficulty affording your medications? no   Social Determinants of Health   Financial Resource Strain: Not on file  Food Insecurity: Not on file  Transportation Needs: Not on file  Physical Activity: Not on file  Stress: Not on file  Social Connections: Not on file  Intimate Partner Violence: Not on file    Family History  Problem Relation Age of Onset   Heart disease Mother    Diabetes Mother    Hypertension Mother    Alcohol abuse Other    Arthritis Other    Hyperlipidemia Other    Hypertension Other    Stroke Other        Female 1st degree relative < 60   Diabetes Maternal Grandmother    Colon cancer Neg Hx    Esophageal cancer Neg Hx    Stomach cancer Neg Hx    Rectal cancer Neg Hx     Past Surgical History:  Procedure Laterality Date   BREAST SURGERY     Lumpectomy- left breast, no lymph node involvement    ROS: Review of Systems Negative except as stated above  PHYSICAL EXAM: BP 118/81 (BP Location: Left Arm, Patient Position: Sitting, Cuff Size: Large)   Pulse 65   Temp 98.3 F (36.8 C)   Resp 16   Ht 5' 9.02" (1.753 m)   Wt 215 lb (97.5 kg)   LMP 10/25/2014  SpO2 98%   BMI 31.74 kg/m   Physical Exam HENT:     Head: Normocephalic and atraumatic.  Eyes:     Extraocular Movements: Extraocular movements intact.     Conjunctiva/sclera: Conjunctivae normal.     Pupils: Pupils are equal, round, and reactive to light.  Cardiovascular:     Rate and Rhythm: Normal rate and regular rhythm.     Pulses: Normal pulses.     Heart sounds: Normal heart sounds.  Pulmonary:     Effort: Pulmonary  effort is normal.     Breath sounds: Normal breath sounds.  Musculoskeletal:     Cervical back: Normal range of motion and neck supple.  Neurological:     General: No focal deficit present.     Mental Status: She is alert and oriented to person, place, and time.  Psychiatric:        Mood and Affect: Mood normal.        Behavior: Behavior normal.    ASSESSMENT AND PLAN: 1. Essential hypertension: - Continue Hydrochlorothiazide and Valsartan as prescribed.  - Counseled on blood pressure goal of less than 130/80, low-sodium, DASH diet, medication compliance, 150 minutes of moderate intensity exercise per week as tolerated. Discussed medication compliance, adverse effects. - BMP to evaluate kidney function and electrolyte balance. - Follow-up with primary provider in 3 months or sooner if needed. - Basic Metabolic Panel - hydrochlorothiazide (HYDRODIURIL) 25 MG tablet; Take 1 tablet (25 mg total) by mouth daily.  Dispense: 90 tablet; Refill: 0 - valsartan (DIOVAN) 80 MG tablet; Take 1 tablet (80 mg total) by mouth daily.  Dispense: 90 tablet; Refill: 0  2. Gastroesophageal reflux disease: - Continue Omeprazole as prescribed.  - Follow-up with primary provider as scheduled.  - omeprazole (PRILOSEC) 40 MG capsule; TAKE 1 CAPSULE BY MOUTH EVERY DAY (BE SURE TO TAKE 1/2 HOUR BEFORE MEAL)  Dispense: 90 capsule; Refill: 0  3. Urinary frequency: - Urinalysis with small leukocytes, sending urine culture for further evaluation.  - Cervicovaginal self-swab to screen for chlamydia, gonorrhea, trichomonas, bacterial vaginitis, and candida vaginitis. - Follow-up with primary provider as scheduled. - POCT URINALYSIS DIP (CLINITEK) - Cervicovaginal ancillary only - Urine Culture   Patient was given the opportunity to ask questions.  Patient verbalized understanding of the plan and was able to repeat key elements of the plan. Patient was given clear instructions to go to Emergency Department or  return to medical center if symptoms don't improve, worsen, or new problems develop.The patient verbalized understanding.   Orders Placed This Encounter  Procedures   Urine Culture   Basic Metabolic Panel   POCT URINALYSIS DIP (CLINITEK)     Requested Prescriptions   Pending Prescriptions Disp Refills   omeprazole (PRILOSEC) 40 MG capsule 90 capsule 0    Sig: TAKE 1 CAPSULE BY MOUTH EVERY DAY (BE SURE TO TAKE 1/2 HOUR BEFORE MEAL)   hydrochlorothiazide (HYDRODIURIL) 25 MG tablet 90 tablet 0    Sig: Take 1 tablet (25 mg total) by mouth daily.   valsartan (DIOVAN) 80 MG tablet 90 tablet 0    Sig: Take 1 tablet (80 mg total) by mouth daily.    Return in about 3 months (around 05/23/2021) for Follow-Up or next available hypertension.  Camillia Herter, NP

## 2021-02-21 ENCOUNTER — Ambulatory Visit (INDEPENDENT_AMBULATORY_CARE_PROVIDER_SITE_OTHER): Payer: No Typology Code available for payment source | Admitting: Family

## 2021-02-21 ENCOUNTER — Other Ambulatory Visit (HOSPITAL_COMMUNITY)
Admission: RE | Admit: 2021-02-21 | Discharge: 2021-02-21 | Disposition: A | Discharge status: Home / Self Care | Payer: No Typology Code available for payment source | Source: Ambulatory Visit | Attending: Family | Admitting: Family

## 2021-02-21 ENCOUNTER — Other Ambulatory Visit: Payer: Self-pay

## 2021-02-21 VITALS — BP 118/81 | HR 65 | Temp 98.3°F | Resp 16 | Ht 69.02 in | Wt 215.0 lb

## 2021-02-21 DIAGNOSIS — I10 Essential (primary) hypertension: Secondary | ICD-10-CM

## 2021-02-21 DIAGNOSIS — B9689 Other specified bacterial agents as the cause of diseases classified elsewhere: Secondary | ICD-10-CM | POA: Diagnosis not present

## 2021-02-21 DIAGNOSIS — K219 Gastro-esophageal reflux disease without esophagitis: Secondary | ICD-10-CM

## 2021-02-21 DIAGNOSIS — R350 Frequency of micturition: Secondary | ICD-10-CM

## 2021-02-21 DIAGNOSIS — Z113 Encounter for screening for infections with a predominantly sexual mode of transmission: Secondary | ICD-10-CM | POA: Diagnosis not present

## 2021-02-21 DIAGNOSIS — N760 Acute vaginitis: Secondary | ICD-10-CM | POA: Insufficient documentation

## 2021-02-21 LAB — POCT URINALYSIS DIP (CLINITEK)
Bilirubin, UA: NEGATIVE
Glucose, UA: NEGATIVE mg/dL
Ketones, POC UA: NEGATIVE mg/dL
Nitrite, UA: NEGATIVE
POC PROTEIN,UA: NEGATIVE
Spec Grav, UA: >=1.03 — AB (ref 1.010–1.025)
Urobilinogen, UA: 0.2 E.U./dL
pH, UA: 6 (ref 5.0–8.0)

## 2021-02-21 MED ORDER — VALSARTAN 80 MG PO TABS: 80.0000 mg | ORAL_TABLET | Freq: Every day | ORAL | 0 refills | Status: DC

## 2021-02-21 MED ORDER — HYDROCHLOROTHIAZIDE 25 MG PO TABS: 25.0000 mg | ORAL_TABLET | Freq: Every day | ORAL | 0 refills | Status: DC

## 2021-02-21 MED ORDER — OMEPRAZOLE 40 MG PO CPDR: DELAYED_RELEASE_CAPSULE | ORAL | 0 refills | Status: DC

## 2021-02-21 NOTE — Progress Notes (Signed)
Sending urine for culture.

## 2021-02-21 NOTE — Patient Instructions (Signed)

## 2021-02-21 NOTE — Progress Notes (Signed)
Pt presents for hypertension follow, pt reports urinary frequency w/urine odor symptoms started approx 3 days ago

## 2021-02-22 LAB — BASIC METABOLIC PANEL
BUN/Creatinine Ratio: 21 (ref 12–28)
BUN: 23 mg/dL (ref 8–27)
CO2: 23 mmol/L (ref 20–29)
Calcium: 10 mg/dL (ref 8.7–10.3)
Chloride: 101 mmol/L (ref 96–106)
Creatinine, Ser: 1.07 mg/dL — ABNORMAL HIGH (ref 0.57–1.00)
Glucose: 108 mg/dL — ABNORMAL HIGH (ref 70–99)
Potassium: 4.2 mmol/L (ref 3.5–5.2)
Sodium: 141 mmol/L (ref 134–144)
eGFR: 59 mL/min/{1.73_m2} — ABNORMAL LOW (ref >59)

## 2021-02-22 NOTE — Progress Notes (Signed)
Kidney function decreased since 6 months ago. Please call our office and schedule a lab only visit to have rechecked in 4 to 6 weeks.

## 2021-02-24 ENCOUNTER — Other Ambulatory Visit: Payer: Self-pay | Admitting: Family

## 2021-02-24 DIAGNOSIS — B9689 Other specified bacterial agents as the cause of diseases classified elsewhere: Secondary | ICD-10-CM | POA: Insufficient documentation

## 2021-02-24 DIAGNOSIS — K219 Gastro-esophageal reflux disease without esophagitis: Secondary | ICD-10-CM

## 2021-02-24 DIAGNOSIS — N760 Acute vaginitis: Secondary | ICD-10-CM | POA: Insufficient documentation

## 2021-02-24 DIAGNOSIS — I10 Essential (primary) hypertension: Secondary | ICD-10-CM

## 2021-02-24 LAB — CERVICOVAGINAL ANCILLARY ONLY
Bacterial Vaginitis (gardnerella): POSITIVE — AB
Candida Glabrata: NEGATIVE
Candida Vaginitis: NEGATIVE
Chlamydia: NEGATIVE
Comment: NEGATIVE
Comment: NEGATIVE
Comment: NEGATIVE
Comment: NEGATIVE
Comment: NEGATIVE
Comment: NORMAL
Neisseria Gonorrhea: NEGATIVE
Trichomonas: NEGATIVE

## 2021-02-24 MED ORDER — METRONIDAZOLE 500 MG PO TABS: 500.0000 mg | ORAL_TABLET | Freq: Two times a day (BID) | ORAL | 0 refills | Status: AC

## 2021-02-24 NOTE — Progress Notes (Signed)
Gonorrhea, Chlamydia, Trichomonas, and Candida Vaginitis (sometimes called yeast infection) negative.   Positive for Bacterial Vaginitis, an overgrowth of normal bacteria in the vagina due to changes in pH. Prescribed Metronidazole (Flagyl) twice per day for 7 days. Do not drink alcohol while taking this medication.

## 2021-02-25 LAB — URINE CULTURE

## 2021-02-25 NOTE — Progress Notes (Signed)
Urine culture negative.

## 2021-02-28 ENCOUNTER — Other Ambulatory Visit: Payer: Self-pay | Admitting: *Deleted

## 2021-02-28 MED ORDER — LEVOTHYROXINE SODIUM 125 MCG PO TABS: 125.0000 ug | ORAL_TABLET | Freq: Every day | ORAL | 1 refills | Status: DC

## 2021-04-07 ENCOUNTER — Other Ambulatory Visit: Payer: No Typology Code available for payment source

## 2021-04-07 ENCOUNTER — Other Ambulatory Visit: Payer: Self-pay | Admitting: Family

## 2021-04-07 ENCOUNTER — Other Ambulatory Visit: Payer: Self-pay

## 2021-04-07 DIAGNOSIS — I10 Essential (primary) hypertension: Secondary | ICD-10-CM

## 2021-04-07 NOTE — Addendum Note (Signed)
Addended by: Elmon Else on: 04/07/2021 10:33 AM   Modules accepted: Orders

## 2021-04-09 LAB — BASIC METABOLIC PANEL
BUN/Creatinine Ratio: 16 (ref 12–28)
BUN: 16 mg/dL (ref 8–27)
CO2: 23 mmol/L (ref 20–29)
Calcium: 9.5 mg/dL (ref 8.7–10.3)
Chloride: 102 mmol/L (ref 96–106)
Creatinine, Ser: 1.02 mg/dL — ABNORMAL HIGH (ref 0.57–1.00)
Glucose: 89 mg/dL (ref 70–99)
Potassium: 4.2 mmol/L (ref 3.5–5.2)
Sodium: 143 mmol/L (ref 134–144)
eGFR: 63 mL/min/{1.73_m2} (ref >59)

## 2021-04-09 NOTE — Progress Notes (Signed)
Kidney function normal compared to 4 weeks ago.

## 2021-05-02 ENCOUNTER — Other Ambulatory Visit: Payer: Self-pay

## 2021-05-02 ENCOUNTER — Ambulatory Visit
Admission: EM | Admit: 2021-05-02 | Discharge: 2021-05-02 | Disposition: A | Discharge status: Home / Self Care | Payer: No Typology Code available for payment source | Attending: Physician Assistant | Admitting: Physician Assistant

## 2021-05-02 DIAGNOSIS — H1031 Unspecified acute conjunctivitis, right eye: Secondary | ICD-10-CM

## 2021-05-02 MED ORDER — POLYMYXIN B-TRIMETHOPRIM 10000-0.1 UNIT/ML-% OP SOLN: 1.0000 [drp] | OPHTHALMIC | 0 refills | Status: DC

## 2021-05-02 NOTE — ED Triage Notes (Signed)
Pt c/o right eye redness and drainage. Says works at [employer] and yesterday was around [place] home last night without signs. This morning around 10am she noticed it was pink, and within 2 hours has become red and draining.

## 2021-05-02 NOTE — ED Provider Notes (Signed)
Adriana Young    CSN: [id number] Arrival date & time: 05/02/21  1305      History   Chief Complaint Chief Complaint  Patient presents with   right eye situation    HPI Adriana Young is a 60 y.o. female.   Patient here today for evaluation of right erythema and drainage that started today.  She reports that yesterday she was around dust at work but this is typical for her.  She has recently had some upper respiratory symptoms as well.  She denies any fever.  There is no significant pain to her eye but she has had some itching.  The history is provided by the patient.   Past Medical History:  Diagnosis Date   Allergy    Arthritis    [employer] 06/30/2020   GERD (gastroesophageal reflux disease)    Glaucoma    Headache(784.0)    Hyperlipidemia    Hypertension    Osteoarthritis    Thyroid disease    hypo   Type II or unspecified type diabetes mellitus without mention of complication, uncontrolled 09/19/2010    Patient Active Problem List   Diagnosis Date Noted   Bacterial vaginitis 02/24/2021   Degenerative joint disease (DJD) of hip 10/14/2015   Fatty liver 09/19/2010   Hypothyroidism 06/18/2008   Hyperlipidemia with target LDL less than 100 06/04/2008   Essential hypertension 06/04/2008   GERD 06/04/2008   Generalized OA 06/04/2008    Past Surgical History:  Procedure Laterality Date   BREAST SURGERY     Lumpectomy- left breast, no lymph node involvement    OB History   No obstetric history on file.      Home Medications    Prior to Admission medications   Medication Sig Start Date End Date Taking? Authorizing Provider  trimethoprim-polymyxin b (POLYTRIM) ophthalmic solution Place 1 drop into the right eye every 4 (four) hours. 05/02/21  Yes Francene Finders, PA-C  acetaminophen (TYLENOL) 650 MG CR tablet Take 1,300 mg by mouth 2 (two) times daily.     [provider]  diazepam (VALIUM) 5 MG tablet 1 tablet by mouth prior to dental  appointment, if surgery not prior to cleanings 10/05/17   [provider]  gabapentin (NEURONTIN) 100 MG capsule TAKE 1 CAPSULE BY MOUTH THREE TIMES A DAY AS NEEDED 01/19/20   Lauree Chandler, NP  hydrochlorothiazide (HYDRODIURIL) 25 MG tablet Take 1 tablet (25 mg total) by mouth daily. 02/21/21 05/22/21  Camillia Herter, NP  levothyroxine (SYNTHROID) 125 MCG tablet Take 1 tablet (125 mcg total) by mouth daily. 02/28/21   Camillia Herter, NP  Multiple Vitamins-Minerals ([facility] SPECTRAVITE PO) Take 1 tablet by mouth daily.    [provider]  omeprazole (PRILOSEC) 40 MG capsule TAKE 1 CAPSULE BY MOUTH EVERY DAY (BE SURE TO TAKE 1/2 HOUR BEFORE MEAL) 02/21/21   Camillia Herter, NP  oxybutynin (DITROPAN) 5 MG tablet Take 5 mg by mouth 2 (two) times daily. 06/22/17   [provider]  Probiotic Product (PROBIOTIC DAILY PO) Take 1 capsule by mouth daily.    [provider]  rosuvastatin (CRESTOR) 40 MG tablet Take 1 tablet (40 mg total) by mouth daily. 09/11/20   Nicolette Bang, MD  valsartan (DIOVAN) 80 MG tablet Take 1 tablet (80 mg total) by mouth daily. 02/21/21   Camillia Herter, NP    Family History Family History  Problem Relation Age of Onset   Heart disease Mother  Diabetes Mother    Hypertension Mother    Alcohol abuse Other    Arthritis Other    Hyperlipidemia Other    Hypertension Other    Stroke Other        Female 1st degree relative < 60   Diabetes Maternal Grandmother    Colon cancer Neg Hx    Esophageal cancer Neg Hx    Stomach cancer Neg Hx    Rectal cancer Neg Hx     Social History Social History   Tobacco Use   Smoking status: Former    Packs/day: 1.00    Years: 25.00    Pack years: 25.00    Types: Cigarettes    Quit date: 05/25/2000    Years since quitting: 20.9   Smokeless tobacco: Never  Vaping Use   Vaping Use: Some days   Start date: 11/26/2018  Substance Use Topics   Alcohol use: No   Drug use: No      Allergies   Bee venom, Other, and Poison ivy extract   Review of Systems Review of Systems  Constitutional:  Negative for chills and fever.  HENT:  Positive for congestion and rhinorrhea.   Eyes:  Negative for discharge and redness.  Gastrointestinal:  Negative for abdominal pain, nausea and vomiting.  Genitourinary:  Positive for vaginal bleeding and vaginal discharge.    Physical Exam Triage Vital Signs ED Triage Vitals [05/02/21 1500]  Enc Vitals Group     BP (!) 137/94     Pulse Rate 75     Resp 18     Temp 98.5 F (36.9 C)     Temp Source Oral     SpO2 97 %     Weight      Height      Head Circumference      Peak Flow      Pain Score 0     Pain Loc      Pain Edu?      Excl. in [place]?    No data found.  Updated Vital Signs BP (!) 137/94 (BP Location: Left Arm)   Pulse 75   Temp 98.5 F (36.9 C) (Oral)   Resp 18   LMP 10/25/2014   SpO2 97%   Physical Exam Vitals and nursing note reviewed.  Constitutional:      General: She is not in acute distress.    Appearance: Normal appearance. She is not ill-appearing.  HENT:     Head: Normocephalic and atraumatic.  Eyes:     General: Lids are normal.        Right eye: Discharge present.        Left eye: No discharge.     Conjunctiva/sclera:     Right eye: Right conjunctiva is injected.     Left eye: Left conjunctiva is not injected.  Cardiovascular:     Rate and Rhythm: Normal rate.  Pulmonary:     Effort: Pulmonary effort is normal.  Neurological:     Mental Status: She is alert.  Psychiatric:        Mood and Affect: Mood normal.        Behavior: Behavior normal.        Thought Content: Thought content normal.     UC Treatments / Results  Labs (all labs ordered are listed, but only abnormal results are displayed) Labs Reviewed - No data to display  EKG   Radiology No results found.  Procedures Procedures (including critical Young time)  Medications Ordered in UC Medications - No data  to display  Initial Impression / Assessment and Plan / UC Course  I have reviewed the triage vital signs and the nursing notes.  Pertinent labs & imaging results that were available during my Young of the patient were reviewed by me and considered in my medical decision making (see chart for details).    Antibiotic drop prescribed to cover conjunctivitis.  Recommend follow-up if symptoms fail to improve or worsen.  Final Clinical Impressions(s) / UC Diagnoses   Final diagnoses:  Acute conjunctivitis of right eye, unspecified acute conjunctivitis type   Discharge Instructions   None    ED Prescriptions     Medication Sig Dispense Auth. Provider   trimethoprim-polymyxin b (POLYTRIM) ophthalmic solution Place 1 drop into the right eye every 4 (four) hours. 10 mL Francene Finders, PA-C      PDMP not reviewed this encounter.   Francene Finders, PA-C 05/02/21 1554

## 2021-05-05 ENCOUNTER — Ambulatory Visit
Admission: EM | Admit: 2021-05-05 | Discharge: 2021-05-05 | Disposition: A | Discharge status: Home / Self Care | Payer: No Typology Code available for payment source | Attending: Internal Medicine | Admitting: Internal Medicine

## 2021-05-05 ENCOUNTER — Other Ambulatory Visit: Payer: Self-pay

## 2021-05-05 ENCOUNTER — Encounter: Payer: Self-pay | Admitting: Emergency Medicine

## 2021-05-05 DIAGNOSIS — J069 Acute upper respiratory infection, unspecified: Secondary | ICD-10-CM | POA: Diagnosis not present

## 2021-05-05 MED ORDER — FLUTICASONE PROPIONATE 50 MCG/ACT NA SUSP: 1.0000 | Freq: Every day | NASAL | 0 refills | Status: DC

## 2021-05-05 MED ORDER — BENZONATATE 100 MG PO CAPS: 100.0000 mg | ORAL_CAPSULE | Freq: Three times a day (TID) | ORAL | 0 refills | Status: DC | PRN

## 2021-05-05 NOTE — ED Provider Notes (Signed)
[facility]    CSN: [id number] Arrival date & time: 05/05/21  0825      History   Chief Complaint Chief Complaint  Patient presents with   Cough    HPI Adriana Young is a 60 y.o. female.   Patient presents with cough, nasal congestion, sinus pressure that has been present for approximately 5 days.  Denies chest pain, shortness of breath, nausea, vomiting, diarrhea, abdominal pain.  Denies any known fevers or sick contacts.  Patient was seen on 05/02/2021 and diagnosed with conjunctivitis and was prescribed antibiotic eyedrops with improvement.  Patient has been taking over-the-counter cold and flu medications with minimal improvement in symptoms.   Cough  Past Medical History:  Diagnosis Date   Allergy    Arthritis    [employer] 06/30/2020   GERD (gastroesophageal reflux disease)    Glaucoma    Headache(784.0)    Hyperlipidemia    Hypertension    Osteoarthritis    Thyroid disease    hypo   Type II or unspecified type diabetes mellitus without mention of complication, uncontrolled 09/19/2010    Patient Active Problem List   Diagnosis Date Noted   Bacterial vaginitis 02/24/2021   Degenerative joint disease (DJD) of hip 10/14/2015   Fatty liver 09/19/2010   Hypothyroidism 06/18/2008   Hyperlipidemia with target LDL less than 100 06/04/2008   Essential hypertension 06/04/2008   GERD 06/04/2008   Generalized OA 06/04/2008    Past Surgical History:  Procedure Laterality Date   BREAST SURGERY     Lumpectomy- left breast, no lymph node involvement    OB History   No obstetric history on file.      Home Medications    Prior to Admission medications   Medication Sig Start Date End Date Taking? Authorizing Provider  acetaminophen (TYLENOL) 650 MG CR tablet Take 1,300 mg by mouth 2 (two) times daily.    Yes [provider]  benzonatate (TESSALON) 100 MG capsule Take 1 capsule (100 mg total) by mouth every 8 (eight) hours as needed for cough.  05/05/21  Yes Larue Lightner, Michele Rockers, FNP  diazepam (VALIUM) 5 MG tablet 1 tablet by mouth prior to dental appointment, if surgery not prior to cleanings 10/05/17  Yes [provider]  fluticasone (FLONASE) 50 MCG/ACT nasal spray Place 1 spray into both nostrils daily for 3 days. 05/05/21 05/08/21 Yes Adair Lemar, Hildred Alamin E, FNP  gabapentin (NEURONTIN) 100 MG capsule TAKE 1 CAPSULE BY MOUTH THREE TIMES A DAY AS NEEDED 01/19/20  Yes Lauree Chandler, NP  hydrochlorothiazide (HYDRODIURIL) 25 MG tablet Take 1 tablet (25 mg total) by mouth daily. 02/21/21 05/22/21 Yes Minette Brine, Amy J, NP  levothyroxine (SYNTHROID) 125 MCG tablet Take 1 tablet (125 mcg total) by mouth daily. 02/28/21  Yes Minette Brine, Amy J, NP  Multiple Vitamins-Minerals ([employer] SPECTRAVITE PO) Take 1 tablet by mouth daily.   Yes [provider]  omeprazole (PRILOSEC) 40 MG capsule TAKE 1 CAPSULE BY MOUTH EVERY DAY (BE SURE TO TAKE 1/2 HOUR BEFORE MEAL) 02/21/21  Yes Minette Brine, Amy J, NP  oxybutynin (DITROPAN) 5 MG tablet Take 5 mg by mouth 2 (two) times daily. 06/22/17  Yes [provider]  Probiotic Product (PROBIOTIC DAILY PO) Take 1 capsule by mouth daily.   Yes [provider]  rosuvastatin (CRESTOR) 40 MG tablet Take 1 tablet (40 mg total) by mouth daily. 09/11/20  Yes Nicolette Bang, MD  trimethoprim-polymyxin b Mayra Neer) ophthalmic solution Place 1 drop into the right eye every  4 (four) hours. 05/02/21  Yes Francene Finders, PA-C  valsartan (DIOVAN) 80 MG tablet Take 1 tablet (80 mg total) by mouth daily. 02/21/21  Yes Camillia Herter, NP    Family History Family History  Problem Relation Age of Onset   Heart disease Mother    Diabetes Mother    Hypertension Mother    Alcohol abuse Other    Arthritis Other    Hyperlipidemia Other    Hypertension Other    Stroke Other        Female 1st degree relative < 60   Diabetes Maternal Grandmother    Colon cancer Neg Hx    Esophageal cancer Neg Hx    Stomach  cancer Neg Hx    Rectal cancer Neg Hx     Social History Social History   Tobacco Use   Smoking status: Former    Packs/day: 1.00    Years: 25.00    Pack years: 25.00    Types: Cigarettes    Quit date: 05/25/2000    Years since quitting: 20.9   Smokeless tobacco: Never  Vaping Use   Vaping Use: Some days   Start date: 11/26/2018  Substance Use Topics   Alcohol use: No   Drug use: No     Allergies   Bee venom, Other, and Poison ivy extract   Review of Systems Review of Systems Per HPI  Physical Exam Triage Vital Signs ED Triage Vitals  Enc Vitals Group     BP 05/05/21 0924 (!) 155/102     Pulse Rate 05/05/21 0924 88     Resp 05/05/21 0924 20     Temp 05/05/21 0924 98.2 F (36.8 C)     Temp Source 05/05/21 0924 Oral     SpO2 05/05/21 0924 95 %     Weight 05/05/21 0925 214 lb 15.2 oz (97.5 kg)     Height 05/05/21 0925 5\' 9"  (1.753 m)     Head Circumference --      Peak Flow --      Pain Score 05/05/21 0925 8     Pain Loc --      Pain Edu? --      Excl. in [place]? --    No data found.  Updated Vital Signs BP (!) 155/102 (BP Location: Left Arm)   Pulse 88   Temp 98.2 F (36.8 C) (Oral)   Resp 20   Ht 5\' 9"  (1.753 m)   Wt 214 lb 15.2 oz (97.5 kg)   LMP 10/25/2014   SpO2 95%   BMI 31.74 kg/m   Visual Acuity Right Eye Distance:   Left Eye Distance:   Bilateral Distance:    Right Eye Near:   Left Eye Near:    Bilateral Near:     Physical Exam Constitutional:      General: She is not in acute distress.    Appearance: Normal appearance. She is not toxic-appearing or diaphoretic.  HENT:     Head: Normocephalic and atraumatic.     Right Ear: Tympanic membrane and ear canal normal.     Left Ear: Tympanic membrane and ear canal normal.     Nose: Congestion present.     Mouth/Throat:     Mouth: Mucous membranes are moist.     Pharynx: No posterior oropharyngeal erythema.  Eyes:     Extraocular Movements: Extraocular movements intact.      Conjunctiva/sclera: Conjunctivae normal.     Pupils: Pupils are equal, round, and  reactive to light.  Cardiovascular:     Rate and Rhythm: Normal rate and regular rhythm.     Pulses: Normal pulses.     Heart sounds: Normal heart sounds.  Pulmonary:     Effort: Pulmonary effort is normal. No respiratory distress.     Breath sounds: Normal breath sounds. No stridor. No wheezing, rhonchi or rales.  Abdominal:     General: Abdomen is flat. Bowel sounds are normal.     Palpations: Abdomen is soft.  Musculoskeletal:        General: Normal range of motion.     Cervical back: Normal range of motion.  Skin:    General: Skin is warm and dry.  Neurological:     General: No focal deficit present.     Mental Status: She is alert and oriented to person, place, and time. Mental status is at baseline.  Psychiatric:        Mood and Affect: Mood normal.        Behavior: Behavior normal.     UC Treatments / Results  Labs (all labs ordered are listed, but only abnormal results are displayed) Labs Reviewed  COVID-19, FLU A+B NAA    EKG   Radiology No results found.  Procedures Procedures (including critical care time)  Medications Ordered in UC Medications - No data to display  Initial Impression / Assessment and Plan / UC Course  I have reviewed the triage vital signs and the nursing notes.  Pertinent labs & imaging results that were available during my care of the patient were reviewed by me and considered in my medical decision making (see chart for details).     Patient presents with symptoms likely from a viral upper respiratory infection. Differential includes bacterial pneumonia, sinusitis, allergic rhinitis, Covid 19, flu. Do not suspect underlying cardiopulmonary process. Symptoms seem unlikely related to ACS, CHF or COPD exacerbations, pneumonia, pneumothorax. Patient is nontoxic appearing and not in need of emergent medical intervention.  COVID-19 and flu test pending.  Do  not think that chest imaging necessary given that there are no adventitious lung sounds or signs of respiratory compromise on exam.  Recommended symptom control with over the counter medications that are safe with high blood pressure.  Flonase and benzonatate prescribed.  Unable to prescribe prednisone as patient has documented history of glaucoma.  Return if symptoms fail to improve in 1-2 weeks or you develop shortness of breath, chest pain, severe headache. Patient states understanding and is agreeable.  Discharged with PCP followup.  Final Clinical Impressions(s) / UC Diagnoses   Final diagnoses:  Viral upper respiratory tract infection with cough     Discharge Instructions      It appears that you have a viral upper respiratory infection that should resolve in the next few days with symptomatic treatment.  You have been prescribed 2 medications help alleviate symptoms.  COVID-19 and flu test is pending.  We will call if it is positive.    ED Prescriptions     Medication Sig Dispense Auth. Provider   fluticasone (FLONASE) 50 MCG/ACT nasal spray Place 1 spray into both nostrils daily for 3 days. 16 g Jaceon Heiberger, Hildred Alamin E, [place]   benzonatate (TESSALON) 100 MG capsule Take 1 capsule (100 mg total) by mouth every 8 (eight) hours as needed for cough. 21 capsule [place], Michele Rockers, [place]      PDMP not reviewed this encounter.   Teodora Medici, [place] 05/05/21 1024

## 2021-05-05 NOTE — ED Triage Notes (Signed)
Patient c/o chest congestion, productive cough, head pressure x 3 days.  Patient has been taken OTC cold meds w/o relief.

## 2021-05-05 NOTE — Discharge Instructions (Addendum)
It appears that you have a viral upper respiratory infection that should resolve in the next few days with symptomatic treatment.  You have been prescribed 2 medications help alleviate symptoms.  COVID-19 and flu test is pending.  We will call if it is positive.

## 2021-05-06 LAB — COVID-19, FLU A+B NAA
Influenza A, NAA: NOT DETECTED
Influenza B, NAA: NOT DETECTED
SARS-CoV-2, NAA: NOT DETECTED

## 2021-06-13 NOTE — Progress Notes (Signed)
Patient ID: Adriana Young, female    DOB: [birth year]  MRN: [id number]  CC: Hypertension Follow-Up   Subjective: Adriana Young is a 61 y.o. female who presents for hypertension follow-up.   Her concerns today include:  HYPERTENSION FOLLOW-UP: 02/21/2021: - Continue Hydrochlorothiazide and Valsartan as prescribed.   06/17/2021: Doing well on current regimen. No side effects. No issues/concerns.   2. PREDIABETES FOLLOW-UP: Family history of diabetes. Repeat screening today.   3. CRAMPING LEGS: Reports back of legs cramping mostly at nighttime. Denies additional red flag symptoms.   4. COUGH: Reports dry cough which causes intermittent shortness of breath and mild chest pain. Denies additional red flag symptoms. Reports history of bronchitis 4 years ago and prescribed an inhaler from [place].  5. BUMP ON WRIST: Reports one day noticed a painless bump on palmar aspect of left wrist. Was told from an external provider this may be a calcium deposit. Has decreased in size since several weeks ago. Denies additional red flag symptoms.    Patient Active Problem List   Diagnosis Date Noted   Bacterial vaginitis 02/24/2021   Degenerative joint disease (DJD) of hip 10/14/2015   Fatty liver 09/19/2010   Hypothyroidism 06/18/2008   Hyperlipidemia with target LDL less than 100 06/04/2008   Essential hypertension 06/04/2008   GERD 06/04/2008   Generalized OA 06/04/2008     Current Outpatient Medications on File Prior to Visit  Medication Sig Dispense Refill   acetaminophen (TYLENOL) 650 MG CR tablet Take 1,300 mg by mouth 2 (two) times daily.      benzonatate (TESSALON) 100 MG capsule Take 1 capsule (100 mg total) by mouth every 8 (eight) hours as needed for cough. 21 capsule 0   diazepam (VALIUM) 5 MG tablet 1 tablet by mouth prior to dental appointment, if surgery not prior to cleanings  0   fluticasone (FLONASE) 50 MCG/ACT nasal spray Place 1 spray into both  nostrils daily for 3 days. 16 g 0   gabapentin (NEURONTIN) 100 MG capsule TAKE 1 CAPSULE BY MOUTH THREE TIMES A DAY AS NEEDED 90 capsule 3   levothyroxine (SYNTHROID) 125 MCG tablet Take 1 tablet (125 mcg total) by mouth daily. 90 tablet 1   Multiple Vitamins-Minerals ([facility] SPECTRAVITE PO) Take 1 tablet by mouth daily.     omeprazole (PRILOSEC) 40 MG capsule TAKE 1 CAPSULE BY MOUTH EVERY DAY (BE SURE TO TAKE 1/2 HOUR BEFORE MEAL) 90 capsule 0   oxybutynin (DITROPAN) 5 MG tablet Take 5 mg by mouth 2 (two) times daily.  12   Probiotic Product (PROBIOTIC DAILY PO) Take 1 capsule by mouth daily.     rosuvastatin (CRESTOR) 40 MG tablet Take 1 tablet (40 mg total) by mouth daily. 90 tablet 3   trimethoprim-polymyxin b (POLYTRIM) ophthalmic solution Place 1 drop into the right eye every 4 (four) hours. 10 mL 0   No current facility-administered medications on file prior to visit.    Allergies  Allergen Reactions   Bee Venom Anaphylaxis   Other Hives, Shortness Of Breath, Itching, Swelling and Rash    Bee stings    Poison Ivy Extract Rash    Social History   Socioeconomic History   Marital status: Married    Spouse name: Not on file   Number of children: Not on file   Years of education: Not on file   Highest education level: Not on file  Occupational History   Not on file  Tobacco Use  Smoking status: Former    Packs/day: 1.00    Years: 25.00    Pack years: 25.00    Types: Cigarettes    Quit date: 05/25/2000    Years since quitting: 21.0   Smokeless tobacco: Never  Vaping Use   Vaping Use: Some days   Start date: 11/26/2018  Substance and Sexual Activity   Alcohol use: No   Drug use: No   Sexual activity: Yes  Other Topics Concern   Not on file  Social History Narrative   Social History      Diet? Just watching- cutting out bread, drinking water      Do you drink/eat things with caffeine? yes      Marital status?        married                            What year were  you married? 1980      Do you live in a house, apartment, assisted living, condo, trailer, etc.? house      Is it one or more stories? one      How many persons live in your home? 3      Do you have any pets in your home? (please list) yes 2 dogs, 3 cats, 3 fish      Highest level of education completed? 2 year college      Current or past profession:      Do you exercise?         yes                             Type & how often? gym      Advanced Directives      Do you have a living will? yes      Do you have a DNR form?                                  If not, do you want to discuss one? no      Do you have signed POA/HPOA for forms? yes      Functional Status      Do you have difficulty bathing or dressing yourself? no      Do you have difficulty preparing food or eating? no      Do you have difficulty managing your medications? no      Do you have difficulty managing your finances? no      Do you have difficulty affording your medications? no   Social Determinants of Health   Financial Resource Strain: Not on file  Food Insecurity: Not on file  Transportation Needs: Not on file  Physical Activity: Not on file  Stress: Not on file  Social Connections: Not on file  Intimate Partner Violence: Not on file    Family History  Problem Relation Age of Onset   Heart disease Mother    Diabetes Mother    Hypertension Mother    Alcohol abuse Other    Arthritis Other    Hyperlipidemia Other    Hypertension Other    Stroke Other        Female 1st degree relative < 60   Diabetes Maternal Grandmother    Colon cancer Neg Hx    Esophageal cancer Neg Hx  Stomach cancer Neg Hx    Rectal cancer Neg Hx     Past Surgical History:  Procedure Laterality Date   BREAST SURGERY     Lumpectomy- left breast, no lymph node involvement    ROS: Review of Systems Negative except as stated above  PHYSICAL EXAM: BP 121/78 (BP Location: Left Arm, Patient Position:  Sitting, Cuff Size: Large)    Pulse 72    Temp 98.3 F (36.8 C)    Resp 18    Wt 211 lb (95.7 kg)    LMP 10/25/2014    SpO2 97%    BMI 31.16 kg/m   Physical Exam HENT:     Head: Normocephalic and atraumatic.  Eyes:     Extraocular Movements: Extraocular movements intact.     Conjunctiva/sclera: Conjunctivae normal.     Pupils: Pupils are equal, round, and reactive to light.  Cardiovascular:     Rate and Rhythm: Normal rate and regular rhythm.     Pulses: Normal pulses.     Heart sounds: Normal heart sounds.  Pulmonary:     Effort: Pulmonary effort is normal.     Breath sounds: Normal breath sounds.  Musculoskeletal:     Cervical back: Normal range of motion and neck supple.     Comments: Left palmar wrist with ganglion cyst.   Neurological:     General: No focal deficit present.     Mental Status: She is alert and oriented to person, place, and time.  Psychiatric:        Mood and Affect: Mood normal.        Behavior: Behavior normal.   Results for orders placed or performed in visit on 06/17/21  POCT glycosylated hemoglobin (Hb A1C)  Result Value Ref Range   Hemoglobin A1C 6.2 (A) 4.0 - 5.6 %   HbA1c POC (<> result, manual entry)     HbA1c, POC (prediabetic range)     HbA1c, POC (controlled diabetic range)      ASSESSMENT AND PLAN: 1. Essential hypertension: - Continue Hydrochlorothiazide and Valsartan as prescribed.  - Counseled on blood pressure goal of less than 130/80, low-sodium, DASH diet, medication compliance, 150 minutes of moderate intensity exercise per week as tolerated. Discussed medication compliance, adverse effects. - Follow-up with primary provider in 3 months or sooner if needed. - hydrochlorothiazide (HYDRODIURIL) 25 MG tablet; Take 1 tablet (25 mg total) by mouth daily.  Dispense: 90 tablet; Refill: 0 - valsartan (DIOVAN) 80 MG tablet; Take 1 tablet (80 mg total) by mouth daily.  Dispense: 90 tablet; Refill: 0  2. Prediabetes: - Hemoglobin A1c today  6.2% and remaining consistent with prediabetes. - Discussed the importance of healthy eating habits, low-carbohydrate diet, low-sugar diet, and regular aerobic exercise (at least 150 minutes a week as tolerated) to achieve or maintain control of prediabetes. - Follow-up with primary provider in 6 months or sooner if needed.  - POCT glycosylated hemoglobin (Hb A1C)  3. Leg cramping: 4. Hyperlipidemia with target LDL less than 100: - Counseled nocturnal leg cramping may be secondary to Rosuvastatin. Will trial discontinuation of the same for 7 to 14 days. Counseled to follow-up with primary care at that time. Patient agreeable.  The 10-year ASCVD risk score (Arnett DK, et al., 2019) is: 4.3%   Values used to calculate the score:     Age: 64 years     Sex: Female     Is Non-Hispanic African American: No     Diabetic: No  Tobacco smoker: No     Systolic Blood Pressure: 748 mmHg     Is BP treated: Yes     HDL Cholesterol: 52 mg/dL     Total Cholesterol: 182 mg/dL  5. History of bronchitis: 6. Dyspnea, unspecified type: 7. Chest pain, unspecified type: - Patient today in office without cardiopulmonary distress.  - Diagnostic chest x-ray for further evaluation. Patient plans to return tomorrow for completion.  - Follow-up with primary provider as scheduled.  - DG Chest 2 View; Future  8. Ganglion of left wrist: - Asymptomatic in presentation.  - Counseled patient to monitor and notify primary provider of any changes. Patient agreeable.     Patient was given the opportunity to ask questions.  Patient verbalized understanding of the plan and was able to repeat key elements of the plan. Patient was given clear instructions to go to Emergency Department or return to medical center if symptoms don't improve, worsen, or new problems develop.The patient verbalized understanding.   Orders Placed This Encounter  Procedures   DG Chest 2 View   POCT glycosylated hemoglobin (Hb A1C)      Requested Prescriptions   Signed Prescriptions Disp Refills   hydrochlorothiazide (HYDRODIURIL) 25 MG tablet 90 tablet 0    Sig: Take 1 tablet (25 mg total) by mouth daily.   valsartan (DIOVAN) 80 MG tablet 90 tablet 0    Sig: Take 1 tablet (80 mg total) by mouth daily.    Return in about 3 months (around 09/15/2021) for Follow-Up or next available hypertension .  Camillia Herter, NP

## 2021-06-17 ENCOUNTER — Ambulatory Visit (INDEPENDENT_AMBULATORY_CARE_PROVIDER_SITE_OTHER): Payer: No Typology Code available for payment source | Admitting: Family

## 2021-06-17 ENCOUNTER — Other Ambulatory Visit: Payer: Self-pay

## 2021-06-17 VITALS — BP 121/78 | HR 72 | Temp 98.3°F | Resp 18 | Wt 211.0 lb

## 2021-06-17 DIAGNOSIS — Z8709 Personal history of other diseases of the respiratory system: Secondary | ICD-10-CM

## 2021-06-17 DIAGNOSIS — M67432 Ganglion, left wrist: Secondary | ICD-10-CM

## 2021-06-17 DIAGNOSIS — E785 Hyperlipidemia, unspecified: Secondary | ICD-10-CM | POA: Diagnosis not present

## 2021-06-17 DIAGNOSIS — R0600 Dyspnea, unspecified: Secondary | ICD-10-CM

## 2021-06-17 DIAGNOSIS — R7303 Prediabetes: Secondary | ICD-10-CM | POA: Diagnosis not present

## 2021-06-17 DIAGNOSIS — R252 Cramp and spasm: Secondary | ICD-10-CM

## 2021-06-17 DIAGNOSIS — I10 Essential (primary) hypertension: Secondary | ICD-10-CM | POA: Diagnosis not present

## 2021-06-17 DIAGNOSIS — R079 Chest pain, unspecified: Secondary | ICD-10-CM

## 2021-06-17 LAB — POCT GLYCOSYLATED HEMOGLOBIN (HGB A1C): Hemoglobin A1C: 6.2 % — AB (ref 4.0–5.6)

## 2021-06-17 MED ORDER — HYDROCHLOROTHIAZIDE 25 MG PO TABS: 25.0000 mg | ORAL_TABLET | Freq: Every day | ORAL | 0 refills | Status: DC

## 2021-06-17 MED ORDER — VALSARTAN 80 MG PO TABS: 80.0000 mg | ORAL_TABLET | Freq: Every day | ORAL | 0 refills | Status: DC

## 2021-06-17 NOTE — Progress Notes (Signed)
Pt presents for hypertension follow-up, pt complains of dry cough which causes SOB and mild chest pain, experiencing fatigue Bilateral leg cramping, also concerned about bump left hand denies pain ,   Pt has family hx of diabetes A1c 5.9% on 08/19/20 up to 6.2%

## 2021-06-17 NOTE — Progress Notes (Signed)
Prediabetes discussed in office.

## 2021-06-17 NOTE — Patient Instructions (Signed)
Prediabetes Eating Plan °Prediabetes is a condition that causes blood sugar (glucose) levels to be higher than normal. This increases the risk for developing type 2 diabetes (type 2 diabetes mellitus). Working with a health care provider or nutrition specialist (dietitian) to make diet and lifestyle changes can help prevent the onset of diabetes. These changes may help you: °Control your blood glucose levels. °Improve your cholesterol levels. °Manage your blood pressure. °What are tips for following this plan? °Reading food labels °Read food labels to check the amount of fat, salt (sodium), and sugar in prepackaged foods. Avoid foods that have: °Saturated fats. °Trans fats. °Added sugars. °Avoid foods that have more than 300 milligrams (mg) of sodium per serving. Limit your sodium intake to less than 2,300 mg each day. °Shopping °Avoid buying pre-made and processed foods. °Avoid buying drinks with added sugar. °Cooking °Cook with olive oil. Do not use butter, lard, or ghee. °Bake, broil, grill, steam, or boil foods. Avoid frying. °Meal planning ° °Work with your dietitian to create an eating plan that is right for you. This may include tracking how many calories you take in each day. Use a food diary, notebook, or mobile application to track what you eat at each meal. °Consider following a Mediterranean diet. This includes: °Eating several servings of fresh fruits and vegetables each day. °Eating fish at least twice a week. °Eating one serving each day of whole grains, beans, nuts, and seeds. °Using olive oil instead of other fats. °Limiting alcohol. °Limiting red meat. °Using nonfat or low-fat dairy products. °Consider following a plant-based diet. This includes dietary choices that focus on eating mostly vegetables and fruit, grains, beans, nuts, and seeds. °If you have high blood pressure, you may need to limit your sodium intake or follow a diet such as the DASH (Dietary Approaches to Stop Hypertension) eating  plan. The DASH diet aims to lower high blood pressure. °Lifestyle °Set weight loss goals with help from your health care team. It is recommended that most people with prediabetes lose 7% of their body weight. °Exercise for at least 30 minutes 5 or more days a week. °Attend a support group or seek support from a mental health counselor. °Take over-the-counter and prescription medicines only as told by your health care provider. °What foods are recommended? °Fruits °Berries. Bananas. Apples. Oranges. Grapes. Papaya. Mango. Pomegranate. Kiwi. Grapefruit. Cherries. °Vegetables °Lettuce. Spinach. Peas. Beets. Cauliflower. Cabbage. Broccoli. Carrots. Tomatoes. Squash. Eggplant. Herbs. Peppers. Onions. Cucumbers. Brussels sprouts. °Grains °Whole grains, such as whole-wheat or whole-grain breads, crackers, cereals, and pasta. Unsweetened oatmeal. Bulgur. Barley. Quinoa. Brown rice. Corn or whole-wheat flour tortillas or taco shells. °Meats and other proteins °Seafood. Poultry without skin. Lean cuts of pork and beef. Tofu. Eggs. Nuts. Beans. °Dairy °Low-fat or fat-free dairy products, such as yogurt, cottage cheese, and cheese. °Beverages °Water. Tea. Coffee. Sugar-free or diet soda. Seltzer water. Low-fat or nonfat milk. Milk alternatives, such as soy or almond milk. °Fats and oils °Olive oil. Canola oil. Sunflower oil. Grapeseed oil. Avocado. Walnuts. °Sweets and desserts °Sugar-free or low-fat pudding. Sugar-free or low-fat ice cream and other frozen treats. °Seasonings and condiments °Herbs. Sodium-free spices. Mustard. Relish. Low-salt, low-sugar ketchup. Low-salt, low-sugar barbecue sauce. Low-fat or fat-free mayonnaise. °The items listed above may not be a complete list of recommended foods and beverages. Contact a dietitian for more information. °What foods are not recommended? °Fruits °Fruits canned with syrup. °Vegetables °Canned vegetables. Frozen vegetables with butter or cream sauce. °Grains °Refined white  flour and flour   products, such as bread, pasta, snack foods, and cereals. °Meats and other proteins °Fatty cuts of meat. Poultry with skin. Breaded or fried meat. Processed meats. °Dairy °Full-fat yogurt, cheese, or milk. °Beverages °Sweetened drinks, such as iced tea and soda. °Fats and oils °Butter. Lard. Ghee. °Sweets and desserts °Baked goods, such as cake, cupcakes, pastries, cookies, and cheesecake. °Seasonings and condiments °Spice mixes with added salt. Ketchup. Barbecue sauce. Mayonnaise. °The items listed above may not be a complete list of foods and beverages that are not recommended. Contact a dietitian for more information. °Where to find more information °American Diabetes Association: www.diabetes.org °Summary °You may need to make diet and lifestyle changes to help prevent the onset of diabetes. These changes can help you control blood sugar, improve cholesterol levels, and manage blood pressure. °Set weight loss goals with help from your health care team. It is recommended that most people with prediabetes lose 7% of their body weight. °Consider following a Mediterranean diet. This includes eating plenty of fresh fruits and vegetables, whole grains, beans, nuts, seeds, fish, and low-fat dairy, and using olive oil instead of other fats. °This information is not intended to replace advice given to you by your health care provider. Make sure you discuss any questions you have with your health care provider. °Document Revised: 08/10/2019 Document Reviewed: 08/10/2019 °Elsevier Patient Education © 2022 [employer] Inc. ° °

## 2021-06-18 ENCOUNTER — Ambulatory Visit (INDEPENDENT_AMBULATORY_CARE_PROVIDER_SITE_OTHER): Payer: No Typology Code available for payment source

## 2021-06-18 DIAGNOSIS — R0600 Dyspnea, unspecified: Secondary | ICD-10-CM

## 2021-06-18 DIAGNOSIS — R079 Chest pain, unspecified: Secondary | ICD-10-CM | POA: Diagnosis not present

## 2021-06-18 DIAGNOSIS — Z8709 Personal history of other diseases of the respiratory system: Secondary | ICD-10-CM

## 2021-06-18 NOTE — Addendum Note (Signed)
Addended by: Elmon Else on: 06/18/2021 01:57 PM   Modules accepted: Orders

## 2021-06-20 NOTE — Progress Notes (Signed)
No active heart or lung disease.

## 2021-07-03 ENCOUNTER — Other Ambulatory Visit: Payer: Self-pay | Admitting: Nurse Practitioner

## 2021-07-03 DIAGNOSIS — M5442 Lumbago with sciatica, left side: Secondary | ICD-10-CM

## 2021-07-03 DIAGNOSIS — G8929 Other chronic pain: Secondary | ICD-10-CM

## 2021-07-23 ENCOUNTER — Encounter: Payer: Self-pay | Admitting: Gastroenterology

## 2021-08-06 ENCOUNTER — Other Ambulatory Visit: Payer: Self-pay

## 2021-08-06 ENCOUNTER — Encounter: Payer: Self-pay | Admitting: Emergency Medicine

## 2021-08-06 ENCOUNTER — Ambulatory Visit
Admission: EM | Admit: 2021-08-06 | Discharge: 2021-08-06 | Disposition: A | Discharge status: Home / Self Care | Payer: No Typology Code available for payment source | Attending: Internal Medicine | Admitting: Internal Medicine

## 2021-08-06 DIAGNOSIS — J069 Acute upper respiratory infection, unspecified: Secondary | ICD-10-CM

## 2021-08-06 DIAGNOSIS — M546 Pain in thoracic spine: Secondary | ICD-10-CM | POA: Diagnosis not present

## 2021-08-06 LAB — POCT INFLUENZA A/B
Influenza A, POC: NEGATIVE
Influenza B, POC: NEGATIVE

## 2021-08-06 MED ORDER — BENZONATATE 100 MG PO CAPS: 100.0000 mg | ORAL_CAPSULE | Freq: Three times a day (TID) | ORAL | 0 refills | Status: DC | PRN

## 2021-08-06 MED ORDER — FLUTICASONE PROPIONATE 50 MCG/ACT NA SUSP: 1.0000 | Freq: Every day | NASAL | 0 refills | Status: DC

## 2021-08-06 NOTE — ED Provider Notes (Addendum)
[facility]    CSN: [id number] Arrival date & time: 08/06/21  4098      History   Chief Complaint Chief Complaint  Patient presents with   Fever    HPI Adriana Young is a 61 y.o. female.   Patient presents with fever, cough, nasal congestion that has been present since yesterday.  Tmax at home was 101.  Patient denies any known sick contacts.  Denies chest pain, shortness of breath, ear pain, sore throat, nausea, vomiting, diarrhea, abdominal pain.  Patient also reports that she has left upper back/shoulder pain that started this morning upon awakening.  Patient has taken Tylenol with minimal improvement.  Denies any numbness or tingling.  Has full range of motion of left upper extremity.  Denies any apparent injury.   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      Home Medications    Prior to Admission medications   Medication Sig Start Date End Date Taking? Authorizing Provider  acetaminophen (TYLENOL) 650 MG CR tablet Take 1,300 mg by mouth 2 (two) times daily.    Yes [provider]  benzonatate (TESSALON) 100 MG capsule Take 1  capsule (100 mg total) by mouth every 8 (eight) hours as needed for cough. 08/06/21  Yes Lenard Kampf, Acie Fredrickson, FNP  diazepam (VALIUM) 5 MG tablet 1 tablet by mouth prior to dental appointment, if surgery not prior to cleanings 10/05/17  Yes [provider]  fluticasone (FLONASE) 50 MCG/ACT nasal spray Place 1 spray into both nostrils daily for 3 days. 08/06/21 08/09/21 Yes Micaila Ziemba, Rolly Salter E, FNP  gabapentin (NEURONTIN) 100 MG capsule TAKE 1 CAPSULE BY MOUTH THREE TIMES A DAY AS NEEDED 01/19/20  Yes Sharon Seller, NP  hydrochlorothiazide (HYDRODIURIL) 25 MG tablet Take 1 tablet (25 mg total) by mouth daily. 06/17/21 09/15/21 Yes Zonia Kief, Amy J, NP  levothyroxine (SYNTHROID) 125 MCG tablet Take 1 tablet (125 mcg total) by mouth daily. 02/28/21  Yes Zonia Kief, Amy J, NP  Multiple Vitamins-Minerals ([facility] SPECTRAVITE PO) Take 1 tablet by mouth daily.   Yes [provider]  omeprazole (PRILOSEC) 40 MG capsule TAKE 1 CAPSULE BY MOUTH EVERY DAY (BE SURE TO TAKE 1/2 HOUR BEFORE MEAL) 02/21/21  Yes Zonia Kief, Amy J, NP  oxybutynin (DITROPAN) 5 MG tablet Take 5 mg by mouth 2 (two) times daily. 06/22/17  Yes [provider]  Probiotic Product (PROBIOTIC DAILY PO) Take 1 capsule by mouth daily.   Yes [provider]  rosuvastatin (CRESTOR) 40 MG tablet Take 1 tablet (40 mg total) by mouth daily. 09/11/20  Yes Arvilla Market, MD  trimethoprim-polymyxin b Joaquim Lai) ophthalmic solution Place 1 drop into the right eye every 4 (four) hours. 05/02/21  Yes Tomi Bamberger, PA-C  valsartan (DIOVAN) 80 MG tablet Take 1 tablet (80 mg total) by mouth daily. 06/17/21  Yes Rema Fendt, NP    Family History Family History  Problem Relation Age of Onset   Heart disease Mother    Diabetes Mother    Hypertension Mother    Alcohol abuse Other    Arthritis Other    Hyperlipidemia Other    Hypertension Other    Stroke Other        Female 1st degree relative < 60   Diabetes Maternal  Grandmother    Colon cancer Neg Hx    Esophageal cancer Neg Hx    Stomach cancer Neg Hx    Rectal cancer Neg Hx     Social History Social History   Tobacco Use   Smoking status: Former    Packs/day: 1.00    Years: 25.00    Pack years: 25.00    Types: Cigarettes    Quit date: 05/25/2000    Years since quitting: 21.2   Smokeless tobacco: Never  Vaping Use   Vaping Use: Some days   Start date: 11/26/2018  Substance Use Topics   Alcohol use: No   Drug use: No     Allergies   Bee venom, Other, and Poison ivy extract   Review of Systems Review of Systems Per HPI  Physical Exam Triage Vital Signs ED Triage Vitals  Enc Vitals Group     BP 08/06/21 0914 117/84     Pulse Rate 08/06/21 0914 98     Resp 08/06/21 0914 18     Temp 08/06/21 0914 99.3 F (37.4 C)     Temp Source 08/06/21 0914 Oral     SpO2 08/06/21 0914 95 %     Weight 08/06/21 0915 210 lb 15.7 oz (95.7 kg)     Height 08/06/21 0915 5\' 9"  (1.753 m)     Head Circumference --      Peak Flow --      Pain Score 08/06/21 0915 7     Pain Loc --      Pain Edu? --      Excl. in GC? --    No data found.  Updated Vital Signs BP 117/84 (BP Location: Left Arm)   Pulse 98   Temp 99.3 F (37.4 C) (Oral)   Resp 18   Ht 5\' 9"  (1.753 m)   Wt 210 lb 15.7 oz (95.7 kg)   LMP 10/25/2014   SpO2 95%   BMI 31.16 kg/m   Visual Acuity Right Eye Distance:   Left Eye Distance:   Bilateral Distance:    Right Eye Near:   Left Eye Near:    Bilateral Near:     Physical Exam Constitutional:      General: She is not in acute distress.    Appearance: Normal appearance. She is not toxic-appearing or diaphoretic.  HENT:     Head: Normocephalic and atraumatic.     Right Ear: Tympanic membrane and ear canal normal.     Left Ear: Tympanic membrane and ear canal normal.     Nose: Congestion present.     Mouth/Throat:     Mouth: Mucous membranes are moist.     Pharynx: No posterior oropharyngeal erythema.  Eyes:      Extraocular Movements: Extraocular movements intact.  Conjunctiva/sclera: Conjunctivae normal.     Pupils: Pupils are equal, round, and reactive to light.  Cardiovascular:     Rate and Rhythm: Normal rate and regular rhythm.     Pulses: Normal pulses.     Heart sounds: Normal heart sounds.  Pulmonary:     Effort: Pulmonary effort is normal. No respiratory distress.     Breath sounds: Normal breath sounds. No stridor. No wheezing, rhonchi or rales.  Abdominal:     General: Abdomen is flat. Bowel sounds are normal.     Palpations: Abdomen is soft.  Musculoskeletal:        General: Normal range of motion.     Cervical back: Normal range of motion.       Back:     Comments: Tenderness to palpation to left upper back.  No direct spinal tenderness, crepitus, step-off.  No erythema or bruising noted.  Patient has full range of motion of left upper extremity.  Grip strength 5/5.  Skin:    General: Skin is warm and dry.  Neurological:     General: No focal deficit present.     Mental Status: She is alert and oriented to person, place, and time. Mental status is at baseline.  Psychiatric:        Mood and Affect: Mood normal.        Behavior: Behavior normal.     UC Treatments / Results  Labs (all labs ordered are listed, but only abnormal results are displayed) Labs Reviewed  NOVEL CORONAVIRUS, NAA  POCT INFLUENZA A/B    EKG   Radiology No results found.  Procedures Procedures (including critical care time)  Medications Ordered in UC Medications - No data to display  Initial Impression / Assessment and Plan / UC Course  I have reviewed the triage vital signs and the nursing notes.  Pertinent labs & imaging results that were available during my care of the patient were reviewed by me and considered in my medical decision making (see chart for details).     Patient presents with symptoms likely from a viral upper respiratory infection. Differential includes bacterial  pneumonia, sinusitis, allergic rhinitis, COVID-19, flu. Do not suspect underlying cardiopulmonary process. Symptoms seem unlikely related to ACS, CHF or COPD exacerbations, pneumonia, pneumothorax. Patient is nontoxic appearing and not in need of emergent medical intervention.  Flu was negative.  COVID test pending. Recommended symptom control with over the counter medications.  Patient sent prescriptions.  Suspect muscular pain to left upper back.  No concern for other worrisome etiologies.  Patient to alternate ice and heat application.  Patient to continue Tylenol.  After further review of the chart, patient had elevated creatinine at last blood draw.  Therefore, patient may need to avoid NSAIDs taken in large quantities.  She may take Tylenol.  Patient advised of this and voiced understanding. Discussed strict return precautions.  Return if symptoms fail to improve in 1-2 weeks or you develop shortness of breath, chest pain, severe headache. Patient states understanding and is agreeable.  Discharged with PCP followup.  Final Clinical Impressions(s) / UC Diagnoses   Final diagnoses:  Viral upper respiratory tract infection with cough  Acute left-sided thoracic back pain     Discharge Instructions      Rapid flu was negative.  COVID test is pending.  We will call if it is positive.  It appears that you have a viral upper respiratory infection that should self resolve in the next few days with symptomatic  treatment.  You have been prescribed 2 medications to alleviate symptoms.     ED Prescriptions     Medication Sig Dispense Auth. Provider   fluticasone (FLONASE) 50 MCG/ACT nasal spray Place 1 spray into both nostrils daily for 3 days. 16 g Gale Klar, Rolly Salter E, [place]   benzonatate (TESSALON) 100 MG capsule Take 1 capsule (100 mg total) by mouth every 8 (eight) hours as needed for cough. 21 capsule Crook, Acie Fredrickson, [place]      PDMP not reviewed this encounter.   Gustavus Bryant, [place] 08/06/21  1002    [address], [place] 08/06/21 1004

## 2021-08-06 NOTE — Discharge Instructions (Signed)
Rapid flu was negative.  COVID test is pending.  We will call if it is positive.  It appears that you have a viral upper respiratory infection that should self resolve in the next few days with symptomatic treatment.  You have been prescribed 2 medications to alleviate symptoms. ?

## 2021-08-06 NOTE — ED Triage Notes (Signed)
Patient states that she woke up yesterday with a 101 fever, cough and congestion.  Left shoulder soreness that radiates down her back.  Patient has taken Tylenol. ?

## 2021-08-07 LAB — NOVEL CORONAVIRUS, NAA: SARS-CoV-2, NAA: NOT DETECTED

## 2021-08-22 ENCOUNTER — Other Ambulatory Visit: Payer: Self-pay | Admitting: Family

## 2021-09-10 NOTE — Progress Notes (Signed)
? ? ?Patient ID: Adriana Young, female    DOB: 1960-09-07  MRN: [id number] ? ?CC: Hypertension Follow-Up ? ?Subjective: ?Adriana Young is a 61 y.o. female who presents for hypertension follow-up.  ? ?Her concerns today include:  ?HYPERTENSION FOLLOW-UP: ?06/17/2021:  ?- Continue Hydrochlorothiazide and Valsartan as prescribed.  ? ?09/15/2021: ?Doing well on current regimen. No side effects. No issues/concerns. Denies chest pain and shortness of breath.  ? ?2. HYPERLIPIDEMIA FOLLOW-UP: ?06/17/2021: ?- Counseled nocturnal leg cramping may be secondary to Rosuvastatin. Will trial discontinuation of the same for 7 to 14 days. Counseled to follow-up with primary care at that time. Patient agreeable.  ? ?09/15/2021: ?Reports cramps have resolved since discontinuing cholesterol medication. Does have occasional cramp at bottom of feet. Checking feet routinely. Not fasting today and plans to return for cholesterol lab at later date.  ? ?Patient Active Problem List  ? Diagnosis Date Noted  ? Bacterial vaginitis 02/24/2021  ? 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 Fatty liver 09/19/2010  ? Hypothyroidism 06/18/2008  ? Hyperlipidemia with target LDL less than 100 06/04/2008  ? Essential hypertension 06/04/2008  ? GERD 06/04/2008  ? Generalized OA 06/04/2008  ?  ? ?Current Outpatient Medications on File Prior to Visit  ?Medication Sig Dispense Refill  ? acetaminophen (TYLENOL) 650 MG CR tablet Take 1,300 mg by mouth 2 (two) times daily.     ? diazepam (VALIUM) 5 MG tablet 1 tablet by mouth prior to dental appointment, if surgery not prior to cleanings  0  ? fluticasone (FLONASE) 50 MCG/ACT nasal spray Place 1 spray into both nostrils daily for 3 days. 16 g 0  ? levothyroxine (SYNTHROID) 125 MCG tablet TAKE 1 TABLET BY MOUTH EVERY DAY 90 tablet 0  ? Multiple Vitamins-Minerals ([facility] SPECTRAVITE PO) Take 1 tablet by mouth daily.    ? omeprazole (PRILOSEC) 40 MG capsule TAKE 1 CAPSULE BY MOUTH EVERY DAY (BE SURE TO TAKE 1/2 HOUR  BEFORE MEAL) 90 capsule 0  ? Probiotic Product (PROBIOTIC DAILY PO) Take 1 capsule by mouth daily.    ? ?No current facility-administered medications on file prior to visit.  ? ? ?Allergies  ?Allergen Reactions  ? Bee Venom Anaphylaxis  ? Other Hives, Shortness Of Breath, Itching, Swelling and Rash  ?  Bee stings   ? Poison Ivy Extract Rash  ? ? ?Social History  ? ?Socioeconomic History  ? Marital status: Married  ?  Spouse name: Not on file  ? Number of children: Not on file  ? Years of education: Not on file  ? Highest education level: Not on file  ?Occupational History  ? Not on file  ?Tobacco Use  ? Smoking status: Former  ?  Packs/day: 1.00  ?  Years: 25.00  ?  Pack years: 25.00  ?  Types: Cigarettes  ?  Quit date: 05/25/2000  ?  Years since quitting: 21.3  ? Smokeless tobacco: Never  ?Vaping Use  ? Vaping Use: Some days  ? Start date: 11/26/2018  ?Substance and Sexual Activity  ? Alcohol use: No  ? Drug use: No  ? Sexual activity: Yes  ?Other Topics Concern  ? Not on file  ?Social History Narrative  ? Social History  ?   ? Diet? Just watching- cutting out bread, drinking water  ?   ? Do you drink/eat things with caffeine? yes  ?   ? Marital status?        married  What year were you married? 1980  ?   ? Do you live in a house, apartment, assisted living, condo, trailer, etc.? house  ?   ? Is it one or more stories? one  ?   ? How many persons live in your home? 3  ?   ? Do you have any pets in your home? (please list) yes 2 dogs, 3 cats, 3 fish  ?   ? Highest level of education completed? 2 year college  ?   ? Current or past profession:  ?   ? Do you exercise?         yes                             Type & how often? gym  ?   ? Advanced Directives  ?   ? Do you have a living will? yes  ?   ? Do you have a DNR form?                                  If not, do you want to discuss one? no  ?   ? Do you have signed POA/HPOA for forms? yes  ?   ? Functional Status  ?   ? Do you have  difficulty bathing or dressing yourself? no  ?   ? Do you have difficulty preparing food or eating? no  ?   ? Do you have difficulty managing your medications? no  ?   ? Do you have difficulty managing your finances? no  ?   ? Do you have difficulty affording your medications? no  ? ?Social Determinants of Health  ? ?Financial Resource Strain: Not on file  ?Food Insecurity: Not on file  ?Transportation Needs: Not on file  ?Physical Activity: Not on file  ?Stress: Not on file  ?Social Connections: Not on file  ?Intimate Partner Violence: Not on file  ? ? ?Family History  ?Problem Relation Age of Onset  ? Heart disease Mother   ? Diabetes Mother   ? Hypertension Mother   ? Alcohol abuse Other   ? Arthritis Other   ? Hyperlipidemia Other   ? Hypertension Other   ? Stroke Other   ?     Female 1st degree relative < 60  ? Diabetes Maternal Grandmother   ? Colon cancer Neg Hx   ? Esophageal cancer Neg Hx   ? Stomach cancer Neg Hx   ? Rectal cancer Neg Hx   ? ? ?Past Surgical History:  ?Procedure Laterality Date  ? BREAST SURGERY    ? Lumpectomy- left breast, no lymph node involvement  ? ? ?ROS: ?Review of Systems ?Negative except as stated above ? ?PHYSICAL EXAM: ?BP 127/81 (BP Location: Left Arm, Patient Position: Sitting, Cuff Size: Large)   Pulse 75   Temp 98.3 ?F (36.8 ?C)   Resp 18   Ht 5' 9.02" (1.753 m)   Wt 217 lb (98.4 kg)   LMP 10/25/2014   SpO2 98%   BMI 32.03 kg/m?  ? ?Physical Exam ?HENT:  ?   Head: Normocephalic and atraumatic.  ?Eyes:  ?   Extraocular Movements: Extraocular movements intact.  ?   Conjunctiva/sclera: Conjunctivae normal.  ?   Pupils: Pupils are equal, round, and reactive to light.  ?Cardiovascular:  ?   Rate and Rhythm: Normal  rate and regular rhythm.  ?   Pulses: Normal pulses.  ?   Heart sounds: Normal heart sounds.  ?Pulmonary:  ?   Effort: Pulmonary effort is normal.  ?   Breath sounds: Normal breath sounds.  ?Musculoskeletal:  ?   Cervical back: Normal range of motion and neck  supple.  ?Neurological:  ?   General: No focal deficit present.  ?   Mental Status: She is alert and oriented to person, place, and time.  ?Psychiatric:     ?   Mood and Affect: Mood normal.     ?   Behavior: Behavior normal.  ? ?ASSESSMENT AND PLAN: ?1. Essential (primary) hypertension: ?- Continue Hydrochlorothiazide and Valsartan as prescribed.  ?- Counseled on blood pressure goal of less than 130/80, low-sodium, DASH diet, medication compliance, and 150 minutes of moderate intensity exercise per week as tolerated. Counseled on medication adherence and adverse effects. ?- Update BMP.  ?- Follow-up with primary provider in 4 months or sooner if needed.  ?- Basic Metabolic Panel ?- hydrochlorothiazide (HYDRODIURIL) 25 MG tablet; Take 1 tablet (25 mg total) by mouth daily.  Dispense: 120 tablet; Refill: 0 ?- valsartan (DIOVAN) 80 MG tablet; Take 1 tablet (80 mg total) by mouth daily.  Dispense: 120 tablet; Refill: 0 ? ?2. Hyperlipidemia, unspecified hyperlipidemia type: ?- Patient not fasting and plans to return at later date to update lipid panel. ?- Lipid Panel; Future ? ? ?Patient was given the opportunity to ask questions.  Patient verbalized understanding of the plan and was able to repeat key elements of the plan. Patient was given clear instructions to go to Emergency Department or return to medical center if symptoms don't improve, worsen, or new problems develop.The patient verbalized understanding. ? ? ?Orders Placed This Encounter  ?Procedures  ? Basic Metabolic Panel  ? Lipid Panel  ? ? ? ?Requested Prescriptions  ? ?Signed Prescriptions Disp Refills  ? hydrochlorothiazide (HYDRODIURIL) 25 MG tablet 120 tablet 0  ?  Sig: Take 1 tablet (25 mg total) by mouth daily.  ? valsartan (DIOVAN) 80 MG tablet 120 tablet 0  ?  Sig: Take 1 tablet (80 mg total) by mouth daily.  ? ? ?Return in about 4 months (around 01/15/2022) for Follow-Up or next available HTN. ? ?Camillia Herter, NP  ?

## 2021-09-15 ENCOUNTER — Encounter: Payer: Self-pay | Admitting: Family

## 2021-09-15 ENCOUNTER — Ambulatory Visit (INDEPENDENT_AMBULATORY_CARE_PROVIDER_SITE_OTHER): Payer: No Typology Code available for payment source | Admitting: Family

## 2021-09-15 VITALS — BP 127/81 | HR 75 | Temp 98.3°F | Resp 18 | Ht 69.02 in | Wt 217.0 lb

## 2021-09-15 DIAGNOSIS — I10 Essential (primary) hypertension: Secondary | ICD-10-CM | POA: Diagnosis not present

## 2021-09-15 DIAGNOSIS — E785 Hyperlipidemia, unspecified: Secondary | ICD-10-CM

## 2021-09-15 MED ORDER — HYDROCHLOROTHIAZIDE 25 MG PO TABS: 25.0000 mg | ORAL_TABLET | Freq: Every day | ORAL | 0 refills | Status: DC

## 2021-09-15 MED ORDER — VALSARTAN 80 MG PO TABS: 80.0000 mg | ORAL_TABLET | Freq: Every day | ORAL | 0 refills | Status: DC

## 2021-09-15 NOTE — Progress Notes (Signed)
Pt presents for hypertension follow-up  ?

## 2021-09-16 LAB — BASIC METABOLIC PANEL
BUN/Creatinine Ratio: 22 (ref 12–28)
BUN: 22 mg/dL (ref 8–27)
CO2: 25 mmol/L (ref 20–29)
Calcium: 9.8 mg/dL (ref 8.7–10.3)
Chloride: 103 mmol/L (ref 96–106)
Creatinine, Ser: 0.98 mg/dL (ref 0.57–1.00)
Glucose: 101 mg/dL — ABNORMAL HIGH (ref 70–99)
Potassium: 3.7 mmol/L (ref 3.5–5.2)
Sodium: 143 mmol/L (ref 134–144)
eGFR: 66 mL/min/{1.73_m2} (ref >59)

## 2021-09-16 NOTE — Progress Notes (Signed)
Kidney function and electrolytes normal.

## 2021-09-17 ENCOUNTER — Other Ambulatory Visit: Payer: No Typology Code available for payment source

## 2021-09-17 DIAGNOSIS — E785 Hyperlipidemia, unspecified: Secondary | ICD-10-CM

## 2021-09-17 NOTE — Progress Notes (Signed)
Lipid panel collected  

## 2021-09-18 ENCOUNTER — Other Ambulatory Visit: Payer: Self-pay | Admitting: Family

## 2021-09-18 DIAGNOSIS — E785 Hyperlipidemia, unspecified: Secondary | ICD-10-CM

## 2021-09-18 LAB — LIPID PANEL
Chol/HDL Ratio: 5.2 ratio — ABNORMAL HIGH (ref 0.0–4.4)
Cholesterol, Total: 298 mg/dL — ABNORMAL HIGH (ref 100–199)
HDL: 57 mg/dL (ref >39)
LDL Chol Calc (NIH): 217 mg/dL — ABNORMAL HIGH (ref 0–99)
Triglycerides: 130 mg/dL (ref 0–149)
VLDL Cholesterol Cal: 24 mg/dL (ref 5–40)

## 2021-09-18 NOTE — Progress Notes (Signed)
Cholesterol increased since 12 months ago. Considering likely side effects from past cholesterol medication will refer to [facility] for further evaluation and management. Their office should call within 2 weeks with appointment details.  ? ?The following is for provider reference only: ?The 10-year ASCVD risk score (Arnett DK, et al., 2019) is: 6.3% ?  Values used to calculate the score: ?    Age: 61 years ?    Sex: Female ?    Is Non-Hispanic African American: No ?    Diabetic: No ?    Tobacco smoker: No ?    Systolic Blood Pressure: 290 mmHg ?    Is BP treated: Yes ?    HDL Cholesterol: 57 mg/dL ?    Total Cholesterol: 298 mg/dL ?

## 2021-11-11 ENCOUNTER — Ambulatory Visit (INDEPENDENT_AMBULATORY_CARE_PROVIDER_SITE_OTHER): Payer: No Typology Code available for payment source

## 2021-11-11 ENCOUNTER — Ambulatory Visit
Admission: EM | Admit: 2021-11-11 | Discharge: 2021-11-11 | Disposition: A | Discharge status: Home / Self Care | Payer: No Typology Code available for payment source | Attending: Family Medicine | Admitting: Family Medicine

## 2021-11-11 DIAGNOSIS — R0602 Shortness of breath: Secondary | ICD-10-CM | POA: Insufficient documentation

## 2021-11-11 DIAGNOSIS — J069 Acute upper respiratory infection, unspecified: Secondary | ICD-10-CM | POA: Insufficient documentation

## 2021-11-11 DIAGNOSIS — J029 Acute pharyngitis, unspecified: Secondary | ICD-10-CM | POA: Diagnosis present

## 2021-11-11 LAB — POCT RAPID STREP A (OFFICE): Rapid Strep A Screen: NEGATIVE

## 2021-11-11 MED ORDER — ALBUTEROL SULFATE HFA 108 (90 BASE) MCG/ACT IN AERS: 1.0000 | INHALATION_SPRAY | RESPIRATORY_TRACT | 0 refills | Status: DC | PRN

## 2021-11-11 MED ORDER — ALBUTEROL SULFATE (2.5 MG/3ML) 0.083% IN NEBU: 2.5000 mg | INHALATION_SOLUTION | RESPIRATORY_TRACT | 0 refills | Status: DC | PRN

## 2021-11-11 MED ORDER — BENZONATATE 100 MG PO CAPS
100.0000 mg | ORAL_CAPSULE | Freq: Three times a day (TID) | ORAL | 0 refills | Status: DC | PRN
Start: 2021-11-11 — End: 2022-01-15

## 2021-11-11 MED ORDER — PREDNISONE 20 MG PO TABS: 40.0000 mg | ORAL_TABLET | Freq: Every day | ORAL | 0 refills | Status: AC

## 2021-11-11 NOTE — ED Triage Notes (Signed)
Patient presents to Urgent Care with complaints of congestion, cough, otalgia, sore throat since last night after getting caught in the rain on a walk. Patient reports no otc medications.

## 2021-11-11 NOTE — Discharge Instructions (Addendum)
Your chest x-ray was clear  Your strep test is negative.  Culture of the throat will be sent, and staff will notify you if that is in turn positive.   You have been swabbed for COVID, and the test will result in the next 24 hours. Our staff will call you if positive. If the test is positive, you should quarantine for 5 days.  Albuterol inhaler--do 2 puffs every 4 hours as needed for shortness of breath or wheezing.  This is for possible bronchospasm  Prednisone 20 mg--Take 2 daily for 5 days.  This is for possible inflammation in your lungs   Take benzonatate 100 mg, 1 tab every 8 hours as needed for cough.

## 2021-11-11 NOTE — ED Provider Notes (Addendum)
[facility]    CSN: [id number] Arrival date & time: 11/11/21  1049      History   Chief Complaint Chief Complaint  Patient presents with   Nasal Congestion    HPI Adriana Young is a 61 y.o. female.   HPI Here with increased nasal congestion, sore throat, and temperature to 101.7.  The symptoms began overnight.  She has had an ongoing cough for several months, and it has not really worsened in this last 24 hours.  She vomiting or diarrhea.  She does feel short of breath in the last 24 hours.  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      Home Medications    Prior to Admission medications   Medication Sig Start Date End Date Taking? Authorizing Provider  albuterol (PROVENTIL) (2.5 MG/3ML) 0.083% nebulizer solution Take 3 mLs (2.5 mg total) by nebulization every 4 (four) hours as needed for wheezing or shortness of breath. 11/11/21  Yes Liahm Grivas, Gwenlyn Perking, MD  benzonatate (TESSALON) 100 MG capsule Take 1 capsule (100 mg total) by mouth 3 (three) times daily as needed for cough. 11/11/21  Yes Zohal Reny, Gwenlyn Perking, MD  predniSONE  (DELTASONE) 20 MG tablet Take 2 tablets (40 mg total) by mouth daily with breakfast for 5 days. 11/11/21 11/16/21 Yes Precious Segall, Gwenlyn Perking, MD  acetaminophen (TYLENOL) 650 MG CR tablet Take 1,300 mg by mouth 2 (two) times daily.     [provider]  diazepam (VALIUM) 5 MG tablet 1 tablet by mouth prior to dental appointment, if surgery not prior to cleanings 10/05/17   [provider]  fluticasone (FLONASE) 50 MCG/ACT nasal spray Place 1 spray into both nostrils daily for 3 days. 08/06/21 08/09/21  Teodora Medici, FNP  hydrochlorothiazide (HYDRODIURIL) 25 MG tablet Take 1 tablet (25 mg total) by mouth daily. 09/15/21 01/13/22  Camillia Herter, NP  levothyroxine (SYNTHROID) 125 MCG tablet TAKE 1 TABLET BY MOUTH EVERY DAY 08/22/21   Camillia Herter, NP  Multiple Vitamins-Minerals ([employer] SPECTRAVITE PO) Take 1 tablet by mouth daily.    [provider]  omeprazole (PRILOSEC) 40 MG capsule TAKE 1 CAPSULE BY MOUTH EVERY DAY (BE SURE TO TAKE 1/2 HOUR BEFORE MEAL) 02/21/21   Camillia Herter, NP  Probiotic Product (PROBIOTIC DAILY PO) Take 1 capsule by mouth daily.    [provider]  valsartan (DIOVAN) 80 MG tablet Take 1 tablet (80 mg total) by mouth daily. 09/15/21 01/13/22  Camillia Herter, NP    Family History Family History  Problem Relation Age of Onset   Heart disease Mother    Diabetes Mother    Hypertension Mother    Alcohol abuse Other    Arthritis Other    Hyperlipidemia Other    Hypertension Other    Stroke Other        Female 1st degree relative < 60   Diabetes Maternal Grandmother    Colon cancer Neg Hx    Esophageal cancer Neg Hx    Stomach cancer Neg Hx    Rectal cancer Neg Hx     Social History Social History   Tobacco Use   Smoking status: Former    Packs/day: 1.00    Years: 25.00    Total pack years: 25.00    Types: Cigarettes    Quit date: 05/25/2000    Years since quitting: 21.4   Smokeless tobacco: Never  Vaping Use   Vaping Use: Some days    Start date: 11/26/2018  Substance Use Topics   Alcohol use: No   Drug use: No     Allergies   Bee venom, Other, and Poison ivy extract   Review of Systems Review of Systems   Physical Exam Triage Vital Signs ED Triage Vitals  Enc Vitals Group     BP      Pulse      Resp      Temp      Temp src      SpO2      Weight      Height      Head Circumference      Peak Flow      Pain Score      Pain Loc      Pain Edu?      Excl. in [place]?    No data found.  Updated Vital Signs LMP 10/25/2014   Visual Acuity Right Eye Distance:   Left Eye Distance:   Bilateral Distance:    Right Eye Near:   Left Eye Near:    Bilateral Near:     Physical Exam Vitals reviewed.  Constitutional:      General: She is not in acute distress.    Appearance: She is not toxic-appearing.  HENT:     Right Ear: Tympanic membrane and ear canal normal.     Left Ear: Tympanic membrane and ear canal normal.     Nose: Nose normal.     Mouth/Throat:     Mouth: Mucous membranes are moist.     Pharynx: No oropharyngeal exudate or posterior oropharyngeal erythema.  Eyes:     Extraocular Movements: Extraocular movements intact.     Conjunctiva/sclera: Conjunctivae normal.     Pupils: Pupils are equal, round, and reactive to light.  Cardiovascular:     Rate and Rhythm: Normal rate and regular rhythm.     Heart sounds: No murmur heard. Pulmonary:     Effort: Pulmonary effort is normal. No respiratory distress.     Breath sounds: No stridor. No wheezing, rhonchi or rales.     Comments: She has a deep barky cough in the room Musculoskeletal:     Cervical back: Neck supple.  Lymphadenopathy:     Cervical: No cervical adenopathy.  Skin:    Capillary Refill: Capillary refill takes less than 2 seconds.     Coloration: Skin is not jaundiced or pale.  Neurological:     General: No focal deficit present.     Mental Status: She is alert and oriented to person,  place, and time.  Psychiatric:         Behavior: Behavior normal.      UC Treatments / Results  Labs (all labs ordered are listed, but only abnormal results are displayed) Labs Reviewed  CULTURE, GROUP A STREP ([place])  NOVEL CORONAVIRUS, NAA  POCT RAPID STREP A (OFFICE)    EKG   Radiology DG Chest 2 View  Result Date: 11/11/2021 CLINICAL DATA:  Shortness of breath. EXAM: CHEST - 2 VIEW COMPARISON:  06/18/2021 FINDINGS: The lungs are clear without focal pneumonia, edema, pneumothorax or pleural effusion. Opacity in the right cardiophrenic angle is stable in the interval, compatible with fat pad seen on abdomen CT 11/19/2015 The cardiopericardial silhouette is within normal limits for size. The visualized bony structures of the thorax are unremarkable. IMPRESSION: No active cardiopulmonary disease. Electronically Signed   By: Misty Stanley M.D.   On: 11/11/2021 11:55    Procedures Procedures (including critical care time)  Medications Ordered in UC Medications - No data to display  Initial Impression / Assessment and Plan / UC Course  I have reviewed the triage vital signs and the nursing notes.  Pertinent labs & imaging results that were available during my care of the patient were reviewed by me and considered in my medical decision making (see chart for details).     X-ray is negative for infiltrate or fluid.  Rapid strep test is negative, so we will send culture and treat per protocol if positive  We will swab for COVID, and she is a candidate for oral antiviral treatment if positive.  Her GFR was normal the last time done, and she should have Paxlovid prescription if positive Final Clinical Impressions(s) / UC Diagnoses   Final diagnoses:  Sore throat  Shortness of breath  Viral URI     Discharge Instructions      Your chest x-ray was clear  Your strep test is negative.  Culture of the throat will be sent, and staff will notify you if that is in turn positive.   You have been swabbed for COVID,  and the test will result in the next 24 hours. Our staff will call you if positive. If the test is positive, you should quarantine for 5 days.  Albuterol inhaler--do 2 puffs every 4 hours as needed for shortness of breath or wheezing.  This is for possible bronchospasm  Prednisone 20 mg--Take 2 daily for 5 days.  This is for possible inflammation in your lungs   Take benzonatate 100 mg, 1 tab every 8 hours as needed for cough.      ED Prescriptions     Medication Sig Dispense Auth. Provider   benzonatate (TESSALON) 100 MG capsule Take 1 capsule (100 mg total) by mouth 3 (three) times daily as needed for cough. 21 capsule Barrett Henle, MD   albuterol (PROVENTIL) (2.5 MG/3ML) 0.083% nebulizer solution Take 3 mLs (2.5 mg total) by nebulization every 4 (four) hours as needed for wheezing or shortness of breath. 225 mL Barrett Henle, MD   predniSONE (DELTASONE) 20 MG tablet Take 2 tablets (40 mg total) by mouth daily with breakfast for 5 days. 10 tablet Windy Carina Gwenlyn Perking, MD      PDMP not reviewed this encounter.   Barrett Henle, MD 11/11/21 1205    Barrett Henle, MD 11/11/21 [phone]

## 2021-11-12 LAB — NOVEL CORONAVIRUS, NAA: SARS-CoV-2, NAA: NOT DETECTED

## 2021-11-14 LAB — CULTURE, GROUP A STREP (THRC)

## 2021-11-22 ENCOUNTER — Ambulatory Visit
Admission: EM | Admit: 2021-11-22 | Discharge: 2021-11-22 | Disposition: A | Discharge status: Other Acute Inpatient Hospital | Payer: No Typology Code available for payment source | Attending: Internal Medicine | Admitting: Internal Medicine

## 2021-11-22 ENCOUNTER — Emergency Department (HOSPITAL_COMMUNITY): Payer: No Typology Code available for payment source

## 2021-11-22 ENCOUNTER — Encounter (HOSPITAL_COMMUNITY): Payer: Self-pay | Admitting: *Deleted

## 2021-11-22 ENCOUNTER — Other Ambulatory Visit: Payer: Self-pay

## 2021-11-22 ENCOUNTER — Emergency Department (HOSPITAL_COMMUNITY)
Admission: EM | Admit: 2021-11-22 | Discharge: 2021-11-22 | Disposition: A | Discharge status: Home / Self Care | Payer: No Typology Code available for payment source | Attending: Emergency Medicine | Admitting: Emergency Medicine

## 2021-11-22 DIAGNOSIS — R053 Chronic cough: Secondary | ICD-10-CM

## 2021-11-22 DIAGNOSIS — Z79899 Other long term (current) drug therapy: Secondary | ICD-10-CM | POA: Diagnosis not present

## 2021-11-22 DIAGNOSIS — J441 Chronic obstructive pulmonary disease with (acute) exacerbation: Secondary | ICD-10-CM | POA: Insufficient documentation

## 2021-11-22 DIAGNOSIS — R0989 Other specified symptoms and signs involving the circulatory and respiratory systems: Secondary | ICD-10-CM

## 2021-11-22 DIAGNOSIS — J069 Acute upper respiratory infection, unspecified: Secondary | ICD-10-CM

## 2021-11-22 DIAGNOSIS — Z7951 Long term (current) use of inhaled steroids: Secondary | ICD-10-CM | POA: Insufficient documentation

## 2021-11-22 DIAGNOSIS — R059 Cough, unspecified: Secondary | ICD-10-CM | POA: Diagnosis present

## 2021-11-22 DIAGNOSIS — F17200 Nicotine dependence, unspecified, uncomplicated: Secondary | ICD-10-CM | POA: Insufficient documentation

## 2021-11-22 MED ORDER — PREDNISONE 10 MG PO TABS: ORAL_TABLET | ORAL | 0 refills | Status: DC

## 2021-11-22 MED ORDER — IPRATROPIUM-ALBUTEROL 0.5-2.5 (3) MG/3ML IN SOLN
3.0000 mL | Freq: Once | RESPIRATORY_TRACT | Status: AC
  Administered 2021-11-22: 3 mL via RESPIRATORY_TRACT

## 2021-11-22 MED ORDER — AMOXICILLIN-POT CLAVULANATE 875-125 MG PO TABS: 1.0000 | ORAL_TABLET | Freq: Two times a day (BID) | ORAL | 0 refills | Status: DC

## 2021-11-22 NOTE — ED Provider Notes (Signed)
[facility]    CSN: [id number] Arrival date & time: 11/22/21  1002      History   Chief Complaint Chief Complaint  Patient presents with   Cough    HPI Adriana Young is a 61 y.o. female.   Patient presents of nasal congestion and persistent and harsh cough that has been present for approximately 2 weeks.  She does report some intermittent shortness of breath as well.  Denies history of asthma or COPD but does report a history of smoking.  She states that she smoked approximately 4 packs of cigarettes daily from the ages of [age].  Her mother is currently diagnosed with viral pneumonia and has been hospitalized.  Patient was seen on 11/11/2021 and was prescribed albuterol inhaler and prednisone with no improvement in symptoms.  She has also taken over-the-counter Mucinex with no improvement.  Denies chest pain, sore throat, ear pain, nausea vomiting, diarrhea, abdominal pain.  Denies any known fevers at home.  She states that her oxygen was 88% when she woke up this morning.   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      Home Medications    Prior  to Admission medications   Medication Sig Start Date End Date Taking? Authorizing Provider  acetaminophen (TYLENOL) 650 MG CR tablet Take 1,300 mg by mouth 2 (two) times daily.     [provider]  albuterol (VENTOLIN HFA) 108 (90 Base) MCG/ACT inhaler Inhale 1-2 puffs into the lungs every 4 (four) hours as needed for wheezing or shortness of breath. 11/11/21   Banister, Gwenlyn Perking, MD  benzonatate (TESSALON) 100 MG capsule Take 1 capsule (100 mg total) by mouth 3 (three) times daily as needed for cough. 11/11/21   Barrett Henle, MD  diazepam (VALIUM) 5 MG tablet 1 tablet by mouth prior to dental appointment, if surgery not prior to cleanings 10/05/17   [provider]  fluticasone (FLONASE) 50 MCG/ACT nasal spray Place 1 spray into both nostrils daily for 3 days. 08/06/21 08/09/21  Teodora Medici, FNP  hydrochlorothiazide (HYDRODIURIL) 25 MG tablet Take 1 tablet (25 mg total) by mouth daily. 09/15/21 01/13/22  Camillia Herter, NP  levothyroxine (SYNTHROID) 125 MCG tablet TAKE 1 TABLET BY MOUTH EVERY DAY 08/22/21   Camillia Herter, NP  Multiple Vitamins-Minerals ([employer] SPECTRAVITE PO) Take 1 tablet by mouth daily.    [provider]  omeprazole (PRILOSEC) 40 MG capsule TAKE 1 CAPSULE BY MOUTH EVERY DAY (BE SURE TO TAKE 1/2 HOUR BEFORE MEAL) 02/21/21   Camillia Herter,  NP  Probiotic Product (PROBIOTIC DAILY PO) Take 1 capsule by mouth daily.    [provider]  valsartan (DIOVAN) 80 MG tablet Take 1 tablet (80 mg total) by mouth daily. 09/15/21 01/13/22  Camillia Herter, NP    Family History Family History  Problem Relation Age of Onset   Heart disease Mother    Diabetes Mother    Hypertension Mother    Alcohol abuse Other    Arthritis Other    Hyperlipidemia Other    Hypertension Other    Stroke Other        Female 1st degree relative < 60   Diabetes Maternal Grandmother    Colon cancer Neg Hx    Esophageal cancer Neg Hx    Stomach cancer Neg Hx    Rectal  cancer Neg Hx     Social History Social History   Tobacco Use   Smoking status: Former    Packs/day: 1.00    Years: 25.00    Total pack years: 25.00    Types: Cigarettes    Quit date: 05/25/2000    Years since quitting: 21.5   Smokeless tobacco: Never  Vaping Use   Vaping Use: Some days   Start date: 11/26/2018  Substance Use Topics   Alcohol use: No   Drug use: No     Allergies   Bee venom, Other, and Poison ivy extract   Review of Systems Review of Systems Per HPI  Physical Exam Triage Vital Signs ED Triage Vitals  Enc Vitals Group     BP 11/22/21 1033 132/81     Pulse Rate 11/22/21 1033 95     Resp 11/22/21 1033 18     Temp 11/22/21 1033 98.2 F (36.8 C)     Temp src --      SpO2 11/22/21 1033 93 %     Weight --      Height --      Head Circumference --      Peak Flow --      Pain Score 11/22/21 1032 0     Pain Loc --      Pain Edu? --      Excl. in [place]? --    No data found.  Updated Vital Signs BP 132/81   Pulse 95   Temp 98.2 F (36.8 C)   Resp 18   LMP 10/25/2014   SpO2 93%   Visual Acuity Right Eye Distance:   Left Eye Distance:   Bilateral Distance:    Right Eye Near:   Left Eye Near:    Bilateral Near:     Physical Exam Constitutional:      General: She is not in acute distress.    Appearance: Normal appearance. She is not toxic-appearing or diaphoretic.  HENT:     Head: Normocephalic and atraumatic.     Right Ear: Tympanic membrane and ear canal normal.     Left Ear: Tympanic membrane and ear canal normal.     Nose: Congestion present.     Mouth/Throat:     Mouth: Mucous membranes are moist.     Pharynx: No posterior oropharyngeal erythema.  Eyes:     Extraocular Movements: Extraocular movements intact.     Conjunctiva/sclera: Conjunctivae normal.     Pupils: Pupils are equal, round, and reactive to light.  Cardiovascular:     Rate and Rhythm: Normal rate and regular rhythm.     Pulses: Normal pulses.     Heart sounds:  Normal heart sounds.  Pulmonary:     Effort: Pulmonary effort is normal. No respiratory distress.     Breath sounds: No stridor. Rhonchi present. No wheezing or rales.     Comments: Diminished breath sounds bilaterally prior to nebulizer treatment.  Rhonchi bilaterally on second physical exam. Abdominal:     General: Abdomen is flat. Bowel sounds are normal.     Palpations: Abdomen is soft.  Musculoskeletal:        General: Normal range of motion.     Cervical back: Normal range of motion.  Skin:    General: Skin is warm and dry.  Neurological:     General: No focal deficit present.     Mental Status: She is alert and oriented to person, place, and time. Mental status is at baseline.  Psychiatric:        Mood and Affect: Mood normal.        Behavior: Behavior normal.      UC Treatments / Results  Labs (all labs ordered are listed, but only abnormal results are displayed) Labs Reviewed - No data to display  EKG   Radiology No results found.  Procedures Procedures (including critical care time)  Medications Ordered in UC Medications  ipratropium-albuterol (DUONEB) 0.5-2.5 (3) MG/3ML nebulizer solution 3 mL (3 mLs Nebulization Given 11/22/21 1056)    Initial Impression / Assessment and Plan / UC Course  I have reviewed the triage vital signs and the nursing notes.  Pertinent labs & imaging results that were available during my care of the patient were reviewed by me and considered in my medical decision making (see chart for details).     Patient's oxygen saturation was ranging from 91 to 93% during initial triage and physical exam.  DuoNeb was administered to assess if this will be helpful with oxygen and patient's lung sounds and symptoms.  Oxygen originally increased to 98% but did not sustain and did decrease back down to 91%.  Lung sounds on original exam were diminished and after DuoNeb treatment, rhonchi was noted on lungs exam.  Due to low oxygen saturation and  minimal improvement with nebulizer treatment, patient was advised that it would be best to go to the hospital for further evaluation and management given limited resources here for evaluation and treatment in urgent care.  Patient was agreeable with plan.  Patient wished to leave via self transport.  Risks associated with going via self transport discussed with patient.  Patient voiced understanding.  Patient left to go to the hospital for further evaluation and management. Final Clinical Impressions(s) / UC Diagnoses   Final diagnoses:  Persistent cough  Acute upper respiratory infection  Abnormal lung sounds     Discharge Instructions      Please go to the emergency department as soon as you leave urgent care for further evaluation and management.    ED Prescriptions   None    PDMP not reviewed this encounter.   Teodora Medici, [place] 11/22/21 1157

## 2021-11-22 NOTE — Discharge Instructions (Addendum)
Your oxygen level here at rest is between 92 to 96%.  This is normal.  If you are feeling short of breath with exertion, sit down and rest.  Use your albuterol inhaler 2 puffs every 3-4 hours as needed for cough or trouble breathing.  Start the medicine prescribed, to help you get better.  We are referring you to a pulmonologist to see for further evaluation and treatment.  Call them for an appointment

## 2021-11-22 NOTE — ED Triage Notes (Signed)
Cough for 2 weeks with decreased 0 2 sats, went to UC today received breathing treatment. Sats remained low.

## 2021-11-22 NOTE — ED Triage Notes (Signed)
Patient presents to Urgent Care with complaints of cough for 2 weeks Patient reports her mother is hospitalized with viral pneumonia.

## 2021-11-22 NOTE — ED Provider Notes (Signed)
[facility] DEPT Provider Note   CSN: [id number] Arrival date & time: 11/22/21  1225     History  Chief Complaint  Patient presents with   Cough    Adriana Young is a 61 y.o. female.  HPI She presents for evaluation of cough, sputum production and borderline low oxygenation.  She has an oxygen saturation monitor at home to help manage her mother.  She does not typically take her oxygen measurement.  Today she checked it and it was 88%.  She went to her PCP appointment and they found her to be with a borderline low oxygenation therefore sent her here.  She has been ill for 2 weeks with cough, productive of yellow sputum.  She had a similar episode, 11 days ago ago, which was treated with prednisone and albuterol inhaler.  She is an ex-smoker, but stopped 14 years ago.  She has been otherwise well recently.  Her mother is currently hospitalized with "viral pneumonia."  She is currently employed in a retail setting.    Home Medications Prior to Admission medications   Medication Sig Start Date End Date Taking? Authorizing Provider  amoxicillin-clavulanate (AUGMENTIN) 875-125 MG tablet Take 1 tablet by mouth every 12 (twelve) hours. 11/22/21  Yes Daleen Bo, MD  predniSONE (DELTASONE) 10 MG tablet Take q day 6,5,4,3,2,1 11/22/21  Yes Daleen Bo, MD  acetaminophen (TYLENOL) 650 MG CR tablet Take 1,300 mg by mouth 2 (two) times daily.     [provider]  albuterol (VENTOLIN HFA) 108 (90 Base) MCG/ACT inhaler Inhale 1-2 puffs into the lungs every 4 (four) hours as needed for wheezing or shortness of breath. 11/11/21   Banister, Gwenlyn Perking, MD  benzonatate (TESSALON) 100 MG capsule Take 1 capsule (100 mg total) by mouth 3 (three) times daily as needed for cough. 11/11/21   Barrett Henle, MD  diazepam (VALIUM) 5 MG tablet 1 tablet by mouth prior to dental appointment, if surgery not prior to cleanings 10/05/17   [provider]  fluticasone  (FLONASE) 50 MCG/ACT nasal spray Place 1 spray into both nostrils daily for 3 days. 08/06/21 08/09/21  Teodora Medici, FNP  hydrochlorothiazide (HYDRODIURIL) 25 MG tablet Take 1 tablet (25 mg total) by mouth daily. 09/15/21 01/13/22  Camillia Herter, NP  levothyroxine (SYNTHROID) 125 MCG tablet TAKE 1 TABLET BY MOUTH EVERY DAY 08/22/21   Camillia Herter, NP  Multiple Vitamins-Minerals ([employer] SPECTRAVITE PO) Take 1 tablet by mouth daily.    [provider]  omeprazole (PRILOSEC) 40 MG capsule TAKE 1 CAPSULE BY MOUTH EVERY DAY (BE SURE TO TAKE 1/2 HOUR BEFORE MEAL) 02/21/21   Camillia Herter, NP  Probiotic Product (PROBIOTIC DAILY PO) Take 1 capsule by mouth daily.    [provider]  valsartan (DIOVAN) 80 MG tablet Take 1 tablet (80 mg total) by mouth daily. 09/15/21 01/13/22  Camillia Herter, NP      Allergies    Bee venom, Other, and Poison ivy extract    Review of Systems   Review of Systems  Physical Exam Updated Vital Signs BP (!) 147/86   Pulse 99   Temp 99.2 F (37.3 C) (Oral)   Resp 16   LMP 10/25/2014   SpO2 94%  Physical Exam Vitals and nursing note reviewed.  Constitutional:      General: She is not in acute distress.    Appearance: She is well-developed. She is not ill-appearing, toxic-appearing or diaphoretic.  HENT:  Head: Normocephalic and atraumatic.     Right Ear: External ear normal.     Left Ear: External ear normal.  Eyes:     Conjunctiva/sclera: Conjunctivae normal.     Pupils: Pupils are equal, round, and reactive to light.  Neck:     Trachea: Phonation normal.  Cardiovascular:     Rate and Rhythm: Normal rate and regular rhythm.  Pulmonary:     Effort: Pulmonary effort is normal. No respiratory distress.     Breath sounds: No stridor.  Abdominal:     General: There is no distension.     Palpations: Abdomen is soft.     Tenderness: There is no abdominal tenderness.  Musculoskeletal:        General: Normal range of motion.     Cervical  back: Normal range of motion and neck supple.  Skin:    General: Skin is warm and dry.  Neurological:     Mental Status: She is alert and oriented to person, place, and time.     Cranial Nerves: No cranial nerve deficit.     Sensory: No sensory deficit.     Motor: No abnormal muscle tone.     Coordination: Coordination normal.  Psychiatric:        Mood and Affect: Mood normal.        Behavior: Behavior normal.        Thought Content: Thought content normal.        Judgment: Judgment normal.     ED Results / Procedures / Treatments   Labs (all labs ordered are listed, but only abnormal results are displayed) Labs Reviewed - No data to display  EKG None  Radiology DG Chest 2 View  Result Date: 11/22/2021 CLINICAL DATA:  cough EXAM: CHEST - 2 VIEW COMPARISON:  Radiograph dated November 11, 2021 FINDINGS: The cardiomediastinal silhouette is unchanged in contour. No pleural effusion. No pneumothorax. No acute pleuroparenchymal abnormality. Visualized abdomen is unremarkable. Multilevel degenerative changes of the thoracic spine. IMPRESSION: No acute cardiopulmonary abnormality. Electronically Signed   By: Valentino Saxon M.D.   On: 11/22/2021 13:21    Procedures Procedures    Medications Ordered in ED Medications - No data to display  ED Course/ Medical Decision Making/ A&P                           Medical Decision Making She presents for evaluation of chest and cough 2 weeks despite being treated with albuterol inhaler and prednisone, 11 days ago.  She is only using the inhaler sporadically.  She completed a course of burst therapy prednisone.  She does not have ongoing similar symptoms.  She is an ex smoker.  Problems Addressed: COPD exacerbation ([place]): acute illness or injury    Details: Clinically diagnostic for COPD exacerbation due to prolonged tobacco abuse.  Amount and/or Complexity of Data Reviewed Independent Historian:     Details: She is a cogent  historian Radiology: ordered and independent interpretation performed.    Details: Chest x-ray-normal; no infiltrate or edema  Risk Prescription drug management. Decision regarding hospitalization. Risk Details: Patient was EPIC smoker is presenting with persistent sputum production, discolored, and shortness of breath with hypoxia.  Hypoxia improves with rest.  Is an ex-smoker suspect that she is having COPD exacerbation.  She will be treated additionally with antibiotic, and taper dose prednisone, with continuation of albuterol to use as needed.  I suspect she will need ongoing management by  pulmonologist.  We will refer her to a pulmonologist as an outpatient.  She does not require hospitalization at this time.  Oxygenation 90 to 96% on room air.           Final Clinical Impression(s) / ED Diagnoses Final diagnoses:  COPD exacerbation ([place])    Rx / DC Orders ED Discharge Orders          Ordered    predniSONE (DELTASONE) 10 MG tablet        11/22/21 1550    amoxicillin-clavulanate (AUGMENTIN) 875-125 MG tablet  Every 12 hours        11/22/21 1550              Daleen Bo, MD 11/22/21 1603

## 2021-11-22 NOTE — Discharge Instructions (Signed)
Please go to the emergency department as soon as you leave urgent care for further evaluation and management. ?

## 2021-11-22 NOTE — ED Notes (Signed)
Patient is being discharged from the Urgent Care and sent to the Emergency Department via pov . Per mound, np, patient is in need of higher level of care due to symptoms. Patient is aware and verbalizes understanding of plan of care.  Vitals:   11/22/21 1033  BP: 132/81  Pulse: 95  Resp: 18  Temp: 98.2 F (36.8 C)  SpO2: 93%

## 2021-11-26 ENCOUNTER — Encounter: Payer: Self-pay | Admitting: Internal Medicine

## 2021-11-26 ENCOUNTER — Ambulatory Visit (INDEPENDENT_AMBULATORY_CARE_PROVIDER_SITE_OTHER): Payer: No Typology Code available for payment source | Admitting: Internal Medicine

## 2021-11-26 VITALS — BP 168/99 | HR 76 | Ht 69.0 in | Wt 217.0 lb

## 2021-11-26 DIAGNOSIS — Z720 Tobacco use: Secondary | ICD-10-CM | POA: Diagnosis not present

## 2021-11-26 DIAGNOSIS — E785 Hyperlipidemia, unspecified: Secondary | ICD-10-CM

## 2021-11-26 DIAGNOSIS — I10 Essential (primary) hypertension: Secondary | ICD-10-CM

## 2021-11-26 MED ORDER — ATORVASTATIN CALCIUM 40 MG PO TABS: 40.0000 mg | ORAL_TABLET | Freq: Every day | ORAL | 3 refills | Status: DC

## 2021-11-26 NOTE — Patient Instructions (Signed)
Medication Instructions:  START atorvastatin '40mg'$  daily   *If you need a refill on your cardiac medications before your next appointment, please call your pharmacy*   Lab Work: FASTING lab work to check cholesterol in about 3-4 months  If you have labs (blood work) drawn today and your tests are completely normal, you will receive your results only by: [place] (if you have MyChart) OR A paper copy in the mail If you have any lab test that is abnormal or we need to change your treatment, we will call you to review the results.   Follow-Up: At [facility], you and your health needs are our priority.  As part of our continuing mission to provide you with exceptional heart care, we have created designated Provider Care Teams.  These Care Teams include your primary Cardiologist (physician) and Advanced Practice Providers (APPs -  Physician Assistants and Nurse Practitioners) who all work together to provide you with the care you need, when you need it.  We recommend signing up for the patient portal called "MyChart".  Sign up information is provided on this After Visit Summary.  MyChart is used to connect with patients for Virtual Visits (Telemedicine).  Patients are able to view lab/test results, encounter notes, upcoming appointments, etc.  Non-urgent messages can be sent to your provider as well.   To learn more about what you can do with MyChart, go to [email or web address].    Your next appointment:    3-4 months with Dr. Debara Pickett - lipid clinic

## 2021-11-26 NOTE — Progress Notes (Signed)
LIPID CLINIC CONSULT NOTE  Chief Complaint:  Manage dyslipidemia  Primary Care Physician: Camillia Herter, NP  Primary Cardiologist:  None  HPI:  Adriana Young is a 61 y.o. female who is being seen today for the evaluation of dyslipidemia at the request of Camillia Herter, NP. This is a pleasant 61 year old female kindly referred for evaluation management of dyslipidemia.  She has a history of hypertension on 2 medications but otherwise few medical problems.  Recently she has been having some pulmonary issues including hypoxia and upper airway wheezing.  This is responded to antibiotics and steroids.  She was on steroids in April for an acute exacerbation.  At that time her lipids were markedly elevated with total cholesterol 298, triglycerides 130, HDL 57 LDL 217.  Prior to this however her cholesterol was quite low in March 2022 with total 182, triglycerides 136, HDL 52 and LDL 106.  She reported she was on Crestor at this time but then she had some significant cramping in her legs which caused her to discontinue the medication.  Subsequently she was referred for lipid management.  She reports she is currently on steroids and antibiotics again after recent ER visit.  PMHx:  Past Medical History:  Diagnosis Date   Allergy    Arthritis    Phreesia 06/30/2020   GERD (gastroesophageal reflux disease)    Glaucoma    Headache(784.0)    Hyperlipidemia    Hypertension    Osteoarthritis    Thyroid disease    hypo   Type II or unspecified type diabetes mellitus without mention of complication, uncontrolled 09/19/2010    Past Surgical History:  Procedure Laterality Date   BREAST SURGERY     Lumpectomy- left breast, no lymph node involvement    FAMHx:  Family History  Problem Relation Age of Onset   Heart disease Mother    Diabetes Mother    Hypertension Mother    Alcohol abuse Other    Arthritis Other    Hyperlipidemia Other    Hypertension Other    Stroke Other        Female  1st degree relative < 60   Diabetes Maternal Grandmother    Colon cancer Neg Hx    Esophageal cancer Neg Hx    Stomach cancer Neg Hx    Rectal cancer Neg Hx     SOCHx:   reports that she quit smoking about 21 years ago. Her smoking use included cigarettes. She has a 25.00 pack-year smoking history. She has never used smokeless tobacco. She reports that she does not drink alcohol and does not use drugs.  ALLERGIES:  Allergies  Allergen Reactions   Bee Venom Anaphylaxis   Other Hives, Shortness Of Breath, Itching, Swelling and Rash    Bee stings    Crestor [Rosuvastatin]     Myalgias    Poison Ivy Extract Rash    ROS: Pertinent items noted in HPI and remainder of comprehensive ROS otherwise negative.  HOME MEDS: Current Outpatient Medications on File Prior to Visit  Medication Sig Dispense Refill   acetaminophen (TYLENOL) 650 MG CR tablet Take 1,300 mg by mouth 2 (two) times daily.      albuterol (VENTOLIN HFA) 108 (90 Base) MCG/ACT inhaler Inhale 1-2 puffs into the lungs every 4 (four) hours as needed for wheezing or shortness of breath. 1 each 0   amoxicillin-clavulanate (AUGMENTIN) 875-125 MG tablet Take 1 tablet by mouth every 12 (twelve) hours. 14 tablet 0  benzonatate (TESSALON) 100 MG capsule Take 1 capsule (100 mg total) by mouth 3 (three) times daily as needed for cough. 21 capsule 0   diazepam (VALIUM) 5 MG tablet 1 tablet by mouth prior to dental appointment, if surgery not prior to cleanings  0   fluticasone (FLONASE) 50 MCG/ACT nasal spray Place 1 spray into both nostrils daily for 3 days. 16 g 0   hydrochlorothiazide (HYDRODIURIL) 25 MG tablet Take 1 tablet (25 mg total) by mouth daily. 120 tablet 0   levothyroxine (SYNTHROID) 125 MCG tablet TAKE 1 TABLET BY MOUTH EVERY DAY 90 tablet 0   Multiple Vitamins-Minerals ([employer] SPECTRAVITE PO) Take 1 tablet by mouth daily.     omeprazole (PRILOSEC) 40 MG capsule TAKE 1 CAPSULE BY MOUTH EVERY DAY (BE SURE TO TAKE 1/2 HOUR  BEFORE MEAL) 90 capsule 0   predniSONE (DELTASONE) 10 MG tablet Take q day 6,5,4,3,2,1 21 tablet 0   Probiotic Product (PROBIOTIC DAILY PO) Take 1 capsule by mouth daily.     valsartan (DIOVAN) 80 MG tablet Take 1 tablet (80 mg total) by mouth daily. 120 tablet 0   No current facility-administered medications on file prior to visit.    LABS/IMAGING: No results found for this or any previous visit (from the past 48 hour(s)). No results found.  LIPID PANEL:    Component Value Date/Time   CHOL 298 (H) 09/17/2021 0926   TRIG 130 09/17/2021 0926   HDL 57 09/17/2021 0926   CHOLHDL 5.2 (H) 09/17/2021 0926   CHOLHDL 3.6 06/22/2019 0805   VLDL 22.4 10/27/2016 1515   LDLCALC 217 (H) 09/17/2021 0926   LDLCALC 121 (H) 06/22/2019 0805   LDLDIRECT 189.4 07/18/2009 1037    WEIGHTS: Wt Readings from Last 3 Encounters:  11/26/21 217 lb (98.4 kg)  09/15/21 217 lb (98.4 kg)  08/06/21 210 lb 15.7 oz (95.7 kg)    VITALS: BP (!) 168/99   Pulse 76   Ht '5\' 9"'$  (1.753 m)   Wt 217 lb (98.4 kg)   LMP 10/25/2014   SpO2 98%   BMI 32.05 kg/m   EXAM: General appearance: alert and no distress Neck: no carotid bruit, no JVD, and thyroid not enlarged, symmetric, no tenderness/mass/nodules Lungs: clear to auscultation bilaterally Heart: regular rate and rhythm, S1, S2 normal, no murmur, click, rub or gallop Abdomen: soft, non-tender; bowel sounds normal; no masses,  no organomegaly Extremities: extremities normal, atraumatic, no cyanosis or edema Pulses: 2+ and symmetric Skin: Skin color, texture, turgor normal. No rashes or lesions Neurologic: Grossly normal Psych: Pleasant  EKG: N/A  ASSESSMENT: Mixed dyslipidemia Cramps with rosuvastatin Hypertension Tobacco abuse  PLAN: 1.   Adriana Young has a mixed dyslipidemia but cannot tolerate rosuvastatin due to cramping.  She thinks she remotely was on atorvastatin but does not recall if she had side effects with it.  I would advise trying  atorvastatin 40 mg nightly.  If she develops side effects with this I encouraged her to reach out and we may need to consider other options.  I do not think she has enough cardiovascular disease to warrant a PCSK9 inhibitor.  Blood pressure was elevated today.  She was seen in the ER and started on steroids and antibiotics just 4 days ago.  The steroids are probably also contributing to her high cholesterol.  She notes a family history of heart disease in her mother.  We will plan repeat lipids in about 3 to 4 months and follow-up afterwards.  Thanks for  the kind referral.  Pixie Casino, MD, FACC, [place] Director of the [facility] Lip[facility]  Cardiovascular Risk Reduction [facility] Diplomate of the American Board of Clinical Lipidology Attending Cardiologist  Direct Dial: [phone]  Fax: [phone]  Website:  [email or web address] 11/26/2021, 4:18 PM

## 2021-12-27 ENCOUNTER — Other Ambulatory Visit: Payer: Self-pay | Admitting: Family

## 2021-12-29 MED ORDER — LEVOTHYROXINE SODIUM 125 MCG PO TABS: 125.0000 ug | ORAL_TABLET | Freq: Every day | ORAL | 0 refills | Status: DC

## 2022-01-06 NOTE — Progress Notes (Signed)
Patient ID: Adriana Young, female    DOB: 09/24/1960  MRN: [id number]  CC: Prediabetes Checkup  Subjective: Adriana Young is a 61 y.o. female who presents for prediabetes checkup.   Her concerns today include:  No issues/concerns. She is established with Cardiology.   Patient Active Problem List   Diagnosis Date Noted   Bacterial vaginitis 02/24/2021   Degenerative joint disease (DJD) of hip 10/14/2015   Fatty liver 09/19/2010   Hypothyroidism 06/18/2008   Hyperlipidemia with target LDL less than 100 06/04/2008   Essential hypertension 06/04/2008   GERD 06/04/2008   Generalized OA 06/04/2008     Current Outpatient Medications on File Prior to Visit  Medication Sig Dispense Refill   acetaminophen (TYLENOL) 650 MG CR tablet Take 1,300 mg by mouth 2 (two) times daily.      albuterol (VENTOLIN HFA) 108 (90 Base) MCG/ACT inhaler Inhale 1-2 puffs into the lungs every 4 (four) hours as needed for wheezing or shortness of breath. 1 each 0   atorvastatin (LIPITOR) 40 MG tablet Take 1 tablet (40 mg total) by mouth daily. 90 tablet 3   diazepam (VALIUM) 5 MG tablet 1 tablet by mouth prior to dental appointment, if surgery not prior to cleanings  0   fluticasone (FLONASE) 50 MCG/ACT nasal spray Place 1 spray into both nostrils daily for 3 days. 16 g 0   hydrochlorothiazide (HYDRODIURIL) 25 MG tablet Take 1 tablet (25 mg total) by mouth daily. 120 tablet 0   levothyroxine (SYNTHROID) 125 MCG tablet Take 1 tablet (125 mcg total) by mouth daily. 90 tablet 0   Multiple Vitamins-Minerals ([employer] SPECTRAVITE PO) Take 1 tablet by mouth daily.     omeprazole (PRILOSEC) 40 MG capsule TAKE 1 CAPSULE BY MOUTH EVERY DAY (BE SURE TO TAKE 1/2 HOUR BEFORE MEAL) 90 capsule 0   Probiotic Product (PROBIOTIC DAILY PO) Take 1 capsule by mouth daily.     valsartan (DIOVAN) 80 MG tablet TAKE 1 TABLET BY MOUTH EVERY DAY 90 tablet 0   No current facility-administered medications on file prior to visit.    Allergies   Allergen Reactions   Bee Venom Anaphylaxis   Other Hives, Shortness Of Breath, Itching, Swelling and Rash    Bee stings    Crestor [Rosuvastatin]     Myalgias    Poison Ivy Extract Rash    Social History   Socioeconomic History   Marital status: Married    Spouse name: Not on file   Number of children: Not on file   Years of education: Not on file   Highest education level: Not on file  Occupational History   Not on file  Tobacco Use   Smoking status: Former    Packs/day: 1.00    Years: 25.00    Total pack years: 25.00    Types: Cigarettes    Quit date: 05/25/2000    Years since quitting: 21.6    Passive exposure: Past   Smokeless tobacco: Never  Vaping Use   Vaping Use: Some days   Start date: 11/26/2018  Substance and Sexual Activity   Alcohol use: No   Drug use: No   Sexual activity: Yes  Other Topics Concern   Not on file  Social History Narrative   Social History      Diet? Just watching- cutting out bread, drinking water      Do you drink/eat things with caffeine? yes      Marital status?  married                            What year were you married? 1980      Do you live in a house, apartment, assisted living, condo, trailer, etc.? house      Is it one or more stories? one      How many persons live in your home? 3      Do you have any pets in your home? (please list) yes 2 dogs, 3 cats, 3 fish      Highest level of education completed? 2 year college      Current or past profession:      Do you exercise?         yes                             Type & how often? gym      Advanced Directives      Do you have a living will? yes      Do you have a DNR form?                                  If not, do you want to discuss one? no      Do you have signed POA/HPOA for forms? yes      Functional Status      Do you have difficulty bathing or dressing yourself? no      Do you have difficulty preparing food or eating? no      Do you have  difficulty managing your medications? no      Do you have difficulty managing your finances? no      Do you have difficulty affording your medications? no   Social Determinants of Health   Financial Resource Strain: Not on file  Food Insecurity: Not on file  Transportation Needs: Not on file  Physical Activity: Not on file  Stress: Not on file  Social Connections: Not on file  Intimate Partner Violence: Not on file    Family History  Problem Relation Age of Onset   Heart disease Mother    Diabetes Mother    Hypertension Mother    Alcohol abuse Other    Arthritis Other    Hyperlipidemia Other    Hypertension Other    Stroke Other        Female 1st degree relative < 60   Diabetes Maternal Grandmother    Colon cancer Neg Hx    Esophageal cancer Neg Hx    Stomach cancer Neg Hx    Rectal cancer Neg Hx     Past Surgical History:  Procedure Laterality Date   BREAST SURGERY     Lumpectomy- left breast, no lymph node involvement    ROS: Review of Systems Negative except as stated above  PHYSICAL EXAM: BP 120/86 (BP Location: Left Arm, Patient Position: Sitting, Cuff Size: Large)   Pulse 82   Temp 98.3 F (36.8 C)   Resp 18   Ht 5' 9.02" (1.753 m)   Wt 215 lb (97.5 kg)   LMP 10/25/2014   SpO2 97%   BMI 31.74 kg/m   Physical Exam HENT:     Head: Normocephalic and atraumatic.  Eyes:     Extraocular Movements: Extraocular movements intact.  Conjunctiva/sclera: Conjunctivae normal.     Pupils: Pupils are equal, round, and reactive to light.  Cardiovascular:     Rate and Rhythm: Normal rate and regular rhythm.     Pulses: Normal pulses.     Heart sounds: Normal heart sounds.  Pulmonary:     Effort: Pulmonary effort is normal.     Breath sounds: Normal breath sounds.  Musculoskeletal:     Cervical back: Normal range of motion and neck supple.  Neurological:     General: No focal deficit present.     Mental Status: She is alert and oriented to person,  place, and time.  Psychiatric:        Mood and Affect: Mood normal.        Behavior: Behavior normal.   Results for orders placed or performed in visit on 01/15/22  POCT glycosylated hemoglobin (Hb A1C)  Result Value Ref Range   Hemoglobin A1C 6.1 (A) 4.0 - 5.6 %   HbA1c POC (<> result, manual entry)     HbA1c, POC (prediabetic range)     HbA1c, POC (controlled diabetic range)      ASSESSMENT AND PLAN: 1. Prediabetes - Hemoglobin A1c 6.1% and remaining consistent with prediabetes.  - Follow-up in 6 months or sooner if needed.  - POCT glycosylated hemoglobin (Hb A1C)    Patient was given the opportunity to ask questions.  Patient verbalized understanding of the plan and was able to repeat key elements of the plan. Patient was given clear instructions to go to Emergency Department or return to medical center if symptoms don't improve, worsen, or new problems develop.The patient verbalized understanding.   Orders Placed This Encounter  Procedures   POCT glycosylated hemoglobin (Hb A1C)     Return in about 6 months (around 07/18/2022) for Follow-Up or next available prediabetes.  Camillia Herter, NP

## 2022-01-08 ENCOUNTER — Other Ambulatory Visit: Payer: Self-pay | Admitting: Family

## 2022-01-08 DIAGNOSIS — I10 Essential (primary) hypertension: Secondary | ICD-10-CM

## 2022-01-09 NOTE — Telephone Encounter (Signed)
Requested Prescriptions  Pending Prescriptions Disp Refills  . valsartan (DIOVAN) 80 MG tablet [Pharmacy Med Name: VALSARTAN 80 MG TABLET] 90 tablet 1    Sig: TAKE 1 TABLET BY MOUTH EVERY DAY     Cardiovascular:  Angiotensin Receptor Blockers Failed - 01/08/2022  2:30 PM      Failed - Last BP in normal range    BP Readings from Last 1 Encounters:  11/26/21 (!) 168/99         Passed - Cr in normal range and within 180 days    Creat  Date Value Ref Range Status  08/09/2019 0.92 0.50 - 1.05 mg/dL Final    Comment:    For patients >44 years of age, the reference limit for Creatinine is approximately 13% higher for people identified as African-American. .    Creatinine, Ser  Date Value Ref Range Status  09/15/2021 0.98 0.57 - 1.00 mg/dL Final   Creatinine,U  Date Value Ref Range Status  08/11/2012 108.5 mg/dL Final         Passed - K in normal range and within 180 days    Potassium  Date Value Ref Range Status  09/15/2021 3.7 3.5 - 5.2 mmol/L Final         Passed - Patient is not pregnant      Passed - Valid encounter within last 6 months    Recent Outpatient Visits          3 months ago Essential (primary) hypertension   Primary Care at [facility], [place], NP   6 months ago Essential hypertension   Primary Care at [facility], Amy J, NP   10 months ago Essential hypertension   Primary Care at [facility], [place], NP   1 year ago Essential hypertension   Primary Care at [facility], [place], NP   1 year ago Encounter for annual physical exam   Primary Care at [facility], [place], MD      Future Appointments            In 6 days Camillia Herter, NP Primary Care at [facility]   In 2 months Hilty, Nadean Corwin, MD [facility], [facility]

## 2022-01-13 ENCOUNTER — Other Ambulatory Visit: Payer: Self-pay | Admitting: Family

## 2022-01-13 DIAGNOSIS — I10 Essential (primary) hypertension: Secondary | ICD-10-CM

## 2022-01-15 ENCOUNTER — Ambulatory Visit (INDEPENDENT_AMBULATORY_CARE_PROVIDER_SITE_OTHER): Payer: No Typology Code available for payment source | Admitting: Family

## 2022-01-15 ENCOUNTER — Encounter: Payer: Self-pay | Admitting: Family

## 2022-01-15 VITALS — BP 120/86 | HR 82 | Temp 98.3°F | Resp 18 | Ht 69.02 in | Wt 215.0 lb

## 2022-01-15 DIAGNOSIS — R7303 Prediabetes: Secondary | ICD-10-CM | POA: Diagnosis not present

## 2022-01-15 LAB — POCT GLYCOSYLATED HEMOGLOBIN (HGB A1C): Hemoglobin A1C: 6.1 % — AB (ref 4.0–5.6)

## 2022-01-15 NOTE — Progress Notes (Signed)
Pt presents for prediabetes follow-up -.A1c 6.2% 06/17/21 down to 6.1% today

## 2022-01-15 NOTE — Patient Instructions (Signed)

## 2022-03-07 ENCOUNTER — Other Ambulatory Visit: Payer: Self-pay | Admitting: Family

## 2022-03-09 MED ORDER — LEVOTHYROXINE SODIUM 125 MCG PO TABS: 125.0000 ug | ORAL_TABLET | Freq: Every day | ORAL | 0 refills | Status: DC

## 2022-03-16 ENCOUNTER — Other Ambulatory Visit: Payer: Self-pay | Admitting: Family

## 2022-03-16 DIAGNOSIS — I10 Essential (primary) hypertension: Secondary | ICD-10-CM

## 2022-03-16 MED ORDER — HYDROCHLOROTHIAZIDE 25 MG PO TABS: 25.0000 mg | ORAL_TABLET | Freq: Every day | ORAL | 0 refills | Status: DC

## 2022-04-01 LAB — LIPID PANEL
Chol/HDL Ratio: 3.9 ratio (ref 0.0–4.4)
Cholesterol, Total: 186 mg/dL (ref 100–199)
HDL: 48 mg/dL (ref >39)
LDL Chol Calc (NIH): 110 mg/dL — ABNORMAL HIGH (ref 0–99)
Triglycerides: 158 mg/dL — ABNORMAL HIGH (ref 0–149)
VLDL Cholesterol Cal: 28 mg/dL (ref 5–40)

## 2022-04-02 ENCOUNTER — Encounter: Payer: Self-pay | Admitting: Internal Medicine

## 2022-04-02 ENCOUNTER — Ambulatory Visit: Payer: No Typology Code available for payment source | Attending: Internal Medicine | Admitting: Internal Medicine

## 2022-04-02 VITALS — BP 144/92 | HR 89 | Ht 68.0 in | Wt 222.0 lb

## 2022-04-02 DIAGNOSIS — Z720 Tobacco use: Secondary | ICD-10-CM | POA: Diagnosis not present

## 2022-04-02 DIAGNOSIS — I10 Essential (primary) hypertension: Secondary | ICD-10-CM | POA: Diagnosis not present

## 2022-04-02 DIAGNOSIS — E785 Hyperlipidemia, unspecified: Secondary | ICD-10-CM

## 2022-04-02 NOTE — Patient Instructions (Signed)
Medication Instructions:  NO CHANGES  *If you need a refill on your cardiac medications before your next appointment, please call your pharmacy*   Follow-Up: At [facility], you and your health needs are our priority.  As part of our continuing mission to provide you with exceptional heart care, we have created designated Provider Care Teams.  These Care Teams include your primary Cardiologist (physician) and Advanced Practice Providers (APPs -  Physician Assistants and Nurse Practitioners) who all work together to provide you with the care you need, when you need it.  We recommend signing up for the patient portal called "MyChart".  Sign up information is provided on this After Visit Summary.  MyChart is used to connect with patients for Virtual Visits (Telemedicine).  Patients are able to view lab/test results, encounter notes, upcoming appointments, etc.  Non-urgent messages can be sent to your provider as well.   To learn more about what you can do with MyChart, go to [email or web address].    Your next appointment:    AS NEEDED with Dr. Debara Pickett   I

## 2022-04-02 NOTE — Progress Notes (Addendum)
LIPID CLINIC CONSULT NOTE  Chief Complaint:  Manage dyslipidemia  Primary Care Physician: Camillia Herter, NP  Primary Cardiologist:  None  HPI:  Adriana Young is a 61 y.o. female who is being seen today for the evaluation of dyslipidemia at the request of Camillia Herter, NP. This is a pleasant 61 year old female kindly referred for evaluation management of dyslipidemia.  She has a history of hypertension on 2 medications but otherwise few medical problems.  Recently she has been having some pulmonary issues including hypoxia and upper airway wheezing.  This is responded to antibiotics and steroids.  She was on steroids in April for an acute exacerbation.  At that time her lipids were markedly elevated with total cholesterol 298, triglycerides 130, HDL 57 LDL 217.  Prior to this however her cholesterol was quite low in March 2022 with total 182, triglycerides 136, HDL 52 and LDL 106.  She reported she was on Crestor at this time but then she had some significant cramping in her legs which caused her to discontinue the medication.  Subsequently she was referred for lipid management.  She reports she is currently on steroids and antibiotics again after recent ER visit.  04/02/2022  Adriana Young returns today for follow-up.  She has had an excellent response to atorvastatin 40 mg daily.  Total cholesterols come down from 298-186 with LDL has come down from 217-119.  She seems to be tolerating the medicine well, unlike rosuvastatin.   PMHx:  Past Medical History:  Diagnosis Date   Allergy    Arthritis    Phreesia 06/30/2020   GERD (gastroesophageal reflux disease)    Glaucoma    Headache(784.0)    Hyperlipidemia    Hypertension    Osteoarthritis    Thyroid disease    hypo   Type II or unspecified type diabetes mellitus without mention of complication, uncontrolled 09/19/2010    Past Surgical History:  Procedure Laterality Date   BREAST SURGERY     Lumpectomy- left breast, no lymph  node involvement    FAMHx:  Family History  Problem Relation Age of Onset   Heart disease Mother    Diabetes Mother    Hypertension Mother    Alcohol abuse Other    Arthritis Other    Hyperlipidemia Other    Hypertension Other    Stroke Other        Female 1st degree relative < 60   Diabetes Maternal Grandmother    Colon cancer Neg Hx    Esophageal cancer Neg Hx    Stomach cancer Neg Hx    Rectal cancer Neg Hx     SOCHx:   reports that she quit smoking about 21 years ago. Her smoking use included cigarettes. She has a 25.00 pack-year smoking history. She has been exposed to tobacco smoke. She has never used smokeless tobacco. She reports that she does not drink alcohol and does not use drugs.  ALLERGIES:  Allergies  Allergen Reactions   Bee Venom Anaphylaxis   Other Hives, Shortness Of Breath, Itching, Swelling and Rash    Bee stings    Crestor [Rosuvastatin]     Myalgias    Poison Ivy Extract Rash    ROS: Pertinent items noted in HPI and remainder of comprehensive ROS otherwise negative.  HOME MEDS: Current Outpatient Medications on File Prior to Visit  Medication Sig Dispense Refill   acetaminophen (TYLENOL) 650 MG CR tablet Take 1,300 mg by mouth 2 (two) times daily.  albuterol (VENTOLIN HFA) 108 (90 Base) MCG/ACT inhaler Inhale 1-2 puffs into the lungs every 4 (four) hours as needed for wheezing or shortness of breath. 1 each 0   atorvastatin (LIPITOR) 40 MG tablet Take 1 tablet (40 mg total) by mouth daily. 90 tablet 3   diazepam (VALIUM) 5 MG tablet 1 tablet by mouth prior to dental appointment, if surgery not prior to cleanings  0   hydrochlorothiazide (HYDRODIURIL) 25 MG tablet Take 1 tablet (25 mg total) by mouth daily. 120 tablet 0   levothyroxine (SYNTHROID) 125 MCG tablet Take 1 tablet (125 mcg total) by mouth daily. 90 tablet 0   Multiple Vitamins-Minerals ([employer] SPECTRAVITE PO) Take 1 tablet by mouth daily.     omeprazole (PRILOSEC) 40 MG capsule TAKE  1 CAPSULE BY MOUTH EVERY DAY (BE SURE TO TAKE 1/2 HOUR BEFORE MEAL) 90 capsule 0   Probiotic Product (PROBIOTIC DAILY PO) Take 1 capsule by mouth daily.     valsartan (DIOVAN) 80 MG tablet TAKE 1 TABLET BY MOUTH EVERY DAY 90 tablet 0   fluticasone (FLONASE) 50 MCG/ACT nasal spray Place 1 spray into both nostrils daily for 3 days. 16 g 0   No current facility-administered medications on file prior to visit.    LABS/IMAGING: No results found for this or any previous visit (from the past 48 hour(s)). No results found.  LIPID PANEL:    Component Value Date/Time   CHOL 186 03/31/2022 0818   TRIG 158 (H) 03/31/2022 0818   HDL 48 03/31/2022 0818   CHOLHDL 3.9 03/31/2022 0818   CHOLHDL 3.6 06/22/2019 0805   VLDL 22.4 10/27/2016 1515   LDLCALC 110 (H) 03/31/2022 0818   LDLCALC 121 (H) 06/22/2019 0805   LDLDIRECT 189.4 07/18/2009 1037    WEIGHTS: Wt Readings from Last 3 Encounters:  04/02/22 222 lb (100.7 kg)  01/15/22 215 lb (97.5 kg)  11/26/21 217 lb (98.4 kg)    VITALS: BP (!) 144/92   Pulse 89   Ht '5\' 8"'$  (1.727 m)   Wt 222 lb (100.7 kg)   LMP 10/25/2014   SpO2 97%   BMI 33.75 kg/m   EXAM: Deferred  EKG: N/A  ASSESSMENT: Mixed dyslipidemia Cramps with rosuvastatin Hypertension Tobacco abuse  PLAN: 1.   Adriana Young has had significant improvement in her dyslipidemia which is now on par with the lipid lowering she had with rosuvastatin but she is not having any cramps with this.  I would target her LDL to be below 100.  She is close to that.  She might benefit from some additional therapy such as ezetimibe but she is hesitant to do any of that right now.  She wants to follow-up with her PCP and therefore see me back as needed.  Adriana Casino, MD, [facility], [place] Director of the Advanced Lipid Disorders &  Cardiovascular Risk Reduction Clinic Diplomate of the American Board of Clinical Lipidology Attending Cardiologist  Direct  Dial: [phone]  Fax: [phone]  Website:  [email or web address] [email or web address] Hartley Wyke 04/02/2022, 4:12 PM

## 2022-04-10 ENCOUNTER — Other Ambulatory Visit: Payer: Self-pay | Admitting: Family

## 2022-04-10 DIAGNOSIS — I10 Essential (primary) hypertension: Secondary | ICD-10-CM

## 2022-04-22 ENCOUNTER — Other Ambulatory Visit: Payer: Self-pay

## 2022-04-22 DIAGNOSIS — Z2911 Encounter for prophylactic immunotherapy for respiratory syncytial virus (RSV): Secondary | ICD-10-CM

## 2022-04-30 ENCOUNTER — Other Ambulatory Visit: Payer: Self-pay

## 2022-06-06 ENCOUNTER — Other Ambulatory Visit: Payer: Self-pay | Admitting: Family

## 2022-06-12 ENCOUNTER — Other Ambulatory Visit: Payer: Self-pay | Admitting: Family

## 2022-06-12 DIAGNOSIS — I10 Essential (primary) hypertension: Secondary | ICD-10-CM

## 2022-06-12 NOTE — Telephone Encounter (Signed)
Requested medication (s) are due for refill today: yes  Requested medication (s) are on the active medication list: yes  Last refill:  03/16/22-07/14/22 120 0 refills  Future visit scheduled: yes in 1 week   Notes to clinic:  protocol failed. Last labs 09/15/21. Do you want to refill Rx?     Requested Prescriptions  Pending Prescriptions Disp Refills   hydrochlorothiazide (HYDRODIURIL) 25 MG tablet [Pharmacy Med Name: HYDROCHLOROTHIAZIDE 25 MG TAB] 90 tablet 1    Sig: Take 1 tablet (25 mg total) by mouth daily.     Cardiovascular: Diuretics - Thiazide Failed - 06/12/2022  1:31 AM      Failed - Cr in normal range and within 180 days    Creat  Date Value Ref Range Status  08/09/2019 0.92 0.50 - 1.05 mg/dL Final    Comment:    For patients >55 years of age, the reference limit for Creatinine is approximately 13% higher for people identified as African-American. .    Creatinine, Ser  Date Value Ref Range Status  09/15/2021 0.98 0.57 - 1.00 mg/dL Final   Creatinine,U  Date Value Ref Range Status  08/11/2012 108.5 mg/dL Final         Failed - K in normal range and within 180 days    Potassium  Date Value Ref Range Status  09/15/2021 3.7 3.5 - 5.2 mmol/L Final         Failed - Na in normal range and within 180 days    Sodium  Date Value Ref Range Status  09/15/2021 143 134 - 144 mmol/L Final         Failed - Last BP in normal range    BP Readings from Last 1 Encounters:  04/02/22 (!) 144/92         Passed - Valid encounter within last 6 months    Recent Outpatient Visits           4 months ago Prediabetes   [place] [facility] at [facility], [place], NP   9 months ago Essential (primary) hypertension   [place] [facility] at [facility], [place], NP   12 months ago Essential hypertension   [place] [facility] at [facility], [place], NP   1 year ago Essential hypertension   [place] [facility] at [facility], [place], NP   1 year ago Essential hypertension   [place] [facility] at [facility], [place], NP       Future Appointments             In 1 month Camillia Herter, NP [place] [facility]

## 2022-06-17 DIAGNOSIS — M9905 Segmental and somatic dysfunction of pelvic region: Secondary | ICD-10-CM | POA: Diagnosis not present

## 2022-06-17 DIAGNOSIS — M9902 Segmental and somatic dysfunction of thoracic region: Secondary | ICD-10-CM | POA: Diagnosis not present

## 2022-06-17 DIAGNOSIS — M9903 Segmental and somatic dysfunction of lumbar region: Secondary | ICD-10-CM | POA: Diagnosis not present

## 2022-06-17 DIAGNOSIS — M9904 Segmental and somatic dysfunction of sacral region: Secondary | ICD-10-CM | POA: Diagnosis not present

## 2022-07-06 ENCOUNTER — Other Ambulatory Visit: Payer: Self-pay | Admitting: Family

## 2022-07-06 DIAGNOSIS — I10 Essential (primary) hypertension: Secondary | ICD-10-CM

## 2022-07-13 NOTE — Progress Notes (Unsigned)
Patient ID: Adriana Young, female    DOB: [birth year]  MRN: [id number]  CC: Prediabetes Follow-Up  Subjective: Adriana Young is a 62 y.o. female who presents for prediabetes follow-up.  Her concerns today include:  She is monitoring what she eats and drinking water. States she is feeling fatigued. She is getting adequate rest and taking an over-the-counter daily multivitamin. In past history of anemia related to menses. No further issues/concerns.   Patient Active Problem List   Diagnosis Date Noted   Type 2 diabetes mellitus ([place]) 07/16/2022   Bacterial vaginitis 02/24/2021   Astigmatism of left eye 12/27/2015   Corneal scar, left eye 12/27/2015   Degenerative joint disease (DJD) of hip 10/14/2015   Fatty liver 09/19/2010   Hypothyroidism 06/18/2008   Hyperlipidemia with target LDL less than 100 06/04/2008   Essential hypertension 06/04/2008   GERD 06/04/2008   Generalized OA 06/04/2008     Current Outpatient Medications on File Prior to Visit  Medication Sig Dispense Refill   acetaminophen (TYLENOL) 650 MG CR tablet Take 1,300 mg by mouth 2 (two) times daily.      albuterol (VENTOLIN HFA) 108 (90 Base) MCG/ACT inhaler Inhale 1-2 puffs into the lungs every 4 (four) hours as needed for wheezing or shortness of breath. 1 each 0   atorvastatin (LIPITOR) 40 MG tablet Take 1 tablet (40 mg total) by mouth daily. 90 tablet 3   hydrochlorothiazide (HYDRODIURIL) 25 MG tablet Take 1 tablet (25 mg total) by mouth daily. 90 tablet 0   levothyroxine (SYNTHROID) 125 MCG tablet TAKE 1 TABLET BY MOUTH EVERY DAY 90 tablet 0   Multiple Vitamins-Minerals (CVS SPECTRAVITE PO) Take 1 tablet by mouth daily.     omeprazole (PRILOSEC) 40 MG capsule TAKE 1 CAPSULE BY MOUTH EVERY DAY (BE SURE TO TAKE 1/2 HOUR BEFORE MEAL) 90 capsule 0   Probiotic Product (PROBIOTIC DAILY PO) Take 1 capsule by mouth daily.     valsartan (DIOVAN) 80 MG tablet TAKE 1 TABLET BY MOUTH EVERY DAY 90 tablet 0   No current  facility-administered medications on file prior to visit.    Allergies  Allergen Reactions   Bee Venom Anaphylaxis   Other Hives, Shortness Of Breath, Itching, Swelling and Rash    Bee stings    Crestor [Rosuvastatin]     Myalgias    Poison Ivy Extract Rash    Social History   Socioeconomic History   Marital status: Married    Spouse name: Not on file   Number of children: Not on file   Years of education: Not on file   Highest education level: Not on file  Occupational History   Not on file  Tobacco Use   Smoking status: Former    Packs/day: 1.00    Years: 25.00    Total pack years: 25.00    Types: Cigarettes    Quit date: 05/25/2000    Years since quitting: 22.1    Passive exposure: Past   Smokeless tobacco: Never  Vaping Use   Vaping Use: Some days   Start date: 11/26/2018  Substance and Sexual Activity   Alcohol use: No   Drug use: No   Sexual activity: Yes  Other Topics Concern   Not on file  Social History Narrative   Social History      Diet? Just watching- cutting out bread, drinking water      Do you drink/eat things with caffeine? yes      Marital status?  married                            What year were you married? 1980      Do you live in a house, apartment, assisted living, condo, trailer, etc.? house      Is it one or more stories? one      How many persons live in your home? 3      Do you have any pets in your home? (please list) yes 2 dogs, 3 cats, 3 fish      Highest level of education completed? 2 year college      Current or past profession:      Do you exercise?         yes                             Type & how often? gym      Advanced Directives      Do you have a living will? yes      Do you have a DNR form?                                  If not, do you want to discuss one? no      Do you have signed POA/HPOA for forms? yes      Functional Status      Do you have difficulty bathing or dressing yourself? no       Do you have difficulty preparing food or eating? no      Do you have difficulty managing your medications? no      Do you have difficulty managing your finances? no      Do you have difficulty affording your medications? no   Social Determinants of Health   Financial Resource Strain: Not on file  Food Insecurity: Not on file  Transportation Needs: Not on file  Physical Activity: Not on file  Stress: Not on file  Social Connections: Not on file  Intimate Partner Violence: Not on file    Family History  Problem Relation Age of Onset   Heart disease Mother    Diabetes Mother    Hypertension Mother    Alcohol abuse Other    Arthritis Other    Hyperlipidemia Other    Hypertension Other    Stroke Other        Female 1st degree relative < 60   Diabetes Maternal Grandmother    Colon cancer Neg Hx    Esophageal cancer Neg Hx    Stomach cancer Neg Hx    Rectal cancer Neg Hx     Past Surgical History:  Procedure Laterality Date   BREAST SURGERY     Lumpectomy- left breast, no lymph node involvement    ROS: Review of Systems Negative except as stated above  PHYSICAL EXAM: BP 113/79 (BP Location: Left Arm, Patient Position: Sitting, Cuff Size: Large)   Pulse 72   Temp 98.3 F (36.8 C)   Resp 16   Ht 5' 9.02" (1.753 m)   Wt 223 lb (101.2 kg)   LMP 10/25/2014   SpO2 96%   BMI 32.92 kg/m   Physical Exam HENT:     Head: Normocephalic and atraumatic.  Eyes:     Extraocular Movements: Extraocular movements intact.  Conjunctiva/sclera: Conjunctivae normal.     Pupils: Pupils are equal, round, and reactive to light.  Cardiovascular:     Rate and Rhythm: Normal rate and regular rhythm.     Pulses: Normal pulses.     Heart sounds: Normal heart sounds.  Pulmonary:     Effort: Pulmonary effort is normal.     Breath sounds: Normal breath sounds.  Musculoskeletal:     Cervical back: Normal range of motion and neck supple.  Neurological:     General: No focal  deficit present.     Mental Status: She is alert and oriented to person, place, and time.  Psychiatric:        Mood and Affect: Mood normal.        Behavior: Behavior normal.    Results for orders placed or performed in visit on 07/16/22  POCT glycosylated hemoglobin (Hb A1C)  Result Value Ref Range   Hemoglobin A1C     HbA1c POC (<> result, manual entry)     HbA1c, POC (prediabetic range)     HbA1c, POC (controlled diabetic range) 6.5 0.0 - 7.0 %    ASSESSMENT AND PLAN: 1. Type 2 diabetes mellitus with hyperglycemia, without long-term current use of insulin ([place]) - New onset.  - Hemoglobin A1c 6.5%, goal 7%. This is increased from previous 6.1%. - Begin Metformin XR as prescribed. Counseled on medication adherence/adverse effects. - Discussed the importance of healthy eating habits, low-carbohydrate diet, low-sugar diet, regular aerobic exercise (at least 150 minutes a week as tolerated) and medication compliance to achieve or maintain control of diabetes. - Follow-up with primary provider in 4 weeks or sooner if needed.  - POCT glycosylated hemoglobin (Hb A1C) - metFORMIN (GLUCOPHAGE-XR) 500 MG 24 hr tablet; Take 1 tablet (500 mg total) by mouth daily with breakfast.  Dispense: 30 tablet; Refill: 0  2. Hypothyroidism, unspecified type - Continue Levothyroxine as prescribed. No refills needed as of present. - Routine screening.  - Follow-up with primary provider as scheduled.  - TSH  3. Fatigue, unspecified type - Routine screening.  - CBC - Vitamin D, 25-hydroxy - CMP14+EGFR   Patient was given the opportunity to ask questions.  Patient verbalized understanding of the plan and was able to repeat key elements of the plan. Patient was given clear instructions to go to Emergency Department or return to medical center if symptoms don't improve, worsen, or new problems develop.The patient verbalized understanding.   Orders Placed This Encounter  Procedures   TSH   CBC    Vitamin D, 25-hydroxy   CMP14+EGFR   POCT glycosylated hemoglobin (Hb A1C)   Follow-up with primary provider as scheduled.   Camillia Herter, NP

## 2022-07-14 DIAGNOSIS — M9903 Segmental and somatic dysfunction of lumbar region: Secondary | ICD-10-CM | POA: Diagnosis not present

## 2022-07-14 DIAGNOSIS — M9904 Segmental and somatic dysfunction of sacral region: Secondary | ICD-10-CM | POA: Diagnosis not present

## 2022-07-14 DIAGNOSIS — M9901 Segmental and somatic dysfunction of cervical region: Secondary | ICD-10-CM | POA: Diagnosis not present

## 2022-07-14 DIAGNOSIS — M9905 Segmental and somatic dysfunction of pelvic region: Secondary | ICD-10-CM | POA: Diagnosis not present

## 2022-07-16 ENCOUNTER — Encounter: Payer: Self-pay | Admitting: Family

## 2022-07-16 ENCOUNTER — Ambulatory Visit: Payer: 59 | Admitting: Family

## 2022-07-16 VITALS — BP 113/79 | HR 72 | Temp 98.3°F | Resp 16 | Ht 69.02 in | Wt 223.0 lb

## 2022-07-16 DIAGNOSIS — E039 Hypothyroidism, unspecified: Secondary | ICD-10-CM

## 2022-07-16 DIAGNOSIS — R5383 Other fatigue: Secondary | ICD-10-CM | POA: Diagnosis not present

## 2022-07-16 DIAGNOSIS — E119 Type 2 diabetes mellitus without complications: Secondary | ICD-10-CM | POA: Insufficient documentation

## 2022-07-16 DIAGNOSIS — E1165 Type 2 diabetes mellitus with hyperglycemia: Secondary | ICD-10-CM | POA: Diagnosis not present

## 2022-07-16 DIAGNOSIS — R7303 Prediabetes: Secondary | ICD-10-CM

## 2022-07-16 LAB — POCT GLYCOSYLATED HEMOGLOBIN (HGB A1C): HbA1c, POC (controlled diabetic range): 6.5 % (ref 0.0–7.0)

## 2022-07-16 MED ORDER — METFORMIN HCL ER 500 MG PO TB24: 500.0000 mg | ORAL_TABLET | Freq: Every day | ORAL | 0 refills | Status: DC

## 2022-07-16 NOTE — Progress Notes (Signed)
.  Pt presents for chronic care management   -pt states experiencing extreme fatigue

## 2022-07-21 ENCOUNTER — Other Ambulatory Visit: Payer: Self-pay | Admitting: Family

## 2022-07-21 DIAGNOSIS — E039 Hypothyroidism, unspecified: Secondary | ICD-10-CM

## 2022-07-21 LAB — CBC
Hematocrit: 47 % — ABNORMAL HIGH (ref 34.0–46.6)
Hemoglobin: 15.7 g/dL (ref 11.1–15.9)
MCH: 30.5 pg (ref 26.6–33.0)
MCHC: 33.4 g/dL (ref 31.5–35.7)
MCV: 91 fL (ref 79–97)
Platelets: 197 10*3/uL (ref 150–450)
RBC: 5.15 x10E6/uL (ref 3.77–5.28)
RDW: 12.7 % (ref 11.7–15.4)
WBC: 5.2 10*3/uL (ref 3.4–10.8)

## 2022-07-21 LAB — CMP14+EGFR
ALT: 22 IU/L (ref 0–32)
AST: 19 IU/L (ref 0–40)
Albumin/Globulin Ratio: 1.8 (ref 1.2–2.2)
Albumin: 4.3 g/dL (ref 3.9–4.9)
Alkaline Phosphatase: 94 IU/L (ref 44–121)
BUN/Creatinine Ratio: 20 (ref 12–28)
BUN: 17 mg/dL (ref 8–27)
Bilirubin Total: 0.4 mg/dL (ref 0.0–1.2)
CO2: 19 mmol/L — ABNORMAL LOW (ref 20–29)
Calcium: 9.6 mg/dL (ref 8.7–10.3)
Chloride: 100 mmol/L (ref 96–106)
Creatinine, Ser: 0.85 mg/dL (ref 0.57–1.00)
Globulin, Total: 2.4 g/dL (ref 1.5–4.5)
Glucose: 130 mg/dL — ABNORMAL HIGH (ref 70–99)
Potassium: 3.6 mmol/L (ref 3.5–5.2)
Sodium: 140 mmol/L (ref 134–144)
Total Protein: 6.7 g/dL (ref 6.0–8.5)
eGFR: 77 mL/min/{1.73_m2} (ref >59)

## 2022-07-21 LAB — VITAMIN D 25 HYDROXY (VIT D DEFICIENCY, FRACTURES): Vit D, 25-Hydroxy: 36.8 ng/mL (ref 30.0–100.0)

## 2022-07-21 LAB — TSH: TSH: 0.37 u[IU]/mL — ABNORMAL LOW (ref 0.450–4.500)

## 2022-07-31 ENCOUNTER — Other Ambulatory Visit: Payer: Self-pay | Admitting: Family

## 2022-07-31 DIAGNOSIS — I10 Essential (primary) hypertension: Secondary | ICD-10-CM

## 2022-08-05 ENCOUNTER — Other Ambulatory Visit: Payer: Self-pay | Admitting: Family

## 2022-08-05 DIAGNOSIS — I10 Essential (primary) hypertension: Secondary | ICD-10-CM

## 2022-08-05 MED ORDER — HYDROCHLOROTHIAZIDE 25 MG PO TABS: 25.0000 mg | ORAL_TABLET | Freq: Every day | ORAL | 0 refills | Status: DC

## 2022-08-06 ENCOUNTER — Other Ambulatory Visit: Payer: Self-pay | Admitting: Family

## 2022-08-06 DIAGNOSIS — E039 Hypothyroidism, unspecified: Secondary | ICD-10-CM

## 2022-08-11 NOTE — Progress Notes (Signed)
Patient ID: Adriana Young, female    DOB: 23-Jul-1960  MRN: [id number]  CC: Diabetes Follow-Up   Subjective: Adriana Young is a 62 y.o. female who presents for diabetes follow-up.   Her concerns today include:  Doing well on Metformin XR, no issues/concerns. Initially she was having flatulence for 1 week but since then has improved since she began taking medication a half hour before bedtime. No further issues/concerns for discussion today.   Patient Active Problem List   Diagnosis Date Noted   Type 2 diabetes mellitus ([place]) 07/16/2022   Bacterial vaginitis 02/24/2021   Astigmatism of left eye 12/27/2015   Corneal scar, left eye 12/27/2015   Degenerative joint disease (DJD) of hip 10/14/2015   Fatty liver 09/19/2010   Hypothyroidism 06/18/2008   Hyperlipidemia with target LDL less than 100 06/04/2008   Essential hypertension 06/04/2008   GERD 06/04/2008   Generalized OA 06/04/2008     Current Outpatient Medications on File Prior to Visit  Medication Sig Dispense Refill   atorvastatin (LIPITOR) 40 MG tablet Take 1 tablet (40 mg total) by mouth daily. 90 tablet 3   hydrochlorothiazide (HYDRODIURIL) 25 MG tablet Take 1 tablet (25 mg total) by mouth daily. 90 tablet 0   levothyroxine (SYNTHROID) 125 MCG tablet TAKE 1 TABLET BY MOUTH EVERY DAY 90 tablet 0   Multiple Vitamins-Minerals ([employer] SPECTRAVITE PO) Take 1 tablet by mouth daily.     Probiotic Product (PROBIOTIC DAILY PO) Take 1 capsule by mouth daily.     valsartan (DIOVAN) 80 MG tablet TAKE 1 TABLET BY MOUTH EVERY DAY 90 tablet 0   No current facility-administered medications on file prior to visit.    Allergies  Allergen Reactions   Bee Venom Anaphylaxis   Other Hives, Shortness Of Breath, Itching, Swelling and Rash    Bee stings    Crestor [Rosuvastatin]     Myalgias    Poison Ivy Extract Rash    Social History   Socioeconomic History   Marital status: Married    Spouse name: Not on file   Number of children: Not  on file   Years of education: Not on file   Highest education level: Not on file  Occupational History   Not on file  Tobacco Use   Smoking status: Former    Packs/day: 1.00    Years: 25.00    Additional pack years: 0.00    Total pack years: 25.00    Types: Cigarettes    Quit date: 05/25/2000    Years since quitting: 22.2    Passive exposure: Past   Smokeless tobacco: Never  Vaping Use   Vaping Use: Some days   Start date: 11/26/2018  Substance and Sexual Activity   Alcohol use: No   Drug use: No   Sexual activity: Yes  Other Topics Concern   Not on file  Social History Narrative   Social History      Diet? Just watching- cutting out bread, drinking water      Do you drink/eat things with caffeine? yes      Marital status?        married                            What year were you married? 1980      Do you live in a house, apartment, assisted living, condo, trailer, etc.? house      Is it one or more  stories? one      How many persons live in your home? 3      Do you have any pets in your home? (please list) yes 2 dogs, 3 cats, 3 fish      Highest level of education completed? 2 year college      Current or past profession:      Do you exercise?         yes                             Type & how often? gym      Advanced Directives      Do you have a living will? yes      Do you have a DNR form?                                  If not, do you want to discuss one? no      Do you have signed POA/HPOA for forms? yes      Functional Status      Do you have difficulty bathing or dressing yourself? no      Do you have difficulty preparing food or eating? no      Do you have difficulty managing your medications? no      Do you have difficulty managing your finances? no      Do you have difficulty affording your medications? no   Social Determinants of Health   Financial Resource Strain: Not on file  Food Insecurity: Not on file  Transportation Needs: Not  on file  Physical Activity: Not on file  Stress: Not on file  Social Connections: Not on file  Intimate Partner Violence: Not on file    Family History  Problem Relation Age of Onset   Heart disease Mother    Diabetes Mother    Hypertension Mother    Alcohol abuse Other    Arthritis Other    Hyperlipidemia Other    Hypertension Other    Stroke Other        Female 1st degree relative < 60   Diabetes Maternal Grandmother    Colon cancer Neg Hx    Esophageal cancer Neg Hx    Stomach cancer Neg Hx    Rectal cancer Neg Hx     Past Surgical History:  Procedure Laterality Date   BREAST SURGERY     Lumpectomy- left breast, no lymph node involvement    ROS: Review of Systems Negative except as stated above  PHYSICAL EXAM: BP 118/78 (BP Location: Left Arm, Patient Position: Sitting, Cuff Size: Large)   Pulse 72   Temp 98.3 F (36.8 C)   Resp 16   Ht 5' 9.02" (1.753 m)   Wt 224 lb (101.6 kg)   LMP 10/25/2014   SpO2 98%   BMI 33.06 kg/m   Physical Exam HENT:     Head: Normocephalic and atraumatic.  Eyes:     Extraocular Movements: Extraocular movements intact.     Conjunctiva/sclera: Conjunctivae normal.     Pupils: Pupils are equal, round, and reactive to light.  Cardiovascular:     Rate and Rhythm: Normal rate and regular rhythm.     Pulses: Normal pulses.     Heart sounds: Normal heart sounds.  Musculoskeletal:     Cervical back: Normal range of motion and neck  supple.  Neurological:     General: No focal deficit present.     Mental Status: She is alert and oriented to person, place, and time.  Psychiatric:        Mood and Affect: Mood normal.        Behavior: Behavior normal.     ASSESSMENT AND PLAN: 1. Type 2 diabetes mellitus with hyperglycemia, without long-term current use of insulin (HCC) - Continue Metformin XR as prescribed.  - Discussed the importance of healthy eating habits, low-carbohydrate diet, low-sugar diet, regular aerobic exercise (at  least 150 minutes a week as tolerated) and medication compliance to achieve or maintain control of diabetes. - Follow-up with primary provider in 8 weeks or sooner if needed.  - metFORMIN (GLUCOPHAGE-XR) 500 MG 24 hr tablet; Take 1 tablet (500 mg total) by mouth daily with breakfast.  Dispense: 60 tablet; Refill: 0   Patient was given the opportunity to ask questions.  Patient verbalized understanding of the plan and was able to repeat key elements of the plan. Patient was given clear instructions to go to Emergency Department or return to medical center if symptoms don't improve, worsen, or new problems develop.The patient verbalized understanding.   Requested Prescriptions   Signed Prescriptions Disp Refills   metFORMIN (GLUCOPHAGE-XR) 500 MG 24 hr tablet 60 tablet 0    Sig: Take 1 tablet (500 mg total) by mouth daily with breakfast.    Return in about 8 weeks (around 10/08/2022) for Follow-Up or next available chronic care mgmt .  Camillia Herter, NP

## 2022-08-12 DIAGNOSIS — M9901 Segmental and somatic dysfunction of cervical region: Secondary | ICD-10-CM | POA: Diagnosis not present

## 2022-08-12 DIAGNOSIS — M9904 Segmental and somatic dysfunction of sacral region: Secondary | ICD-10-CM | POA: Diagnosis not present

## 2022-08-12 DIAGNOSIS — M9905 Segmental and somatic dysfunction of pelvic region: Secondary | ICD-10-CM | POA: Diagnosis not present

## 2022-08-12 DIAGNOSIS — M9903 Segmental and somatic dysfunction of lumbar region: Secondary | ICD-10-CM | POA: Diagnosis not present

## 2022-08-13 ENCOUNTER — Ambulatory Visit (INDEPENDENT_AMBULATORY_CARE_PROVIDER_SITE_OTHER): Payer: 59 | Admitting: Family

## 2022-08-13 ENCOUNTER — Encounter: Payer: Self-pay | Admitting: Family

## 2022-08-13 VITALS — BP 118/78 | HR 72 | Temp 98.3°F | Resp 16 | Ht 69.02 in | Wt 224.0 lb

## 2022-08-13 DIAGNOSIS — E1165 Type 2 diabetes mellitus with hyperglycemia: Secondary | ICD-10-CM | POA: Diagnosis not present

## 2022-08-13 MED ORDER — METFORMIN HCL ER 500 MG PO TB24: 500.0000 mg | ORAL_TABLET | Freq: Every day | ORAL | 0 refills | Status: DC

## 2022-08-13 NOTE — Progress Notes (Signed)
Pt presents for follow-up for diabetes management  

## 2022-08-14 ENCOUNTER — Other Ambulatory Visit: Payer: Self-pay | Admitting: Family

## 2022-08-14 ENCOUNTER — Telehealth: Payer: Self-pay

## 2022-08-14 DIAGNOSIS — E039 Hypothyroidism, unspecified: Secondary | ICD-10-CM

## 2022-08-14 NOTE — Telephone Encounter (Signed)
Copied from [place] [phone]. Topic: General - Other >> Aug 14, 2022 11:02 AM Barbie Haggis wrote: Reason for CRM: [id number] - AMB REFERRAL TO ENDOCRINOLOGY fax# [phone]   pt is asking for office to resend referral,  it was initially sent 07/17/2022 but  Endo stated their system was down and lost information. Can you please resend.  Please resend referral

## 2022-08-14 NOTE — Telephone Encounter (Signed)
Complete

## 2022-08-14 NOTE — Telephone Encounter (Deleted)
Copied from [place] [phone]. Topic: General - Other >> Aug 14, 2022 11:02 AM Barbie Haggis wrote: Reason for CRM: [id number] - AMB REFERRAL TO ENDOCRINOLOGY fax# [phone]   pt is asking for office to resend referral,  it was initially sent 07/17/2022 but  Endo stated their system was down and lost information. Can you please resend.

## 2022-08-25 ENCOUNTER — Telehealth: Payer: Self-pay

## 2022-08-25 NOTE — Telephone Encounter (Signed)
Copied from [place] [phone]. Topic: General - Other >> Aug 25, 2022 11:03 AM Cyndi Bender wrote: Reason for CRM: Pt requests call back with A1C results. Cb# [phone]

## 2022-08-25 NOTE — Telephone Encounter (Signed)
Copied from [place] [phone]. Topic: Referral - Status >> Aug 25, 2022 11:00 AM Cyndi Bender wrote: Reason for CRM: Pt asked that the referral for Endocrinology be re-faxed as she was told that the request still has not been received.  Dorma Russell, can you assist?

## 2022-08-25 NOTE — Telephone Encounter (Unsigned)
Copied from [place] [phone]. Topic: Referral - Status >> Aug 25, 2022 11:00 AM Cyndi Bender wrote: Reason for CRM: Pt asked that the referral for Endocrinology be re-faxed as she was told that the request still has not been received.

## 2022-08-28 NOTE — Progress Notes (Unsigned)
Patient ID: Adriana Young, female    DOB: [birth year]  MRN: [id number]  CC: Diabetes Follow-Up  Subjective: Adriana Young is a 62 y.o. female who presents for diabetes follow-up.   Her concerns today include:  Doing well on diabetes medication, no issues/concerns. She is exercising routinely. She would like to have her hemoglobin A1c checked on today. No further issues/concerns for discussion today.   Patient Active Problem List   Diagnosis Date Noted   Type 2 diabetes mellitus 07/16/2022   Bacterial vaginitis 02/24/2021   Astigmatism of left eye 12/27/2015   Corneal scar, left eye 12/27/2015   Degenerative joint disease (DJD) of hip 10/14/2015   Fatty liver 09/19/2010   Hypothyroidism 06/18/2008   Hyperlipidemia with target LDL less than 100 06/04/2008   Essential hypertension 06/04/2008   GERD 06/04/2008   Generalized OA 06/04/2008     Current Outpatient Medications on File Prior to Visit  Medication Sig Dispense Refill   atorvastatin (LIPITOR) 40 MG tablet Take 1 tablet (40 mg total) by mouth daily. 90 tablet 3   hydrochlorothiazide (HYDRODIURIL) 25 MG tablet Take 1 tablet (25 mg total) by mouth daily. 90 tablet 0   levothyroxine (SYNTHROID) 125 MCG tablet TAKE 1 TABLET BY MOUTH EVERY DAY 90 tablet 0   metFORMIN (GLUCOPHAGE-XR) 500 MG 24 hr tablet Take 1 tablet (500 mg total) by mouth daily with breakfast. 60 tablet 0   Multiple Vitamins-Minerals ([employer] SPECTRAVITE PO) Take 1 tablet by mouth daily.     Probiotic Product (PROBIOTIC DAILY PO) Take 1 capsule by mouth daily.     valsartan (DIOVAN) 80 MG tablet TAKE 1 TABLET BY MOUTH EVERY DAY 90 tablet 0   No current facility-administered medications on file prior to visit.    Allergies  Allergen Reactions   Bee Venom Anaphylaxis   Other Hives, Shortness Of Breath, Itching, Swelling and Rash    Bee stings    Crestor [Rosuvastatin]     Myalgias    Poison Ivy Extract Rash    Social History   Socioeconomic History   Marital  status: Married    Spouse name: Not on file   Number of children: Not on file   Years of education: Not on file   Highest education level: Not on file  Occupational History   Not on file  Tobacco Use   Smoking status: Former    Packs/day: 1.00    Years: 25.00    Additional pack years: 0.00    Total pack years: 25.00    Types: Cigarettes    Quit date: 05/25/2000    Years since quitting: 22.2    Passive exposure: Past   Smokeless tobacco: Never  Vaping Use   Vaping Use: Some days   Start date: 11/26/2018  Substance and Sexual Activity   Alcohol use: No   Drug use: No   Sexual activity: Yes  Other Topics Concern   Not on file  Social History Narrative   Social History      Diet? Just watching- cutting out bread, drinking water      Do you drink/eat things with caffeine? yes      Marital status?        married                            What year were you married? 1980      Do you live in a house, apartment, assisted living, condo,  trailer, etc.? house      Is it one or more stories? one      How many persons live in your home? 3      Do you have any pets in your home? (please list) yes 2 dogs, 3 cats, 3 fish      Highest level of education completed? 2 year college      Current or past profession:      Do you exercise?         yes                             Type & how often? gym      Advanced Directives      Do you have a living will? yes      Do you have a DNR form?                                  If not, do you want to discuss one? no      Do you have signed POA/HPOA for forms? yes      Functional Status      Do you have difficulty bathing or dressing yourself? no      Do you have difficulty preparing food or eating? no      Do you have difficulty managing your medications? no      Do you have difficulty managing your finances? no      Do you have difficulty affording your medications? no   Social Determinants of Health   Financial Resource Strain:  Not on file  Food Insecurity: Not on file  Transportation Needs: Not on file  Physical Activity: Not on file  Stress: Not on file  Social Connections: Not on file  Intimate Partner Violence: Not on file    Family History  Problem Relation Age of Onset   Heart disease Mother    Diabetes Mother    Hypertension Mother    Alcohol abuse Other    Arthritis Other    Hyperlipidemia Other    Hypertension Other    Stroke Other        Female 1st degree relative < 60   Diabetes Maternal Grandmother    Colon cancer Neg Hx    Esophageal cancer Neg Hx    Stomach cancer Neg Hx    Rectal cancer Neg Hx     Past Surgical History:  Procedure Laterality Date   BREAST SURGERY     Lumpectomy- left breast, no lymph node involvement    ROS: Review of Systems Negative except as stated above  PHYSICAL EXAM: BP 119/78 (BP Location: Left Arm, Patient Position: Sitting, Cuff Size: Large)   Pulse 67   Temp 98.3 F (36.8 C)   Resp 16   Ht 5' 9.02" (1.753 m)   Wt 227 lb (103 kg)   LMP 10/25/2014   SpO2 98%   BMI 33.51 kg/m    Physical Exam HENT:     Head: Normocephalic and atraumatic.  Eyes:     Extraocular Movements: Extraocular movements intact.     Conjunctiva/sclera: Conjunctivae normal.     Pupils: Pupils are equal, round, and reactive to light.  Cardiovascular:     Rate and Rhythm: Normal rate and regular rhythm.     Pulses: Normal pulses.     Heart sounds: Normal heart sounds.  Pulmonary:     Effort: Pulmonary effort is normal.     Breath sounds: Normal breath sounds.  Musculoskeletal:     Cervical back: Normal range of motion and neck supple.  Neurological:     General: No focal deficit present.     Mental Status: She is alert and oriented to person, place, and time.  Psychiatric:        Mood and Affect: Mood normal.        Behavior: Behavior normal.    Results for orders placed or performed in visit on 09/03/22  POCT glycosylated hemoglobin (Hb A1C)  Result Value  Ref Range   Hemoglobin A1C     HbA1c POC (<> result, manual entry)     HbA1c, POC (prediabetic range) 6.3 5.7 - 6.4 %   HbA1c, POC (controlled diabetic range)      ASSESSMENT AND PLAN: 1. Type 2 diabetes mellitus with hyperglycemia, without long-term current use of insulin - Hemoglobin A1c at goal at 6.3%, goal 7%. This is improved compared to previous 6.5%.  - Continue Metformin XR as prescribed. No refills needed as of present.  - Discussed the importance of healthy eating habits, low-carbohydrate diet, low-sugar diet, regular aerobic exercise (at least 150 minutes a week as tolerated) and medication compliance to achieve or maintain control of diabetes. - Follow-up with primary provider in 3 months or sooner if needed.  - POCT glycosylated hemoglobin (Hb A1C)   Patient was given the opportunity to ask questions.  Patient verbalized understanding of the plan and was able to repeat key elements of the plan. Patient was given clear instructions to go to Emergency Department or return to medical center if symptoms don't improve, worsen, or new problems develop.The patient verbalized understanding.   Orders Placed This Encounter  Procedures   POCT glycosylated hemoglobin (Hb A1C)    Return in about 3 months (around 12/03/2022) for Follow-Up or next available chronic care mgmt .  Rema FendtAmy J Loana Salvaggio, NP

## 2022-09-03 ENCOUNTER — Ambulatory Visit: Payer: 59 | Admitting: Family

## 2022-09-03 VITALS — BP 119/78 | HR 67 | Temp 98.3°F | Resp 16 | Ht 69.02 in | Wt 227.0 lb

## 2022-09-03 DIAGNOSIS — E1165 Type 2 diabetes mellitus with hyperglycemia: Secondary | ICD-10-CM | POA: Diagnosis not present

## 2022-09-03 LAB — POCT GLYCOSYLATED HEMOGLOBIN (HGB A1C): HbA1c, POC (prediabetic range): 6.3 % (ref 5.7–6.4)

## 2022-09-03 NOTE — Progress Notes (Signed)
.  Pt presents for follow-up for diabetes management   -pt request check of A1c to see how she is managing it with her medications

## 2022-09-09 DIAGNOSIS — R5382 Chronic fatigue, unspecified: Secondary | ICD-10-CM | POA: Diagnosis not present

## 2022-09-09 DIAGNOSIS — E039 Hypothyroidism, unspecified: Secondary | ICD-10-CM | POA: Diagnosis not present

## 2022-09-10 ENCOUNTER — Other Ambulatory Visit: Payer: Self-pay | Admitting: Family

## 2022-09-10 DIAGNOSIS — I10 Essential (primary) hypertension: Secondary | ICD-10-CM

## 2022-09-10 DIAGNOSIS — R5382 Chronic fatigue, unspecified: Secondary | ICD-10-CM | POA: Diagnosis not present

## 2022-09-10 DIAGNOSIS — E039 Hypothyroidism, unspecified: Secondary | ICD-10-CM | POA: Diagnosis not present

## 2022-09-10 MED ORDER — HYDROCHLOROTHIAZIDE 25 MG PO TABS: 25.0000 mg | ORAL_TABLET | Freq: Every day | ORAL | 0 refills | Status: DC

## 2022-10-02 DIAGNOSIS — Z87892 Personal history of anaphylaxis: Secondary | ICD-10-CM | POA: Diagnosis not present

## 2022-10-02 DIAGNOSIS — Z6833 Body mass index (BMI) 33.0-33.9, adult: Secondary | ICD-10-CM | POA: Diagnosis not present

## 2022-10-02 DIAGNOSIS — M1990 Unspecified osteoarthritis, unspecified site: Secondary | ICD-10-CM | POA: Diagnosis not present

## 2022-10-02 DIAGNOSIS — E785 Hyperlipidemia, unspecified: Secondary | ICD-10-CM | POA: Diagnosis not present

## 2022-10-02 DIAGNOSIS — Z833 Family history of diabetes mellitus: Secondary | ICD-10-CM | POA: Diagnosis not present

## 2022-10-02 DIAGNOSIS — Z87891 Personal history of nicotine dependence: Secondary | ICD-10-CM | POA: Diagnosis not present

## 2022-10-02 DIAGNOSIS — Z791 Long term (current) use of non-steroidal anti-inflammatories (NSAID): Secondary | ICD-10-CM | POA: Diagnosis not present

## 2022-10-02 DIAGNOSIS — I10 Essential (primary) hypertension: Secondary | ICD-10-CM | POA: Diagnosis not present

## 2022-10-02 DIAGNOSIS — Z8249 Family history of ischemic heart disease and other diseases of the circulatory system: Secondary | ICD-10-CM | POA: Diagnosis not present

## 2022-10-02 DIAGNOSIS — E119 Type 2 diabetes mellitus without complications: Secondary | ICD-10-CM | POA: Diagnosis not present

## 2022-10-02 DIAGNOSIS — Z7984 Long term (current) use of oral hypoglycemic drugs: Secondary | ICD-10-CM | POA: Diagnosis not present

## 2022-10-02 DIAGNOSIS — E039 Hypothyroidism, unspecified: Secondary | ICD-10-CM | POA: Diagnosis not present

## 2022-10-09 ENCOUNTER — Other Ambulatory Visit: Payer: Self-pay | Admitting: Family

## 2022-10-09 DIAGNOSIS — E1165 Type 2 diabetes mellitus with hyperglycemia: Secondary | ICD-10-CM

## 2022-10-09 NOTE — Telephone Encounter (Signed)
Requested Prescriptions  Pending Prescriptions Disp Refills   metFORMIN (GLUCOPHAGE-XR) 500 MG 24 hr tablet [Pharmacy Med Name: METFORMIN HCL ER 500 MG TABLET] 90 tablet 0    Sig: TAKE 1 TABLET BY MOUTH EVERY DAY WITH BREAKFAST     Endocrinology:  Diabetes - Biguanides Failed - 10/09/2022  2:47 AM      Failed - B12 Level in normal range and within 720 days    Vitamin B-12  Date Value Ref Range Status  11/07/2009 788 211 - 911 pg/mL Final         Failed - CBC within normal limits and completed in the last 12 months    WBC  Date Value Ref Range Status  07/16/2022 5.2 3.4 - 10.8 x10E3/uL Final  06/22/2019 7.1 3.8 - 10.8 Thousand/uL Final   RBC  Date Value Ref Range Status  07/16/2022 5.15 3.77 - 5.28 x10E6/uL Final  06/22/2019 5.26 (H) 3.80 - 5.10 Million/uL Final   Hemoglobin  Date Value Ref Range Status  07/16/2022 15.7 11.1 - 15.9 g/dL Final   Hematocrit  Date Value Ref Range Status  07/16/2022 47.0 (H) 34.0 - 46.6 % Final   MCHC  Date Value Ref Range Status  07/16/2022 33.4 31.5 - 35.7 g/dL Final  16/02/9603 54.0 32.0 - 36.0 g/dL Final   [facility]  Date Value Ref Range Status  07/16/2022 30.5 26.6 - 33.0 pg Final  06/22/2019 30.6 27.0 - 33.0 pg Final   MCV  Date Value Ref Range Status  07/16/2022 91 79 - 97 fL Final   Platelet Count, POC  Date Value Ref Range Status  11/19/2015 227 142 - 424 K/uL Final   RDW  Date Value Ref Range Status  07/16/2022 12.7 11.7 - 15.4 % Final   RDW, POC  Date Value Ref Range Status  11/19/2015 12.8 % Final         Passed - Cr in normal range and within 360 days    Creat  Date Value Ref Range Status  08/09/2019 0.92 0.50 - 1.05 mg/dL Final    Comment:    For patients >38 years of age, the reference limit for Creatinine is approximately 13% higher for people identified as African-American. .    Creatinine, Ser  Date Value Ref Range Status  07/16/2022 0.85 0.57 - 1.00 mg/dL Final   Creatinine,U  Date Value Ref Range  Status  08/11/2012 108.5 mg/dL Final         Passed - HBA1C is between 0 and 7.9 and within 180 days    HbA1c, POC (prediabetic range)  Date Value Ref Range Status  09/03/2022 6.3 5.7 - 6.4 % Final         Passed - eGFR in normal range and within 360 days    GFR, Est African American  Date Value Ref Range Status  08/09/2019 79 > OR = 60 mL/min/1.47m2 Final   GFR, Est Non African American  Date Value Ref Range Status  08/09/2019 68 > OR = 60 mL/min/1.17m2 Final   GFR  Date Value Ref Range Status  10/27/2016 63.06 >60.00 mL/min Final   eGFR  Date Value Ref Range Status  07/16/2022 77 >59 mL/min/1.73 Final         Passed - Valid encounter within last 6 months    Recent Outpatient Visits           1 month ago Type 2 diabetes mellitus with hyperglycemia, without long-term current use of insulin (HCC)   [place]  [facility], Amy J, NP   1 month ago Type 2 diabetes mellitus with hyperglycemia, without long-term current use of insulin [facility])   [place] [facility], [place] J, NP   2 months ago Type 2 diabetes mellitus with hyperglycemia, without long-term current use of insulin [facility])   [place] [facility], [place], NP   8 months ago Prediabetes   [facility], [place], NP   1 year ago Essential (primary) hypertension   [place] [facility], Salomon Fick, NP       Future Appointments             In 2 months Rema Fendt, NP [facility] at [facility]

## 2022-10-14 DIAGNOSIS — M9903 Segmental and somatic dysfunction of lumbar region: Secondary | ICD-10-CM | POA: Diagnosis not present

## 2022-10-14 DIAGNOSIS — M545 Low back pain, unspecified: Secondary | ICD-10-CM | POA: Diagnosis not present

## 2022-10-14 DIAGNOSIS — M9905 Segmental and somatic dysfunction of pelvic region: Secondary | ICD-10-CM | POA: Diagnosis not present

## 2022-10-14 DIAGNOSIS — M9904 Segmental and somatic dysfunction of sacral region: Secondary | ICD-10-CM | POA: Diagnosis not present

## 2022-12-09 DIAGNOSIS — M9905 Segmental and somatic dysfunction of pelvic region: Secondary | ICD-10-CM | POA: Diagnosis not present

## 2022-12-09 DIAGNOSIS — M9903 Segmental and somatic dysfunction of lumbar region: Secondary | ICD-10-CM | POA: Diagnosis not present

## 2022-12-09 DIAGNOSIS — M545 Low back pain, unspecified: Secondary | ICD-10-CM | POA: Diagnosis not present

## 2022-12-09 DIAGNOSIS — M9904 Segmental and somatic dysfunction of sacral region: Secondary | ICD-10-CM | POA: Diagnosis not present

## 2022-12-12 ENCOUNTER — Other Ambulatory Visit: Payer: Self-pay | Admitting: Internal Medicine

## 2022-12-14 ENCOUNTER — Other Ambulatory Visit: Payer: Self-pay

## 2022-12-15 ENCOUNTER — Ambulatory Visit: Payer: 59 | Admitting: Family

## 2022-12-15 NOTE — Progress Notes (Deleted)
Patient ID: Adriana Young, female    DOB: 1960-11-22  MRN: [id number]  CC: Chronic Conditions Follow-Up  Subjective: Adriana Young is a 62 y.o. female who presents for chronic conditions follow-up.   Her concerns today include:  DM - Metformin XR  Cards - HTN, HLD Endo - hypothyroidism  Patient Active Problem List   Diagnosis Date Noted   Type 2 diabetes mellitus (HCC) 07/16/2022   Bacterial vaginitis 02/24/2021   Astigmatism of left eye 12/27/2015   Corneal scar, left eye 12/27/2015   Degenerative joint disease (DJD) of hip 10/14/2015   Fatty liver 09/19/2010   Hypothyroidism 06/18/2008   Hyperlipidemia with target LDL less than 100 06/04/2008   Essential hypertension 06/04/2008   GERD 06/04/2008   Generalized OA 06/04/2008     Current Outpatient Medications on File Prior to Visit  Medication Sig Dispense Refill   atorvastatin (LIPITOR) 40 MG tablet TAKE 1 TABLET BY MOUTH EVERY DAY 90 tablet 3   hydrochlorothiazide (HYDRODIURIL) 25 MG tablet Take 1 tablet (25 mg total) by mouth daily. 90 tablet 0   levothyroxine (SYNTHROID) 125 MCG tablet TAKE 1 TABLET BY MOUTH EVERY DAY 90 tablet 0   metFORMIN (GLUCOPHAGE-XR) 500 MG 24 hr tablet TAKE 1 TABLET BY MOUTH EVERY DAY WITH BREAKFAST 90 tablet 0   Multiple Vitamins-Minerals ([facility] SPECTRAVITE PO) Take 1 tablet by mouth daily.     Probiotic Product (PROBIOTIC DAILY PO) Take 1 capsule by mouth daily.     valsartan (DIOVAN) 80 MG tablet TAKE 1 TABLET BY MOUTH EVERY DAY 90 tablet 0   No current facility-administered medications on file prior to visit.    Allergies  Allergen Reactions   Bee Venom Anaphylaxis   Other Hives, Shortness Of Breath, Itching, Swelling and Rash    Bee stings    Crestor [Rosuvastatin]     Myalgias    Poison Ivy Extract Rash    Social History   Socioeconomic History   Marital status: Married    Spouse name: Not on file   Number of children: Not on file   Years of education: Not on file   Highest  education level: Not on file  Occupational History   Not on file  Tobacco Use   Smoking status: Former    Current packs/day: 0.00    Average packs/day: 1 pack/day for 25.0 years (25.0 ttl pk-yrs)    Types: Cigarettes    Start date: 05/26/1975    Quit date: 05/25/2000    Years since quitting: 22.5    Passive exposure: Past   Smokeless tobacco: Never  Vaping Use   Vaping status: Some Days   Start date: 11/26/2018  Substance and Sexual Activity   Alcohol use: No   Drug use: No   Sexual activity: Yes  Other Topics Concern   Not on file  Social History Narrative   Social History      Diet? Just watching- cutting out bread, drinking water      Do you drink/eat things with caffeine? yes      Marital status?        married                            What year were you married? 1980      Do you live in a house, apartment, assisted living, condo, trailer, etc.? house      Is it one or more stories? one  How many persons live in your home? 3      Do you have any pets in your home? (please list) yes 2 dogs, 3 cats, 3 fish      Highest level of education completed? 2 year college      Current or past profession:      Do you exercise?         yes                             Type & how often? gym      Advanced Directives      Do you have a living will? yes      Do you have a DNR form?                                  If not, do you want to discuss one? no      Do you have signed POA/HPOA for forms? yes      Functional Status      Do you have difficulty bathing or dressing yourself? no      Do you have difficulty preparing food or eating? no      Do you have difficulty managing your medications? no      Do you have difficulty managing your finances? no      Do you have difficulty affording your medications? no   Social Determinants of Health   Financial Resource Strain: Not on file  Food Insecurity: Not on file  Transportation Needs: Not on file  Physical Activity:  Not on file  Stress: Not on file  Social Connections: Not on file  Intimate Partner Violence: Not on file    Family History  Problem Relation Age of Onset   Heart disease Mother    Diabetes Mother    Hypertension Mother    Alcohol abuse Other    Arthritis Other    Hyperlipidemia Other    Hypertension Other    Stroke Other        Female 1st degree relative < 60   Diabetes Maternal Grandmother    Colon cancer Neg Hx    Esophageal cancer Neg Hx    Stomach cancer Neg Hx    Rectal cancer Neg Hx     Past Surgical History:  Procedure Laterality Date   BREAST SURGERY     Lumpectomy- left breast, no lymph node involvement    ROS: Review of Systems Negative except as stated above  PHYSICAL EXAM: LMP 10/25/2014   Physical Exam  {female adult master:310786} {female adult master:310785}     Latest Ref Rng & Units 07/16/2022    8:57 AM 09/15/2021    3:00 PM 04/07/2021   12:00 AM  CMP  Glucose 70 - 99 mg/dL 161  096  89   BUN 8 - 27 mg/dL 17  22  16    Creatinine 0.57 - 1.00 mg/dL 0.45  4.09  8.11   Sodium 134 - 144 mmol/L 140  143  143   Potassium 3.5 - 5.2 mmol/L 3.6  3.7  4.2   Chloride 96 - 106 mmol/L 100  103  102   CO2 20 - 29 mmol/L 19  25  23    Calcium 8.7 - 10.3 mg/dL 9.6  9.8  9.5   Total Protein 6.0 - 8.5 g/dL 6.7  Total Bilirubin 0.0 - 1.2 mg/dL 0.4     Alkaline Phos 44 - 121 IU/L 94     AST 0 - 40 IU/L 19     ALT 0 - 32 IU/L 22      Lipid Panel     Component Value Date/Time   CHOL 186 03/31/2022 0818   TRIG 158 (H) 03/31/2022 0818   HDL 48 03/31/2022 0818   CHOLHDL 3.9 03/31/2022 0818   CHOLHDL 3.6 06/22/2019 0805   VLDL 22.4 10/27/2016 1515   LDLCALC 110 (H) 03/31/2022 0818   LDLCALC 121 (H) 06/22/2019 0805   LDLDIRECT 189.4 07/18/2009 1037    CBC    Component Value Date/Time   WBC 5.2 07/16/2022 0857   WBC 7.1 06/22/2019 0805   RBC 5.15 07/16/2022 0857   RBC 5.26 (H) 06/22/2019 0805   HGB 15.7 07/16/2022 0857   HCT 47.0 (H) 07/16/2022  0857   PLT 197 07/16/2022 0857   MCV 91 07/16/2022 0857   MCH 30.5 07/16/2022 0857   MCH 30.6 06/22/2019 0805   MCHC 33.4 07/16/2022 0857   MCHC 33.6 06/22/2019 0805   RDW 12.7 07/16/2022 0857   LYMPHSABS 2,279 06/22/2019 0805   MONOABS 0.6 10/27/2016 1515   EOSABS 149 06/22/2019 0805   BASOSABS 92 06/22/2019 0805    ASSESSMENT AND PLAN:  There are no diagnoses linked to this encounter.   Patient was given the opportunity to ask questions.  Patient verbalized understanding of the plan and was able to repeat key elements of the plan. Patient was given clear instructions to go to Emergency Department or return to medical center if symptoms don't improve, worsen, or new problems develop.The patient verbalized understanding.   No orders of the defined types were placed in this encounter.    Requested Prescriptions    No prescriptions requested or ordered in this encounter    No follow-ups on file.  Rema Fendt, NP

## 2022-12-16 ENCOUNTER — Ambulatory Visit: Payer: 59 | Admitting: Family

## 2022-12-25 ENCOUNTER — Other Ambulatory Visit: Payer: Self-pay | Admitting: Family

## 2022-12-25 ENCOUNTER — Encounter: Payer: Self-pay | Admitting: Family

## 2022-12-25 ENCOUNTER — Other Ambulatory Visit: Payer: Self-pay

## 2022-12-25 ENCOUNTER — Ambulatory Visit: Payer: 59 | Admitting: Family

## 2022-12-25 VITALS — BP 118/80 | HR 72 | Temp 98.1°F | Ht 68.5 in | Wt 219.8 lb

## 2022-12-25 DIAGNOSIS — E119 Type 2 diabetes mellitus without complications: Secondary | ICD-10-CM

## 2022-12-25 DIAGNOSIS — Z7984 Long term (current) use of oral hypoglycemic drugs: Secondary | ICD-10-CM | POA: Diagnosis not present

## 2022-12-25 DIAGNOSIS — I10 Essential (primary) hypertension: Secondary | ICD-10-CM

## 2022-12-25 DIAGNOSIS — F419 Anxiety disorder, unspecified: Secondary | ICD-10-CM

## 2022-12-25 DIAGNOSIS — E1165 Type 2 diabetes mellitus with hyperglycemia: Secondary | ICD-10-CM

## 2022-12-25 LAB — POCT GLYCOSYLATED HEMOGLOBIN (HGB A1C): HbA1c, POC (controlled diabetic range): 6.2 % (ref 0.0–7.0)

## 2022-12-25 MED ORDER — HYDROXYZINE PAMOATE 25 MG PO CAPS
25.0000 mg | ORAL_CAPSULE | Freq: Three times a day (TID) | ORAL | 1 refills | Status: DC | PRN
Start: 2022-12-25 — End: 2023-06-08

## 2022-12-25 MED ORDER — METFORMIN HCL ER 500 MG PO TB24
500.0000 mg | ORAL_TABLET | Freq: Every day | ORAL | 0 refills | Status: DC
Start: 2022-12-25 — End: 2023-03-24

## 2022-12-25 MED ORDER — VALSARTAN 80 MG PO TABS
80.0000 mg | ORAL_TABLET | Freq: Every day | ORAL | 0 refills | Status: DC
Start: 2022-12-25 — End: 2023-06-08

## 2022-12-25 MED ORDER — VALSARTAN 80 MG PO TABS: 80.0000 mg | ORAL_TABLET | Freq: Every day | ORAL | 0 refills | Status: DC

## 2022-12-25 NOTE — Progress Notes (Signed)
Pt states tiredness all the time.

## 2022-12-25 NOTE — Telephone Encounter (Signed)
Rx sent in 12/25/2022 @ 8:51am

## 2022-12-25 NOTE — Progress Notes (Signed)
Patient ID: Adriana Young, female    DOB: [birth year]  MRN: [id number]  CC: Chronic Conditions Follow-Up  Subjective: Adriana Young is a 62 y.o. female who presents for chronic conditions follow-up.   Her concerns today include:  - Doing well on Metformin XR, no issues/concerns. She denies red flag symptoms associated with diabetes.  - Anxiety and tiredness related to caregiver to her mother and husband. States increased irritability and feels "awful" when she becomes this way. Reports her husband is not supportive with household responsibilities due to "how he was raised". Reports sometimes she is able to take a vacation when her family member from [place] visits. She denies thoughts of self-harm, suicidal ideations, homicidal ideations. - Established with Cardiology. - Established with Endocrinology.  Patient Active Problem List   Diagnosis Date Noted   Type 2 diabetes mellitus (HCC) 07/16/2022   Bacterial vaginitis 02/24/2021   Astigmatism of left eye 12/27/2015   Corneal scar, left eye 12/27/2015   Degenerative joint disease (DJD) of hip 10/14/2015   Fatty liver 09/19/2010   Hypothyroidism 06/18/2008   Hyperlipidemia with target LDL less than 100 06/04/2008   Essential hypertension 06/04/2008   GERD 06/04/2008   Generalized OA 06/04/2008     Current Outpatient Medications on File Prior to Visit  Medication Sig Dispense Refill   atorvastatin (LIPITOR) 40 MG tablet TAKE 1 TABLET BY MOUTH EVERY DAY 90 tablet 3   levothyroxine (SYNTHROID) 125 MCG tablet TAKE 1 TABLET BY MOUTH EVERY DAY 90 tablet 0   Multiple Vitamins-Minerals ([facility] SPECTRAVITE PO) Take 1 tablet by mouth daily.     Probiotic Product (PROBIOTIC DAILY PO) Take 1 capsule by mouth daily.     hydrochlorothiazide (HYDRODIURIL) 25 MG tablet Take 1 tablet (25 mg total) by mouth daily. 90 tablet 0   No current facility-administered medications on file prior to visit.    Allergies  Allergen Reactions   Bee Venom Anaphylaxis    Other Hives, Shortness Of Breath, Itching, Swelling and Rash    Bee stings    Crestor [Rosuvastatin]     Myalgias    Poison Ivy Extract Rash    Social History   Socioeconomic History   Marital status: Married    Spouse name: Not on file   Number of children: Not on file   Years of education: Not on file   Highest education level: Not on file  Occupational History   Not on file  Tobacco Use   Smoking status: Former    Current packs/day: 0.00    Average packs/day: 1 pack/day for 25.0 years (25.0 ttl pk-yrs)    Types: Cigarettes    Start date: 05/26/1975    Quit date: 05/25/2000    Years since quitting: 22.6    Passive exposure: Past   Smokeless tobacco: Never  Vaping Use   Vaping status: Some Days   Start date: 11/26/2018  Substance and Sexual Activity   Alcohol use: No   Drug use: No   Sexual activity: Yes  Other Topics Concern   Not on file  Social History Narrative   Social History      Diet? Just watching- cutting out bread, drinking water      Do you drink/eat things with caffeine? yes      Marital status?        married  What year were you married? 1980      Do you live in a house, apartment, assisted living, condo, trailer, etc.? house      Is it one or more stories? one      How many persons live in your home? 3      Do you have any pets in your home? (please list) yes 2 dogs, 3 cats, 3 fish      Highest level of education completed? 2 year college      Current or past profession:      Do you exercise?         yes                             Type & how often? gym      Advanced Directives      Do you have a living will? yes      Do you have a DNR form?                                  If not, do you want to discuss one? no      Do you have signed POA/HPOA for forms? yes      Functional Status      Do you have difficulty bathing or dressing yourself? no      Do you have difficulty preparing food or eating? no      Do  you have difficulty managing your medications? no      Do you have difficulty managing your finances? no      Do you have difficulty affording your medications? no   Social Determinants of Health   Financial Resource Strain: Not on file  Food Insecurity: Not on file  Transportation Needs: Not on file  Physical Activity: Not on file  Stress: Not on file  Social Connections: Not on file  Intimate Partner Violence: Not on file    Family History  Problem Relation Age of Onset   Heart disease Mother    Diabetes Mother    Hypertension Mother    Alcohol abuse Other    Arthritis Other    Hyperlipidemia Other    Hypertension Other    Stroke Other        Female 1st degree relative < 60   Diabetes Maternal Grandmother    Colon cancer Neg Hx    Esophageal cancer Neg Hx    Stomach cancer Neg Hx    Rectal cancer Neg Hx     Past Surgical History:  Procedure Laterality Date   BREAST SURGERY     Lumpectomy- left breast, no lymph node involvement    ROS: Review of Systems Negative except as stated above  PHYSICAL EXAM: BP 118/80   Pulse 72   Temp 98.1 F (36.7 C) (Oral)   Ht 5' 8.5" (1.74 m)   Wt 219 lb 12.8 oz (99.7 kg)   LMP 10/25/2014   SpO2 98%   BMI 32.93 kg/m   Physical Exam HENT:     Head: Normocephalic and atraumatic.     Nose: Nose normal.     Mouth/Throat:     Mouth: Mucous membranes are moist.     Pharynx: Oropharynx is clear.  Eyes:     Extraocular Movements: Extraocular movements intact.     Conjunctiva/sclera: Conjunctivae normal.     Pupils:  Pupils are equal, round, and reactive to light.  Cardiovascular:     Rate and Rhythm: Normal rate and regular rhythm.     Pulses: Normal pulses.     Heart sounds: Normal heart sounds.  Pulmonary:     Effort: Pulmonary effort is normal.     Breath sounds: Normal breath sounds.  Musculoskeletal:        General: Normal range of motion.     Cervical back: Normal range of motion and neck supple.  Neurological:      General: No focal deficit present.     Mental Status: She is alert and oriented to person, place, and time.  Psychiatric:        Mood and Affect: Affect is tearful.    Results for orders placed or performed in visit on 12/25/22  POCT glycosylated hemoglobin (Hb A1C)  Result Value Ref Range   Hemoglobin A1C     HbA1c POC (<> result, manual entry)     HbA1c, POC (prediabetic range)     HbA1c, POC (controlled diabetic range) 6.2 0.0 - 7.0 %    ASSESSMENT AND PLAN: 1. Type 2 diabetes mellitus with hyperglycemia, without long-term current use of insulin (HCC) - Hemoglobin A1c at goal at 6.2%, goal 7%. This is improved compared to previous 6.5%.  - Continue Metformin XR as prescribed.  - Routine screening.  - Discussed the importance of healthy eating habits, low-carbohydrate diet, low-sugar diet, regular aerobic exercise (at least 150 minutes a week as tolerated) and medication compliance to achieve or maintain control of diabetes. - Follow-up with primary provider in 3 months or sooner if needed. - POCT glycosylated hemoglobin (Hb A1C) - Microalbumin / creatinine urine ratio - metFORMIN (GLUCOPHAGE-XR) 500 MG 24 hr tablet; Take 1 tablet (500 mg total) by mouth daily with breakfast.  Dispense: 90 tablet; Refill: 0  2. Diabetic eye exam [facility]) - Referral to Ophthalmology for further evaluation/management.  - Ambulatory referral to Ophthalmology  3. Encounter for diabetic foot exam (HCC) - Completed today in office.   4. Anxiety - Patient denies thoughts of self-harm, suicidal ideations, homicidal ideations. - Hydroxyzine as prescribed. Counseled on medication adherence/adverse effects.  - Patient declined referral to Psychiatry.  - Follow-up with primary provider in 4 weeks or sooner if needed.  - hydrOXYzine (VISTARIL) 25 MG capsule; Take 1 capsule (25 mg total) by mouth every 8 (eight) hours as needed.  Dispense: 30 capsule; Refill: 1   Patient was given the opportunity to  ask questions.  Patient verbalized understanding of the plan and was able to repeat key elements of the plan. Patient was given clear instructions to go to Emergency Department or return to medical center if symptoms don't improve, worsen, or new problems develop.The patient verbalized understanding.   Orders Placed This Encounter  Procedures   Microalbumin / creatinine urine ratio   Ambulatory referral to Ophthalmology   POCT glycosylated hemoglobin (Hb A1C)     Requested Prescriptions   Signed Prescriptions Disp Refills   metFORMIN (GLUCOPHAGE-XR) 500 MG 24 hr tablet 90 tablet 0    Sig: Take 1 tablet (500 mg total) by mouth daily with breakfast.   hydrOXYzine (VISTARIL) 25 MG capsule 30 capsule 1    Sig: Take 1 capsule (25 mg total) by mouth every 8 (eight) hours as needed.    Return in about 3 months (around 03/27/2023) for Follow-Up or next available chronic care mgmt and 4 weeks anxiety.  Rema Fendt, NP

## 2023-01-07 ENCOUNTER — Other Ambulatory Visit: Payer: Self-pay

## 2023-01-07 ENCOUNTER — Other Ambulatory Visit: Payer: Self-pay | Admitting: Family

## 2023-01-07 DIAGNOSIS — I10 Essential (primary) hypertension: Secondary | ICD-10-CM

## 2023-01-07 MED ORDER — HYDROCHLOROTHIAZIDE 25 MG PO TABS: 25.0000 mg | ORAL_TABLET | Freq: Every day | ORAL | 0 refills | Status: DC

## 2023-02-03 DIAGNOSIS — M9904 Segmental and somatic dysfunction of sacral region: Secondary | ICD-10-CM | POA: Diagnosis not present

## 2023-02-03 DIAGNOSIS — M545 Low back pain, unspecified: Secondary | ICD-10-CM | POA: Diagnosis not present

## 2023-02-03 DIAGNOSIS — M9903 Segmental and somatic dysfunction of lumbar region: Secondary | ICD-10-CM | POA: Diagnosis not present

## 2023-02-03 DIAGNOSIS — M9905 Segmental and somatic dysfunction of pelvic region: Secondary | ICD-10-CM | POA: Diagnosis not present

## 2023-02-26 ENCOUNTER — Other Ambulatory Visit: Payer: Self-pay | Admitting: Family

## 2023-02-26 DIAGNOSIS — Z1211 Encounter for screening for malignant neoplasm of colon: Secondary | ICD-10-CM

## 2023-02-26 DIAGNOSIS — Z1212 Encounter for screening for malignant neoplasm of rectum: Secondary | ICD-10-CM

## 2023-03-07 DIAGNOSIS — Z1212 Encounter for screening for malignant neoplasm of rectum: Secondary | ICD-10-CM | POA: Diagnosis not present

## 2023-03-07 DIAGNOSIS — Z1211 Encounter for screening for malignant neoplasm of colon: Secondary | ICD-10-CM | POA: Diagnosis not present

## 2023-03-15 ENCOUNTER — Other Ambulatory Visit: Payer: Self-pay | Admitting: Family

## 2023-03-15 DIAGNOSIS — I10 Essential (primary) hypertension: Secondary | ICD-10-CM

## 2023-03-15 LAB — COLOGUARD: COLOGUARD: NEGATIVE

## 2023-03-15 MED ORDER — HYDROCHLOROTHIAZIDE 25 MG PO TABS: 25.0000 mg | ORAL_TABLET | Freq: Every day | ORAL | 0 refills | Status: DC

## 2023-03-20 ENCOUNTER — Other Ambulatory Visit: Payer: Self-pay | Admitting: Family

## 2023-03-20 DIAGNOSIS — I10 Essential (primary) hypertension: Secondary | ICD-10-CM

## 2023-03-24 ENCOUNTER — Other Ambulatory Visit: Payer: Self-pay | Admitting: Family

## 2023-03-24 DIAGNOSIS — E1165 Type 2 diabetes mellitus with hyperglycemia: Secondary | ICD-10-CM

## 2023-03-24 MED ORDER — METFORMIN HCL ER 500 MG PO TB24: 500.0000 mg | ORAL_TABLET | Freq: Every day | ORAL | 0 refills | Status: DC

## 2023-03-31 DIAGNOSIS — M9905 Segmental and somatic dysfunction of pelvic region: Secondary | ICD-10-CM | POA: Diagnosis not present

## 2023-03-31 DIAGNOSIS — M9904 Segmental and somatic dysfunction of sacral region: Secondary | ICD-10-CM | POA: Diagnosis not present

## 2023-03-31 DIAGNOSIS — M9903 Segmental and somatic dysfunction of lumbar region: Secondary | ICD-10-CM | POA: Diagnosis not present

## 2023-03-31 DIAGNOSIS — M545 Low back pain, unspecified: Secondary | ICD-10-CM | POA: Diagnosis not present

## 2023-04-12 ENCOUNTER — Other Ambulatory Visit: Payer: Self-pay | Admitting: Family

## 2023-04-12 DIAGNOSIS — I10 Essential (primary) hypertension: Secondary | ICD-10-CM

## 2023-05-31 ENCOUNTER — Other Ambulatory Visit: Payer: Self-pay | Admitting: Family

## 2023-05-31 DIAGNOSIS — E1165 Type 2 diabetes mellitus with hyperglycemia: Secondary | ICD-10-CM

## 2023-05-31 DIAGNOSIS — I10 Essential (primary) hypertension: Secondary | ICD-10-CM

## 2023-06-01 ENCOUNTER — Telehealth: Payer: Self-pay | Admitting: Family

## 2023-06-01 ENCOUNTER — Other Ambulatory Visit: Payer: Self-pay

## 2023-06-01 NOTE — Telephone Encounter (Signed)
 Pt has been scheduled for appt for med refill on 01/14

## 2023-06-01 NOTE — Telephone Encounter (Signed)
 Copied from CRM [id number]. Topic: General - Other >> Jun 01, 2023 11:46 AM Franchot Heidelberg wrote: Reason for CRM: Pt called checking the status of her refill, does patient need an appt to receive refills? Please advise   Best contact: [phone]

## 2023-06-08 ENCOUNTER — Ambulatory Visit (INDEPENDENT_AMBULATORY_CARE_PROVIDER_SITE_OTHER): Payer: 59 | Admitting: Family

## 2023-06-08 VITALS — BP 137/98 | HR 72 | Temp 98.1°F | Ht 68.0 in | Wt 221.0 lb

## 2023-06-08 DIAGNOSIS — E039 Hypothyroidism, unspecified: Secondary | ICD-10-CM

## 2023-06-08 DIAGNOSIS — E1165 Type 2 diabetes mellitus with hyperglycemia: Secondary | ICD-10-CM | POA: Diagnosis not present

## 2023-06-08 DIAGNOSIS — Z7984 Long term (current) use of oral hypoglycemic drugs: Secondary | ICD-10-CM | POA: Diagnosis not present

## 2023-06-08 DIAGNOSIS — E119 Type 2 diabetes mellitus without complications: Secondary | ICD-10-CM

## 2023-06-08 DIAGNOSIS — I10 Essential (primary) hypertension: Secondary | ICD-10-CM

## 2023-06-08 DIAGNOSIS — F419 Anxiety disorder, unspecified: Secondary | ICD-10-CM

## 2023-06-08 DIAGNOSIS — E785 Hyperlipidemia, unspecified: Secondary | ICD-10-CM

## 2023-06-08 MED ORDER — ATORVASTATIN CALCIUM 40 MG PO TABS: 40.0000 mg | ORAL_TABLET | Freq: Every day | ORAL | 0 refills | Status: DC

## 2023-06-08 MED ORDER — LEVOTHYROXINE SODIUM 125 MCG PO TABS: 125.0000 ug | ORAL_TABLET | Freq: Every day | ORAL | 0 refills | Status: DC

## 2023-06-08 MED ORDER — HYDROCHLOROTHIAZIDE 25 MG PO TABS: 25.0000 mg | ORAL_TABLET | Freq: Every day | ORAL | 0 refills | Status: DC

## 2023-06-08 MED ORDER — VALSARTAN 80 MG PO TABS
80.0000 mg | ORAL_TABLET | Freq: Every day | ORAL | 0 refills | Status: DC
Start: 2023-06-08 — End: 2023-09-07

## 2023-06-08 MED ORDER — METFORMIN HCL ER 500 MG PO TB24: 500.0000 mg | ORAL_TABLET | Freq: Every day | ORAL | 0 refills | Status: DC

## 2023-06-08 MED ORDER — HYDROXYZINE PAMOATE 25 MG PO CAPS: 25.0000 mg | ORAL_CAPSULE | Freq: Three times a day (TID) | ORAL | 1 refills | Status: DC | PRN

## 2023-06-08 NOTE — Progress Notes (Signed)
 Patient ID: Adriana Young, female    DOB: 1961-05-20  MRN: [id number]  CC: Chronic Conditions Follow-Up  Subjective: Adriana Young is a 63 y.o. female who presents for chronic conditions follow-up.   Her concerns today include: - Doing well on Valsartan  and Hydrochlorothiazide , no issues/concerns. States she has been spacing out blood pressure medications for 2 months due to health insurance would not cover cost. She does not complain of red flag symptoms such as but not limited to chest pain, shortness of breath, worst headache of life, nausea/vomiting.  - Doing well on Metformin  XR, no issues/concerns. She denies red flag symptoms associated with diabetes.  - Doing well on Atorvastatin , no issues/concerns.  - Doing well on Levothyroxine , no issues/concerns. - Doing well on Hydroxyzine , no issues/concerns. She denies thoughts of self-harm, suicidal ideations, homicidal ideations.  Patient Active Problem List   Diagnosis Date Noted   Type 2 diabetes mellitus (HCC) 07/16/2022   Bacterial vaginitis 02/24/2021   Astigmatism of left eye 12/27/2015   Corneal scar, left eye 12/27/2015   Degenerative joint disease (DJD) of hip 10/14/2015   Fatty liver 09/19/2010   Hypothyroidism 06/18/2008   Hyperlipidemia with target LDL less than 100 06/04/2008   Essential hypertension 06/04/2008   GERD 06/04/2008   Generalized OA 06/04/2008     Current Outpatient Medications on File Prior to Visit  Medication Sig Dispense Refill   Cholecalciferol ([facility] D3) 125 MCG (5000 UT) capsule Take 5,000 Units by mouth daily.     cyanocobalamin  (VITAMIN B12) 1000 MCG tablet Take 1,000 mcg by mouth daily.     Magnesium 250 MG CAPS Take 250 mg by mouth daily.     Multiple Vitamins-Minerals ([facility] SPECTRAVITE PO) Take 1 tablet by mouth daily.     Probiotic Product (PROBIOTIC DAILY PO) Take 1 capsule by mouth daily.     No current facility-administered medications on file prior to visit.    Allergies  Allergen  Reactions   Bee Venom Anaphylaxis   Other Hives, Shortness Of Breath, Itching, Swelling and Rash    Bee stings    Crestor  [Rosuvastatin ]     Myalgias    Poison Ivy Extract Rash    Social History   Socioeconomic History   Marital status: Married    Spouse name: Not on file   Number of children: Not on file   Years of education: Not on file   Highest education level: Associate degree: occupational, scientist, product/process development, or vocational program  Occupational History   Not on file  Tobacco Use   Smoking status: Former    Current packs/day: 0.00    Average packs/day: 1 pack/day for 25.0 years (25.0 ttl pk-yrs)    Types: Cigarettes    Start date: 05/26/1975    Quit date: 05/25/2000    Years since quitting: 23.0    Passive exposure: Past   Smokeless tobacco: Never  Vaping Use   Vaping status: Some Days   Start date: 11/26/2018  Substance and Sexual Activity   Alcohol use: No   Drug use: No   Sexual activity: Yes  Other Topics Concern   Not on file  Social History Narrative   Social History      Diet? Just watching- cutting out bread, drinking water      Do you drink/eat things with caffeine? yes      Marital status?        married  What year were you married? 1980      Do you live in a house, apartment, assisted living, condo, trailer, etc.? house      Is it one or more stories? one      How many persons live in your home? 3      Do you have any pets in your home? (please list) yes 2 dogs, 3 cats, 3 fish      Highest level of education completed? 2 year college      Current or past profession:      Do you exercise?         yes                             Type & how often? gym      Advanced Directives      Do you have a living will? yes      Do you have a DNR form?                                  If not, do you want to discuss one? no      Do you have signed POA/HPOA for forms? yes      Functional Status      Do you have difficulty bathing or  dressing yourself? no      Do you have difficulty preparing food or eating? no      Do you have difficulty managing your medications? no      Do you have difficulty managing your finances? no      Do you have difficulty affording your medications? no   Social Drivers of Corporate Investment Banker Strain: Low Risk  (06/07/2023)   Overall Financial Resource Strain (CARDIA)    Difficulty of Paying Living Expenses: Not hard at all  Food Insecurity: No Food Insecurity (06/07/2023)   Hunger Vital Sign    Worried About Running Out of Food in the Last Year: Never true    Ran Out of Food in the Last Year: Never true  Transportation Needs: No Transportation Needs (06/07/2023)   PRAPARE - Administrator, Civil Service (Medical): No    Lack of Transportation (Non-Medical): No  Physical Activity: Insufficiently Active (06/07/2023)   Exercise Vital Sign    Days of Exercise per Week: 4 days    Minutes of Exercise per Session: 20 min  Stress: No Stress Concern Present (06/07/2023)   [employer] of Occupational Health - Occupational Stress Questionnaire    Feeling of Stress : Only a little  Social Connections: Unknown (06/07/2023)   Social Connection and Isolation Panel [NHANES]    Frequency of Communication with Friends and Family: Three times a week    Frequency of Social Gatherings with Friends and Family: Twice a week    Attends Religious Services: Patient declined    Database Administrator or Organizations: No    Attends Engineer, Structural: Not on file    Marital Status: Married  Catering Manager Violence: Not on file    Family History  Problem Relation Age of Onset   Heart disease Mother    Diabetes Mother    Hypertension Mother    Alcohol abuse Other    Arthritis Other    Hyperlipidemia Other    Hypertension Other    Stroke Other  Female 1st degree relative < 60   Diabetes Maternal Grandmother    Colon cancer Neg Hx    Esophageal cancer Neg Hx     Stomach cancer Neg Hx    Rectal cancer Neg Hx     Past Surgical History:  Procedure Laterality Date   BREAST SURGERY     Lumpectomy- left breast, no lymph node involvement    ROS: Review of Systems Negative except as stated above  PHYSICAL EXAM: BP (!) 137/98   Pulse 72   Temp 98.1 F (36.7 C) (Oral)   Ht 5' 8 (1.727 m)   Wt 221 lb (100.2 kg)   LMP 10/25/2014   SpO2 96%   BMI 33.60 kg/m   Physical Exam HENT:     Head: Normocephalic and atraumatic.     Nose: Nose normal.     Mouth/Throat:     Mouth: Mucous membranes are moist.     Pharynx: Oropharynx is clear.  Eyes:     Extraocular Movements: Extraocular movements intact.     Conjunctiva/sclera: Conjunctivae normal.     Pupils: Pupils are equal, round, and reactive to light.  Cardiovascular:     Rate and Rhythm: Normal rate and regular rhythm.     Pulses: Normal pulses.     Heart sounds: Normal heart sounds.  Pulmonary:     Effort: Pulmonary effort is normal.     Breath sounds: Normal breath sounds.  Musculoskeletal:        General: Normal range of motion.     Cervical back: Normal range of motion and neck supple.  Neurological:     General: No focal deficit present.     Mental Status: She is alert and oriented to person, place, and time.  Psychiatric:        Mood and Affect: Mood normal.        Behavior: Behavior normal.     ASSESSMENT AND PLAN: 1. Primary hypertension (Primary) - Blood pressure not at goal during today's visit. Patient asymptomatic without chest pressure, chest pain, palpitations, shortness of breath, worst headache of life, and any additional red flag symptoms. - Resume Valsartan  and Hydrochlorothiazide  as prescribed.  - Routine screening.  - Counseled on blood pressure goal of less than 130/80, low-sodium, DASH diet, medication compliance, and 150 minutes of moderate intensity exercise per week as tolerated. Counseled on medication adherence and adverse effects. - Follow-up with  primary provider in 4 weeks or sooner if needed.  - valsartan  (DIOVAN ) 80 MG tablet; Take 1 tablet (80 mg total) by mouth daily.  Dispense: 90 tablet; Refill: 0 - hydrochlorothiazide  (HYDRODIURIL ) 25 MG tablet; Take 1 tablet (25 mg total) by mouth daily.  Dispense: 90 tablet; Refill: 0 - Basic Metabolic Panel  2. Type 2 diabetes mellitus with hyperglycemia, without long-term current use of insulin (HCC) - Continue Metformin  XR as prescribed. Counseled on medication adherence/adverse effects. - Hemoglobin A1c result pending.  - Discussed the importance of healthy eating habits, low-carbohydrate diet, low-sugar diet, regular aerobic exercise (at least 150 minutes a week as tolerated) and medication compliance to achieve or maintain control of diabetes. - Follow-up with primary provider as scheduled.  - metFORMIN  (GLUCOPHAGE -XR) 500 MG 24 hr tablet; Take 1 tablet (500 mg total) by mouth daily with breakfast.  Dispense: 90 tablet; Refill: 0 - POCT glycosylated hemoglobin (Hb A1C); Future  3. Diabetic eye exam [facility]) - Referral to Ophthalmology for evaluation/management.  - Ambulatory referral to Ophthalmology  4. Encounter for diabetic foot exam (  HCC) - Referral to Podiatry for evaluation/management.  - Ambulatory referral to Podiatry  5. Hyperlipidemia, unspecified hyperlipidemia type - Continue Atorvastatin  as prescribed. Counseled on medication adherence/adverse effects. - Routine screening.  - Follow-up with primary provider as scheduled.  - atorvastatin  (LIPITOR) 40 MG tablet; Take 1 tablet (40 mg total) by mouth daily.  Dispense: 90 tablet; Refill: 0 - Lipid panel  6. Hypothyroidism, unspecified type - Continue Levothyroxine  as prescribed. Counseled on medication adherence/adverse effects.  - Routine screening.  - Follow-up with primary provider as scheduled.  - levothyroxine  (SYNTHROID ) 125 MCG tablet; Take 1 tablet (125 mcg total) by mouth daily.  Dispense: 90 tablet; Refill: 0 -  TSH  7. Anxiety - Patient denies thoughts of self-harm, suicidal ideations, homicidal ideations. - Continue Hydroxyzine  as prescribed. Counseled on medication adherence/adverse effects. - Follow-up with primary provider in 3 months or sooner if needed.  - hydrOXYzine  (VISTARIL ) 25 MG capsule; Take 1 capsule (25 mg total) by mouth every 8 (eight) hours as needed.  Dispense: 30 capsule; Refill: 1    Patient was given the opportunity to ask questions.  Patient verbalized understanding of the plan and was able to repeat key elements of the plan. Patient was given clear instructions to go to Emergency Department or return to medical center if symptoms don't improve, worsen, or new problems develop.The patient verbalized understanding.   Orders Placed This Encounter  Procedures   Basic Metabolic Panel   Lipid panel   TSH   Ambulatory referral to Ophthalmology   Ambulatory referral to Podiatry   POCT glycosylated hemoglobin (Hb A1C)     Requested Prescriptions   Signed Prescriptions Disp Refills   hydrOXYzine  (VISTARIL ) 25 MG capsule 30 capsule 1    Sig: Take 1 capsule (25 mg total) by mouth every 8 (eight) hours as needed.   levothyroxine  (SYNTHROID ) 125 MCG tablet 90 tablet 0    Sig: Take 1 tablet (125 mcg total) by mouth daily.   valsartan  (DIOVAN ) 80 MG tablet 90 tablet 0    Sig: Take 1 tablet (80 mg total) by mouth daily.   hydrochlorothiazide  (HYDRODIURIL ) 25 MG tablet 90 tablet 0    Sig: Take 1 tablet (25 mg total) by mouth daily.   metFORMIN  (GLUCOPHAGE -XR) 500 MG 24 hr tablet 90 tablet 0    Sig: Take 1 tablet (500 mg total) by mouth daily with breakfast.   atorvastatin  (LIPITOR) 40 MG tablet 90 tablet 0    Sig: Take 1 tablet (40 mg total) by mouth daily.    Return in about 3 months (around 09/06/2023) for Follow-Up or next available chronic conditions and 4 weeks blood pressure check .  Greig JINNY Drones, NP

## 2023-06-08 NOTE — Progress Notes (Signed)
 Patient states no other concerns to discuss.

## 2023-06-09 ENCOUNTER — Other Ambulatory Visit: Payer: Self-pay | Admitting: Family

## 2023-06-09 DIAGNOSIS — E039 Hypothyroidism, unspecified: Secondary | ICD-10-CM

## 2023-06-09 DIAGNOSIS — E785 Hyperlipidemia, unspecified: Secondary | ICD-10-CM

## 2023-06-09 DIAGNOSIS — N1831 Chronic kidney disease, stage 3a: Secondary | ICD-10-CM

## 2023-06-09 LAB — BASIC METABOLIC PANEL
BUN/Creatinine Ratio: 14 (ref 12–28)
BUN: 17 mg/dL (ref 8–27)
CO2: 26 mmol/L (ref 20–29)
Calcium: 10.3 mg/dL (ref 8.7–10.3)
Chloride: 103 mmol/L (ref 96–106)
Creatinine, Ser: 1.22 mg/dL — ABNORMAL HIGH (ref 0.57–1.00)
Glucose: 113 mg/dL — ABNORMAL HIGH (ref 70–99)
Potassium: 4.2 mmol/L (ref 3.5–5.2)
Sodium: 145 mmol/L — ABNORMAL HIGH (ref 134–144)
eGFR: 50 mL/min/{1.73_m2} — ABNORMAL LOW (ref >59)

## 2023-06-09 LAB — LIPID PANEL
Chol/HDL Ratio: 4 {ratio} (ref 0.0–4.4)
Cholesterol, Total: 269 mg/dL — ABNORMAL HIGH (ref 100–199)
HDL: 67 mg/dL (ref >39)
LDL Chol Calc (NIH): 174 mg/dL — ABNORMAL HIGH (ref 0–99)
Triglycerides: 157 mg/dL — ABNORMAL HIGH (ref 0–149)
VLDL Cholesterol Cal: 28 mg/dL (ref 5–40)

## 2023-06-09 LAB — TSH: TSH: 53.9 u[IU]/mL — ABNORMAL HIGH (ref 0.450–4.500)

## 2023-06-09 MED ORDER — LEVOTHYROXINE SODIUM 137 MCG PO TABS: 137.0000 ug | ORAL_TABLET | Freq: Every day | ORAL | 0 refills | Status: DC

## 2023-06-09 MED ORDER — ATORVASTATIN CALCIUM 80 MG PO TABS: 80.0000 mg | ORAL_TABLET | Freq: Every day | ORAL | 0 refills | Status: DC

## 2023-06-17 DIAGNOSIS — M9905 Segmental and somatic dysfunction of pelvic region: Secondary | ICD-10-CM | POA: Diagnosis not present

## 2023-06-17 DIAGNOSIS — M9904 Segmental and somatic dysfunction of sacral region: Secondary | ICD-10-CM | POA: Diagnosis not present

## 2023-06-17 DIAGNOSIS — M9902 Segmental and somatic dysfunction of thoracic region: Secondary | ICD-10-CM | POA: Diagnosis not present

## 2023-06-17 DIAGNOSIS — M9903 Segmental and somatic dysfunction of lumbar region: Secondary | ICD-10-CM | POA: Diagnosis not present

## 2023-06-21 ENCOUNTER — Ambulatory Visit: Payer: 59 | Admitting: Podiatry

## 2023-06-21 DIAGNOSIS — E119 Type 2 diabetes mellitus without complications: Secondary | ICD-10-CM | POA: Diagnosis not present

## 2023-06-21 NOTE — Progress Notes (Signed)
  Subjective:  Patient ID: Adriana Young, female    DOB: 04-12-1961,   MRN: [id number]  Chief Complaint  Patient presents with   Nail Problem    Pt presents for dfc.    63 y.o. female presents for diabetic foot check. Denies any particular issues with her feet today. Denies burning and tingling in their feet. Patient is diabetic and last A1c was  Lab Results  Component Value Date   HGBA1C 6.2 12/25/2022   .   PCP:  Rema Fendt, NP    . Denies any other pedal complaints. Denies n/v/f/c.   Past Medical History:  Diagnosis Date   Allergy    Arthritis    Phreesia 06/30/2020   GERD (gastroesophageal reflux disease)    Glaucoma    Headache(784.0)    Hyperlipidemia    Hypertension    Osteoarthritis    Thyroid disease    hypo   Type II or unspecified type diabetes mellitus without mention of complication, uncontrolled 09/19/2010    Objective:  Physical Exam: Vascular: DP/PT pulses 2/4 bilateral. CFT <3 seconds. Hair growth present bilateral.   Skin. No lacerations or abrasions bilateral feet. Nails 1-5 are normal in appearance Musculoskeletal: MMT 5/5 bilateral lower extremities in DF, PF, Inversion and Eversion. Deceased ROM in DF of ankle joint.  Neurological: Sensation intact to light touch. Protective sensation intact bilateral.    Assessment:   1. Type 2 diabetes mellitus without complication, without long-term current use of insulin (HCC)      Plan:  Patient was evaluated and treated and all questions answered. -Discussed and educated patient on diabetic foot care, especially with  regards to the vascular, neurological and musculoskeletal systems.  -Stressed the importance of good glycemic control and the detriment of not  controlling glucose levels in relation to the foot. -Discussed supportive shoes at all times and checking feet regularly.   -Answered all patient questions -Patient to return  in 1 year for DM foot check  -Patient advised to call the office if  any problems or questions arise in the meantime.   Louann Sjogren, DPM

## 2023-07-06 ENCOUNTER — Ambulatory Visit (INDEPENDENT_AMBULATORY_CARE_PROVIDER_SITE_OTHER): Payer: 59 | Admitting: Family

## 2023-07-06 ENCOUNTER — Other Ambulatory Visit: Payer: Self-pay | Admitting: Family

## 2023-07-06 VITALS — BP 126/86 | HR 66 | Temp 97.7°F | Ht 68.5 in | Wt 220.8 lb

## 2023-07-06 DIAGNOSIS — E1165 Type 2 diabetes mellitus with hyperglycemia: Secondary | ICD-10-CM

## 2023-07-06 DIAGNOSIS — I10 Essential (primary) hypertension: Secondary | ICD-10-CM

## 2023-07-06 DIAGNOSIS — E039 Hypothyroidism, unspecified: Secondary | ICD-10-CM

## 2023-07-06 DIAGNOSIS — E785 Hyperlipidemia, unspecified: Secondary | ICD-10-CM | POA: Diagnosis not present

## 2023-07-06 DIAGNOSIS — E119 Type 2 diabetes mellitus without complications: Secondary | ICD-10-CM

## 2023-07-06 DIAGNOSIS — Z7984 Long term (current) use of oral hypoglycemic drugs: Secondary | ICD-10-CM | POA: Diagnosis not present

## 2023-07-06 DIAGNOSIS — F419 Anxiety disorder, unspecified: Secondary | ICD-10-CM

## 2023-07-06 NOTE — Progress Notes (Signed)
Patient ID: Adriana Young, female    DOB: 16-Jul-1960  MRN: [id number]  CC: Chronic Conditions Follow-Up  Subjective: Adriana Young is a 63 y.o. female who presents for chronic conditions follow-up.   Her concerns today include:  - Doing well on Valsartan and Hydrochlorothiazide, no issues/concerns. She does not complain of red flag symptoms such as but not limited to chest pain, shortness of breath, worst headache of life, nausea/vomiting.  - Doing well on Metformin XR, no issues/concerns. Denies red flag symptoms associated with diabetes.  - Doing well on Atorvastatin, no issues/concerns.  - Doing well on Levothyroxine, no issues/concerns. - Doing well on Hydroxyzine, no issues/concerns. She denies thoughts of self-harm, suicidal ideations, homicidal ideations.   Patient Active Problem List   Diagnosis Date Noted   Type 2 diabetes mellitus (HCC) 07/16/2022   Bacterial vaginitis 02/24/2021   Astigmatism of left eye 12/27/2015   Corneal scar, left eye 12/27/2015   Degenerative joint disease (DJD) of hip 10/14/2015   Fatty liver 09/19/2010   Hypothyroidism 06/18/2008   Hyperlipidemia with target LDL less than 100 06/04/2008   Essential hypertension 06/04/2008   GERD 06/04/2008   Generalized OA 06/04/2008     Current Outpatient Medications on File Prior to Visit  Medication Sig Dispense Refill   atorvastatin (LIPITOR) 80 MG tablet Take 1 tablet (80 mg total) by mouth daily. 90 tablet 0   Cholecalciferol ([facility]S D3) 125 MCG (5000 UT) capsule Take 5,000 Units by mouth daily.     cyanocobalamin (VITAMIN B12) 1000 MCG tablet Take 1,000 mcg by mouth daily.     hydrochlorothiazide (HYDRODIURIL) 25 MG tablet Take 1 tablet (25 mg total) by mouth daily. 90 tablet 0   hydrOXYzine (VISTARIL) 25 MG capsule Take 1 capsule (25 mg total) by mouth every 8 (eight) hours as needed. 30 capsule 1   levothyroxine (SYNTHROID) 137 MCG tablet Take 1 tablet (137 mcg total) by mouth daily. 90 tablet 0    Magnesium 250 MG CAPS Take 250 mg by mouth daily.     metFORMIN (GLUCOPHAGE-XR) 500 MG 24 hr tablet Take 1 tablet (500 mg total) by mouth daily with breakfast. 90 tablet 0   Multiple Vitamins-Minerals (CVS SPECTRAVITE PO) Take 1 tablet by mouth daily.     Probiotic Product (PROBIOTIC DAILY PO) Take 1 capsule by mouth daily.     valsartan (DIOVAN) 80 MG tablet Take 1 tablet (80 mg total) by mouth daily. 90 tablet 0   No current facility-administered medications on file prior to visit.    Allergies  Allergen Reactions   Bee Venom Anaphylaxis   Other Hives, Shortness Of Breath, Itching, Swelling and Rash    Bee stings    Crestor [Rosuvastatin]     Myalgias    Poison Ivy Extract Rash    Social History   Socioeconomic History   Marital status: Married    Spouse name: Not on file   Number of children: Not on file   Years of education: Not on file   Highest education level: Associate degree: occupational, Scientist, product/process development, or vocational program  Occupational History   Not on file  Tobacco Use   Smoking status: Former    Current packs/day: 0.00    Average packs/day: 1 pack/day for 25.0 years (25.0 ttl pk-yrs)    Types: Cigarettes    Start date: 05/26/1975    Quit date: 05/25/2000    Years since quitting: 23.1    Passive exposure: Past   Smokeless tobacco: Never  Vaping  Use   Vaping status: Some Days   Start date: 11/26/2018  Substance and Sexual Activity   Alcohol use: No   Drug use: No   Sexual activity: Yes  Other Topics Concern   Not on file  Social History Narrative   Social History      Diet? Just watching- cutting out bread, drinking water      Do you drink/eat things with caffeine? yes      Marital status?        married                            What year were you married? 1980      Do you live in a house, apartment, assisted living, condo, trailer, etc.? house      Is it one or more stories? one      How many persons live in your home? 3      Do you have any pets  in your home? (please list) yes 2 dogs, 3 cats, 3 fish      Highest level of education completed? 2 year college      Current or past profession:      Do you exercise?         yes                             Type & how often? gym      Advanced Directives      Do you have a living will? yes      Do you have a DNR form?                                  If not, do you want to discuss one? no      Do you have signed POA/HPOA for forms? yes      Functional Status      Do you have difficulty bathing or dressing yourself? no      Do you have difficulty preparing food or eating? no      Do you have difficulty managing your medications? no      Do you have difficulty managing your finances? no      Do you have difficulty affording your medications? no   Social Drivers of Corporate investment banker Strain: Low Risk  (06/07/2023)   Overall Financial Resource Strain (CARDIA)    Difficulty of Paying Living Expenses: Not hard at all  Food Insecurity: No Food Insecurity (06/07/2023)   Hunger Vital Sign    Worried About Running Out of Food in the Last Year: Never true    Ran Out of Food in the Last Year: Never true  Transportation Needs: No Transportation Needs (06/07/2023)   PRAPARE - Administrator, Civil Service (Medical): No    Lack of Transportation (Non-Medical): No  Physical Activity: Insufficiently Active (06/07/2023)   Exercise Vital Sign    Days of Exercise per Week: 4 days    Minutes of Exercise per Session: 20 min  Stress: No Stress Concern Present (06/07/2023)   [employer] of Occupational Health - Occupational Stress Questionnaire    Feeling of Stress : Only a little  Social Connections: Unknown (06/07/2023)   Social Connection and Isolation Panel [NHANES]    Frequency of Communication with  Friends and Family: Three times a week    Frequency of Social Gatherings with Friends and Family: Twice a week    Attends Religious Services: Patient declined     Database administrator or Organizations: No    Attends Engineer, structural: Not on file    Marital Status: Married  Catering manager Violence: Not on file    Family History  Problem Relation Age of Onset   Heart disease Mother    Diabetes Mother    Hypertension Mother    Alcohol abuse Other    Arthritis Other    Hyperlipidemia Other    Hypertension Other    Stroke Other        Female 1st degree relative < 60   Diabetes Maternal Grandmother    Colon cancer Neg Hx    Esophageal cancer Neg Hx    Stomach cancer Neg Hx    Rectal cancer Neg Hx     Past Surgical History:  Procedure Laterality Date   BREAST SURGERY     Lumpectomy- left breast, no lymph node involvement    ROS: Review of Systems Negative except as stated above  PHYSICAL EXAM: BP 126/86   Pulse 66   Temp 97.7 F (36.5 C) (Oral)   Ht 5' 8.5" (1.74 m)   Wt 220 lb 12.8 oz (100.2 kg)   LMP 10/25/2014   SpO2 96%   BMI 33.08 kg/m   Physical Exam HENT:     Head: Normocephalic and atraumatic.     Nose: Nose normal.     Mouth/Throat:     Mouth: Mucous membranes are moist.     Pharynx: Oropharynx is clear.  Eyes:     Extraocular Movements: Extraocular movements intact.     Conjunctiva/sclera: Conjunctivae normal.     Pupils: Pupils are equal, round, and reactive to light.  Cardiovascular:     Rate and Rhythm: Normal rate and regular rhythm.     Pulses: Normal pulses.     Heart sounds: Normal heart sounds.  Pulmonary:     Effort: Pulmonary effort is normal.     Breath sounds: Normal breath sounds.  Musculoskeletal:        General: Normal range of motion.     Cervical back: Normal range of motion and neck supple.  Neurological:     General: No focal deficit present.     Mental Status: She is alert and oriented to person, place, and time.  Psychiatric:        Mood and Affect: Mood normal.        Behavior: Behavior normal.     ASSESSMENT AND PLAN: 1. Primary hypertension (Primary) -  Continue Valsartan and Hydrochlorothiazide as prescribed. No refills needed as of present. - Counseled on blood pressure goal of less than 130/80, low-sodium, DASH diet, medication compliance, and 150 minutes of moderate intensity exercise per week as tolerated. Counseled on medication adherence and adverse effects. - Follow-up with primary provider in 3 months or sooner if needed.   2. Type 2 diabetes mellitus with hyperglycemia, without long-term current use of insulin (HCC) - Continue Metformin XR as prescribed. No refills needed as of present. - Hemoglobin A1c result pending.  - Discussed the importance of healthy eating habits, low-carbohydrate diet, low-sugar diet, regular aerobic exercise (at least 150 minutes a week as tolerated) and medication compliance to achieve or maintain control of diabetes. - Follow-up with primary provider as scheduled. - Hemoglobin A1c  3. Diabetic eye exam (HCC) -  Referral to Ophthalmology for evaluation/management. - Ambulatory referral to Ophthalmology  4. Hyperlipidemia, unspecified hyperlipidemia type - Continue Atorvastatin as prescribed. No refills needed as of present.  - Routine screening.  - Follow-up with primary provider as scheduled. - Lipid panel  5. Hypothyroidism, unspecified type - Continue Levothyroxine as prescribed. No refills needed as of present.  - Routine screening.  - Follow-up with primary provider as scheduled. - TSH  6. Anxiety - Patient denies thoughts of self-harm, suicidal ideations, homicidal ideations. - Continue Hydroxyzine as prescribed. No refills needed as of present.  - Follow-up with primary provider in 3 months or sooner if needed.    Patient was given the opportunity to ask questions.  Patient verbalized understanding of the plan and was able to repeat key elements of the plan. Patient was given clear instructions to go to Emergency Department or return to medical center if symptoms don't improve, worsen, or  new problems develop.The patient verbalized understanding.   Orders Placed This Encounter  Procedures   TSH   Lipid panel   Hemoglobin A1c   Ambulatory referral to Ophthalmology     Return in about 3 months (around 10/03/2023) for Follow-Up or next available chronic conditions.  Rema Fendt, NP

## 2023-07-06 NOTE — Progress Notes (Signed)
Patient states no other concerns to discuss.

## 2023-07-07 ENCOUNTER — Encounter: Payer: Self-pay | Admitting: Family

## 2023-07-07 LAB — LIPID PANEL
Chol/HDL Ratio: 3 {ratio} (ref 0.0–4.4)
Cholesterol, Total: 165 mg/dL (ref 100–199)
HDL: 55 mg/dL (ref >39)
LDL Chol Calc (NIH): 87 mg/dL (ref 0–99)
Triglycerides: 129 mg/dL (ref 0–149)
VLDL Cholesterol Cal: 23 mg/dL (ref 5–40)

## 2023-07-07 LAB — HEMOGLOBIN A1C
Est. average glucose Bld gHb Est-mCnc: 131 mg/dL
Hgb A1c MFr Bld: 6.2 % — ABNORMAL HIGH (ref 4.8–5.6)

## 2023-07-07 LAB — TSH: TSH: 1.11 u[IU]/mL (ref 0.450–4.500)

## 2023-07-08 ENCOUNTER — Other Ambulatory Visit: Payer: Self-pay | Admitting: Family

## 2023-07-08 DIAGNOSIS — I10 Essential (primary) hypertension: Secondary | ICD-10-CM

## 2023-07-18 DIAGNOSIS — Z809 Family history of malignant neoplasm, unspecified: Secondary | ICD-10-CM | POA: Diagnosis not present

## 2023-07-18 DIAGNOSIS — Z87891 Personal history of nicotine dependence: Secondary | ICD-10-CM | POA: Diagnosis not present

## 2023-07-18 DIAGNOSIS — E785 Hyperlipidemia, unspecified: Secondary | ICD-10-CM | POA: Diagnosis not present

## 2023-07-18 DIAGNOSIS — E1122 Type 2 diabetes mellitus with diabetic chronic kidney disease: Secondary | ICD-10-CM | POA: Diagnosis not present

## 2023-07-18 DIAGNOSIS — Z8249 Family history of ischemic heart disease and other diseases of the circulatory system: Secondary | ICD-10-CM | POA: Diagnosis not present

## 2023-07-18 DIAGNOSIS — Z833 Family history of diabetes mellitus: Secondary | ICD-10-CM | POA: Diagnosis not present

## 2023-07-18 DIAGNOSIS — R32 Unspecified urinary incontinence: Secondary | ICD-10-CM | POA: Diagnosis not present

## 2023-07-18 DIAGNOSIS — Z823 Family history of stroke: Secondary | ICD-10-CM | POA: Diagnosis not present

## 2023-07-18 DIAGNOSIS — F419 Anxiety disorder, unspecified: Secondary | ICD-10-CM | POA: Diagnosis not present

## 2023-07-18 DIAGNOSIS — Z7984 Long term (current) use of oral hypoglycemic drugs: Secondary | ICD-10-CM | POA: Diagnosis not present

## 2023-07-18 DIAGNOSIS — M1990 Unspecified osteoarthritis, unspecified site: Secondary | ICD-10-CM | POA: Diagnosis not present

## 2023-07-18 DIAGNOSIS — E039 Hypothyroidism, unspecified: Secondary | ICD-10-CM | POA: Diagnosis not present

## 2023-08-16 ENCOUNTER — Other Ambulatory Visit: Payer: Self-pay | Admitting: Family

## 2023-08-16 DIAGNOSIS — E039 Hypothyroidism, unspecified: Secondary | ICD-10-CM

## 2023-08-16 NOTE — Telephone Encounter (Signed)
 Complete

## 2023-08-16 NOTE — Telephone Encounter (Signed)
Ok for 90 days

## 2023-08-18 DIAGNOSIS — M9901 Segmental and somatic dysfunction of cervical region: Secondary | ICD-10-CM | POA: Diagnosis not present

## 2023-08-18 DIAGNOSIS — M9903 Segmental and somatic dysfunction of lumbar region: Secondary | ICD-10-CM | POA: Diagnosis not present

## 2023-08-18 DIAGNOSIS — M9904 Segmental and somatic dysfunction of sacral region: Secondary | ICD-10-CM | POA: Diagnosis not present

## 2023-08-18 DIAGNOSIS — M9905 Segmental and somatic dysfunction of pelvic region: Secondary | ICD-10-CM | POA: Diagnosis not present

## 2023-08-20 ENCOUNTER — Other Ambulatory Visit: Payer: Self-pay | Admitting: Nephrology

## 2023-08-20 DIAGNOSIS — N1831 Chronic kidney disease, stage 3a: Secondary | ICD-10-CM

## 2023-08-20 DIAGNOSIS — R350 Frequency of micturition: Secondary | ICD-10-CM | POA: Diagnosis not present

## 2023-08-20 DIAGNOSIS — I129 Hypertensive chronic kidney disease with stage 1 through stage 4 chronic kidney disease, or unspecified chronic kidney disease: Secondary | ICD-10-CM | POA: Diagnosis not present

## 2023-08-20 DIAGNOSIS — E1122 Type 2 diabetes mellitus with diabetic chronic kidney disease: Secondary | ICD-10-CM | POA: Diagnosis not present

## 2023-08-25 ENCOUNTER — Ambulatory Visit
Admission: RE | Admit: 2023-08-25 | Discharge: 2023-08-25 | Disposition: A | Discharge status: Home / Self Care | Source: Ambulatory Visit | Attending: Nephrology | Admitting: Nephrology

## 2023-08-25 DIAGNOSIS — N1831 Chronic kidney disease, stage 3a: Secondary | ICD-10-CM

## 2023-08-25 DIAGNOSIS — N200 Calculus of kidney: Secondary | ICD-10-CM | POA: Diagnosis not present

## 2023-08-25 DIAGNOSIS — N183 Chronic kidney disease, stage 3 unspecified: Secondary | ICD-10-CM | POA: Diagnosis not present

## 2023-08-31 ENCOUNTER — Other Ambulatory Visit: Payer: Self-pay | Admitting: Family

## 2023-08-31 DIAGNOSIS — E785 Hyperlipidemia, unspecified: Secondary | ICD-10-CM

## 2023-09-01 NOTE — Telephone Encounter (Signed)
 Complete

## 2023-09-03 DIAGNOSIS — N1831 Chronic kidney disease, stage 3a: Secondary | ICD-10-CM | POA: Diagnosis not present

## 2023-09-07 ENCOUNTER — Ambulatory Visit (INDEPENDENT_AMBULATORY_CARE_PROVIDER_SITE_OTHER): Payer: Self-pay | Admitting: Family

## 2023-09-07 ENCOUNTER — Encounter: Payer: Self-pay | Admitting: Family

## 2023-09-07 VITALS — BP 131/85 | HR 73 | Temp 98.7°F | Resp 16 | Ht 68.0 in | Wt 226.8 lb

## 2023-09-07 DIAGNOSIS — I10 Essential (primary) hypertension: Secondary | ICD-10-CM

## 2023-09-07 MED ORDER — VALSARTAN 160 MG PO TABS: 160.0000 mg | ORAL_TABLET | Freq: Every day | ORAL | 0 refills | Status: DC

## 2023-09-07 NOTE — Progress Notes (Signed)
 Patient ID: Adriana Young, female    DOB: 02-13-1961  MRN: [id number]  CC: Chronic Conditions Follow-Up  Subjective: [name] is a 63 y.o. female who presents for chronic conditions follow-up.   Her concerns today include:  - States Nephrology discontinued Hydrochlorothiazide and increased Valsartan from 80 mg to 160 mg. Reports initially blood pressures were high but has improved since then. Reports she is taking ginger root to help with blood pressure. She does not complain of red flag symptoms such as but not limited to chest pain, shortness of breath, worst headache of life, nausea/vomiting.  - Established with Chiropractor.  Patient Active Problem List   Diagnosis Date Noted   Type 2 diabetes mellitus (HCC) 07/16/2022   Bacterial vaginitis 02/24/2021   Astigmatism of left eye 12/27/2015   Corneal scar, left eye 12/27/2015   Degenerative joint disease (DJD) of hip 10/14/2015   Fatty liver 09/19/2010   Hypothyroidism 06/18/2008   Hyperlipidemia with target LDL less than 100 06/04/2008   Essential hypertension 06/04/2008   GERD 06/04/2008   Generalized OA 06/04/2008     Current Outpatient Medications on File Prior to Visit  Medication Sig Dispense Refill   atorvastatin (LIPITOR) 80 MG tablet TAKE 1 TABLET BY MOUTH EVERY DAY 90 tablet 0   cyanocobalamin (VITAMIN B12) 1000 MCG tablet Take 1,000 mcg by mouth daily.     Ginger, Zingiber officinalis, (GINGER ROOT) 550 MG CAPS Take 550 capsules by mouth daily.     hydrOXYzine (VISTARIL) 25 MG capsule Take 1 capsule (25 mg total) by mouth every 8 (eight) hours as needed. 30 capsule 1   levothyroxine (SYNTHROID) 137 MCG tablet TAKE 1 TABLET BY MOUTH EVERY DAY 90 tablet 0   Magnesium 250 MG CAPS Take 250 mg by mouth daily.     metFORMIN (GLUCOPHAGE-XR) 500 MG 24 hr tablet Take 1 tablet (500 mg total) by mouth daily with breakfast. 90 tablet 0   Probiotic Product (PROBIOTIC DAILY PO) Take 1 capsule by mouth daily.     Cholecalciferol  ([facility] D3) 125 MCG (5000 UT) capsule Take 5,000 Units by mouth daily. (Patient not taking: Reported on 09/07/2023)     hydrochlorothiazide (HYDRODIURIL) 25 MG tablet Take 1 tablet (25 mg total) by mouth daily. 90 tablet 0   Multiple Vitamins-Minerals (CVS SPECTRAVITE PO) Take 1 tablet by mouth daily. (Patient not taking: Reported on 09/07/2023)     No current facility-administered medications on file prior to visit.    Allergies  Allergen Reactions   Bee Venom Anaphylaxis   Other Hives, Shortness Of Breath, Itching, Swelling and Rash    Bee stings    Crestor [Rosuvastatin]     Myalgias    Poison Ivy Extract Rash    Social History   Socioeconomic History   Marital status: Married    Spouse name: Not on file   Number of children: Not on file   Years of education: Not on file   Highest education level: Associate degree: occupational, Scientist, product/process development, or vocational program  Occupational History   Not on file  Tobacco Use   Smoking status: Former    Current packs/day: 0.00    Average packs/day: 1 pack/day for 25.0 years (25.0 ttl pk-yrs)    Types: Cigarettes    Start date: 05/26/1975    Quit date: 05/25/2000    Years since quitting: 23.3    Passive exposure: Past   Smokeless tobacco: Never  Vaping Use   Vaping status: Some Days   Start  date: 11/26/2018  Substance and Sexual Activity   Alcohol use: No   Drug use: No   Sexual activity: Yes  Other Topics Concern   Not on file  Social History Narrative   Social History      Diet? Just watching- cutting out bread, drinking water      Do you drink/eat things with caffeine? yes      Marital status?        married                            What year were you married? 1980      Do you live in a house, apartment, assisted living, condo, trailer, etc.? house      Is it one or more stories? one      How many persons live in your home? 3      Do you have any pets in your home? (please list) yes 2 dogs, 3 cats, 3 fish      Highest level  of education completed? 2 year college      Current or past profession:      Do you exercise?         yes                             Type & how often? gym      Advanced Directives      Do you have a living will? yes      Do you have a DNR form?                                  If not, do you want to discuss one? no      Do you have signed POA/HPOA for forms? yes      Functional Status      Do you have difficulty bathing or dressing yourself? no      Do you have difficulty preparing food or eating? no      Do you have difficulty managing your medications? no      Do you have difficulty managing your finances? no      Do you have difficulty affording your medications? no   Social Drivers of Corporate investment banker Strain: Low Risk  (06/07/2023)   Overall Financial Resource Strain (CARDIA)    Difficulty of Paying Living Expenses: Not hard at all  Food Insecurity: No Food Insecurity (06/07/2023)   Hunger Vital Sign    Worried About Running Out of Food in the Last Year: Never true    Ran Out of Food in the Last Year: Never true  Transportation Needs: No Transportation Needs (06/07/2023)   PRAPARE - Administrator, Civil Service (Medical): No    Lack of Transportation (Non-Medical): No  Physical Activity: Insufficiently Active (06/07/2023)   Exercise Vital Sign    Days of Exercise per Week: 4 days    Minutes of Exercise per Session: 20 min  Stress: No Stress Concern Present (06/07/2023)   [employer] of Occupational Health - Occupational Stress Questionnaire    Feeling of Stress : Only a little  Social Connections: Moderately Integrated (09/07/2023)   Social Connection and Isolation Panel [NHANES]    Frequency of Communication with Friends and Family: Twice a week  Frequency of Social Gatherings with Friends and Family: Twice a week    Attends Religious Services: More than 4 times per year    Active Member of [employer] or Organizations: No    Attends Occupational hygienist Meetings: Never    Marital Status: Married  Catering manager Violence: Not At Risk (09/07/2023)   Humiliation, Afraid, Rape, and Kick questionnaire    Fear of Current or Ex-Partner: No    Emotionally Abused: No    Physically Abused: No    Sexually Abused: No    Family History  Problem Relation Age of Onset   Heart disease Mother    Diabetes Mother    Hypertension Mother    Alcohol abuse Other    Arthritis Other    Hyperlipidemia Other    Hypertension Other    Stroke Other        Female 1st degree relative < 60   Diabetes Maternal Grandmother    Colon cancer Neg Hx    Esophageal cancer Neg Hx    Stomach cancer Neg Hx    Rectal cancer Neg Hx     Past Surgical History:  Procedure Laterality Date   BREAST SURGERY     Lumpectomy- left breast, no lymph node involvement    ROS: Review of Systems Negative except as stated above  PHYSICAL EXAM: BP 131/85   Pulse 73   Temp 98.7 F (37.1 C) (Oral)   Resp 16   Ht 5\' 8"  (1.727 m)   Wt 226 lb 12.8 oz (102.9 kg)   LMP 10/25/2014   SpO2 97%   BMI 34.48 kg/m   Physical Exam HENT:     Head: Normocephalic and atraumatic.     Nose: Nose normal.     Mouth/Throat:     Mouth: Mucous membranes are moist.     Pharynx: Oropharynx is clear.  Eyes:     Extraocular Movements: Extraocular movements intact.     Conjunctiva/sclera: Conjunctivae normal.     Pupils: Pupils are equal, round, and reactive to light.  Cardiovascular:     Rate and Rhythm: Normal rate and regular rhythm.     Pulses: Normal pulses.     Heart sounds: Normal heart sounds.  Pulmonary:     Effort: Pulmonary effort is normal.     Breath sounds: Normal breath sounds.  Musculoskeletal:        General: Normal range of motion.     Cervical back: Normal range of motion and neck supple.  Neurological:     General: No focal deficit present.     Mental Status: She is alert and oriented to person, place, and time.  Psychiatric:        Mood and  Affect: Mood normal.        Behavior: Behavior normal.     ASSESSMENT AND PLAN: 1. Primary hypertension (Primary) - Continue Valsartan as prescribed.  - Routine screening.  - Counseled on blood pressure goal of less than 130/80, low-sodium, DASH diet, medication compliance, and 150 minutes of moderate intensity exercise per week as tolerated. Counseled on medication adherence and adverse effects. - Follow-up with primary provider in 3 months or sooner if needed. - valsartan (DIOVAN) 160 MG tablet; Take 1 tablet (160 mg total) by mouth daily.  Dispense: 90 tablet; Refill: 0 - Basic Metabolic Panel    Patient was given the opportunity to ask questions.  Patient verbalized understanding of the plan and was able to repeat key elements of the plan. Patient  was given clear instructions to go to Emergency Department or return to medical center if symptoms don't improve, worsen, or new problems develop.The patient verbalized understanding.   Orders Placed This Encounter  Procedures   Basic Metabolic Panel     Requested Prescriptions   Signed Prescriptions Disp Refills   valsartan (DIOVAN) 160 MG tablet 90 tablet 0    Sig: Take 1 tablet (160 mg total) by mouth daily.    Return in about 3 months (around 12/07/2023) for Follow-Up or next available chronic conditions.  Senaida Dama, NP

## 2023-09-07 NOTE — Progress Notes (Signed)
 3 month follow up.  Went to the kidney MD and he took her off of hydrochlorothiazide and her  b/p went up high. He double up on her Valsartan.  He took her off of her vitamin D and multi vitamin.  Patient has a kidney stone.

## 2023-09-08 ENCOUNTER — Encounter: Payer: Self-pay | Admitting: Family

## 2023-09-08 LAB — BASIC METABOLIC PANEL WITH GFR
BUN/Creatinine Ratio: 18 (ref 12–28)
BUN: 19 mg/dL (ref 8–27)
CO2: 21 mmol/L (ref 20–29)
Calcium: 9.6 mg/dL (ref 8.7–10.3)
Chloride: 104 mmol/L (ref 96–106)
Creatinine, Ser: 1.06 mg/dL — ABNORMAL HIGH (ref 0.57–1.00)
Glucose: 123 mg/dL — ABNORMAL HIGH (ref 70–99)
Potassium: 4.3 mmol/L (ref 3.5–5.2)
Sodium: 140 mmol/L (ref 134–144)
eGFR: 59 mL/min/{1.73_m2} — ABNORMAL LOW (ref >59)

## 2023-09-15 DIAGNOSIS — M9903 Segmental and somatic dysfunction of lumbar region: Secondary | ICD-10-CM | POA: Diagnosis not present

## 2023-09-15 DIAGNOSIS — M9901 Segmental and somatic dysfunction of cervical region: Secondary | ICD-10-CM | POA: Diagnosis not present

## 2023-09-15 DIAGNOSIS — M9904 Segmental and somatic dysfunction of sacral region: Secondary | ICD-10-CM | POA: Diagnosis not present

## 2023-09-15 DIAGNOSIS — M9905 Segmental and somatic dysfunction of pelvic region: Secondary | ICD-10-CM | POA: Diagnosis not present

## 2023-10-04 ENCOUNTER — Ambulatory Visit: Payer: 59 | Admitting: Family

## 2023-10-13 DIAGNOSIS — M9901 Segmental and somatic dysfunction of cervical region: Secondary | ICD-10-CM | POA: Diagnosis not present

## 2023-10-13 DIAGNOSIS — M9905 Segmental and somatic dysfunction of pelvic region: Secondary | ICD-10-CM | POA: Diagnosis not present

## 2023-10-13 DIAGNOSIS — M9904 Segmental and somatic dysfunction of sacral region: Secondary | ICD-10-CM | POA: Diagnosis not present

## 2023-10-13 DIAGNOSIS — M9903 Segmental and somatic dysfunction of lumbar region: Secondary | ICD-10-CM | POA: Diagnosis not present

## 2023-11-10 DIAGNOSIS — M9903 Segmental and somatic dysfunction of lumbar region: Secondary | ICD-10-CM | POA: Diagnosis not present

## 2023-11-10 DIAGNOSIS — M9905 Segmental and somatic dysfunction of pelvic region: Secondary | ICD-10-CM | POA: Diagnosis not present

## 2023-11-10 DIAGNOSIS — M62838 Other muscle spasm: Secondary | ICD-10-CM | POA: Diagnosis not present

## 2023-11-10 DIAGNOSIS — M9904 Segmental and somatic dysfunction of sacral region: Secondary | ICD-10-CM | POA: Diagnosis not present

## 2023-11-27 ENCOUNTER — Other Ambulatory Visit: Payer: Self-pay | Admitting: Family

## 2023-11-27 DIAGNOSIS — E039 Hypothyroidism, unspecified: Secondary | ICD-10-CM

## 2023-11-29 NOTE — Telephone Encounter (Signed)
 Complete

## 2023-12-07 ENCOUNTER — Encounter: Payer: Self-pay | Admitting: Family

## 2023-12-07 ENCOUNTER — Ambulatory Visit (INDEPENDENT_AMBULATORY_CARE_PROVIDER_SITE_OTHER): Admitting: Family

## 2023-12-07 VITALS — BP 136/85 | HR 75 | Temp 98.9°F | Resp 16 | Ht 69.0 in | Wt 226.6 lb

## 2023-12-07 DIAGNOSIS — F419 Anxiety disorder, unspecified: Secondary | ICD-10-CM

## 2023-12-07 DIAGNOSIS — E039 Hypothyroidism, unspecified: Secondary | ICD-10-CM | POA: Diagnosis not present

## 2023-12-07 DIAGNOSIS — W57XXXA Bitten or stung by nonvenomous insect and other nonvenomous arthropods, initial encounter: Secondary | ICD-10-CM | POA: Diagnosis not present

## 2023-12-07 DIAGNOSIS — S90862A Insect bite (nonvenomous), left foot, initial encounter: Secondary | ICD-10-CM

## 2023-12-07 DIAGNOSIS — E785 Hyperlipidemia, unspecified: Secondary | ICD-10-CM

## 2023-12-07 DIAGNOSIS — I10 Essential (primary) hypertension: Secondary | ICD-10-CM | POA: Diagnosis not present

## 2023-12-07 DIAGNOSIS — Z7984 Long term (current) use of oral hypoglycemic drugs: Secondary | ICD-10-CM

## 2023-12-07 DIAGNOSIS — E1165 Type 2 diabetes mellitus with hyperglycemia: Secondary | ICD-10-CM

## 2023-12-07 MED ORDER — ATORVASTATIN CALCIUM 80 MG PO TABS: 80.0000 mg | ORAL_TABLET | Freq: Every day | ORAL | 2 refills | Status: DC

## 2023-12-07 MED ORDER — VALSARTAN 160 MG PO TABS: 160.0000 mg | ORAL_TABLET | Freq: Every day | ORAL | 2 refills | Status: DC

## 2023-12-07 MED ORDER — HYDROXYZINE PAMOATE 25 MG PO CAPS: 25.0000 mg | ORAL_CAPSULE | Freq: Three times a day (TID) | ORAL | 2 refills | Status: DC | PRN

## 2023-12-07 MED ORDER — LEVOTHYROXINE SODIUM 137 MCG PO TABS: 137.0000 ug | ORAL_TABLET | Freq: Every day | ORAL | 2 refills | Status: DC

## 2023-12-07 MED ORDER — DOXYCYCLINE HYCLATE 100 MG PO TABS: 200.0000 mg | ORAL_TABLET | Freq: Once | ORAL | 0 refills | Status: AC

## 2023-12-07 MED ORDER — HYDROCHLOROTHIAZIDE 25 MG PO TABS: 25.0000 mg | ORAL_TABLET | Freq: Every day | ORAL | 2 refills | Status: DC

## 2023-12-07 MED ORDER — METFORMIN HCL ER 500 MG PO TB24: 500.0000 mg | ORAL_TABLET | Freq: Every day | ORAL | 2 refills | Status: DC

## 2023-12-07 NOTE — Progress Notes (Signed)
 Patient ID: Adriana Young, female    DOB: January 16, 1961  MRN: [id number]  CC: Chronic Conditions Follow-Up  Subjective: Adriana Young is a 63 y.o. female who presents for chronic conditions follow-up.   Her concerns today include:  - Doing well on Valsartan  and Hydrochlorothiazide , no issues/concerns. She does not complain of red flag symptoms such as but not limited to chest pain, shortness of breath, worst headache of life, nausea/vomiting.  - Doing well on Metformin  XR, no issues/concerns. Denies red flag symptoms associated with diabetes.  - Doing well on Atorvastatin , no issues/concerns.  - Doing well on Levothyroxine , no issues/concerns.  - Doing well on Hydroxyzine , no issues/concerns. She denies thoughts of self-harm, suicidal ideations, homicidal ideations. - States she was bitten by a tick on toe of left foot. Denies red flag symptoms. States she removed the tick herself.  Patient Active Problem List   Diagnosis Date Noted   Type 2 diabetes mellitus (HCC) 07/16/2022   Bacterial vaginitis 02/24/2021   Astigmatism of left eye 12/27/2015   Corneal scar, left eye 12/27/2015   Degenerative joint disease (DJD) of hip 10/14/2015   Fatty liver 09/19/2010   Hypothyroidism 06/18/2008   Hyperlipidemia with target LDL less than 100 06/04/2008   Essential hypertension 06/04/2008   GERD 06/04/2008   Generalized OA 06/04/2008     Current Outpatient Medications on File Prior to Visit  Medication Sig Dispense Refill   cyanocobalamin  (VITAMIN B12) 1000 MCG tablet Take 1,000 mcg by mouth daily.     Ginger, Zingiber officinalis, (GINGER ROOT) 550 MG CAPS Take 550 capsules by mouth daily.     Magnesium 250 MG CAPS Take 250 mg by mouth daily.     Probiotic Product (PROBIOTIC DAILY PO) Take 1 capsule by mouth daily.     Cholecalciferol (CVS D3) 125 MCG (5000 UT) capsule Take 5,000 Units by mouth daily. (Patient not taking: Reported on 09/07/2023)     Multiple Vitamins-Minerals (CVS SPECTRAVITE PO)  Take 1 tablet by mouth daily. (Patient not taking: Reported on 09/07/2023)     No current facility-administered medications on file prior to visit.    Allergies  Allergen Reactions   Bee Venom Anaphylaxis   Other Hives, Shortness Of Breath, Itching, Swelling and Rash    Bee stings    Crestor  [Rosuvastatin ]     Myalgias    Poison Ivy Extract Rash    Social History   Socioeconomic History   Marital status: Married    Spouse name: Not on file   Number of children: Not on file   Years of education: Not on file   Highest education level: Associate degree: occupational, Scientist, product/process development, or vocational program  Occupational History   Not on file  Tobacco Use   Smoking status: Former    Current packs/day: 0.00    Average packs/day: 1 pack/day for 25.0 years (25.0 ttl pk-yrs)    Types: Cigarettes    Start date: 05/26/1975    Quit date: 05/25/2000    Years since quitting: 23.5    Passive exposure: Past   Smokeless tobacco: Never  Vaping Use   Vaping status: Some Days   Start date: 11/26/2018  Substance and Sexual Activity   Alcohol use: No   Drug use: No   Sexual activity: Yes  Other Topics Concern   Not on file  Social History Narrative   Social History      Diet? Just watching- cutting out bread, drinking water      Do you drink/eat  things with caffeine? yes      Marital status?        married                            What year were you married? 1980      Do you live in a house, apartment, assisted living, condo, trailer, etc.? house      Is it one or more stories? one      How many persons live in your home? 3      Do you have any pets in your home? (please list) yes 2 dogs, 3 cats, 3 fish      Highest level of education completed? 2 year college      Current or past profession:      Do you exercise?         yes                             Type & how often? gym      Advanced Directives      Do you have a living will? yes      Do you have a DNR form?                                   If not, do you want to discuss one? no      Do you have signed POA/HPOA for forms? yes      Functional Status      Do you have difficulty bathing or dressing yourself? no      Do you have difficulty preparing food or eating? no      Do you have difficulty managing your medications? no      Do you have difficulty managing your finances? no      Do you have difficulty affording your medications? no   Social Drivers of Corporate investment banker Strain: Low Risk  (11/30/2023)   Overall Financial Resource Strain (CARDIA)    Difficulty of Paying Living Expenses: Not hard at all  Food Insecurity: No Food Insecurity (11/30/2023)   Hunger Vital Sign    Worried About Running Out of Food in the Last Year: Never true    Ran Out of Food in the Last Year: Never true  Transportation Needs: No Transportation Needs (11/30/2023)   PRAPARE - Administrator, Civil Service (Medical): No    Lack of Transportation (Non-Medical): No  Physical Activity: Insufficiently Active (11/30/2023)   Exercise Vital Sign    Days of Exercise per Week: 3 days    Minutes of Exercise per Session: 20 min  Stress: No Stress Concern Present (11/30/2023)   [employer] of Occupational Health - Occupational Stress Questionnaire    Feeling of Stress: Only a little  Social Connections: Moderately Isolated (11/30/2023)   Social Connection and Isolation Panel    Frequency of Communication with Friends and Family: Twice a week    Frequency of Social Gatherings with Friends and Family: Once a week    Attends Religious Services: Never    Database administrator or Organizations: No    Attends Banker Meetings: Not on file    Marital Status: Married  Intimate Partner Violence: Not At Risk (09/07/2023)   Humiliation, Afraid, Rape, and Kick questionnaire  Fear of Current or Ex-Partner: No    Emotionally Abused: No    Physically Abused: No    Sexually Abused: No    Family History   Problem Relation Age of Onset   Heart disease Mother    Diabetes Mother    Hypertension Mother    Alcohol abuse Other    Arthritis Other    Hyperlipidemia Other    Hypertension Other    Stroke Other        Female 1st degree relative < 60   Diabetes Maternal Grandmother    Colon cancer Neg Hx    Esophageal cancer Neg Hx    Stomach cancer Neg Hx    Rectal cancer Neg Hx     Past Surgical History:  Procedure Laterality Date   BREAST SURGERY     Lumpectomy- left breast, no lymph node involvement    ROS: Review of Systems Negative except as stated above  PHYSICAL EXAM: BP 136/85   Pulse 75   Temp 98.9 F (37.2 C) (Oral)   Resp 16   Ht 5' 9 (1.753 m)   Wt 226 lb 9.6 oz (102.8 kg)   LMP 10/25/2014   SpO2 96%   BMI 33.46 kg/m   Physical Exam HENT:     Head: Normocephalic and atraumatic.     Nose: Nose normal.     Mouth/Throat:     Mouth: Mucous membranes are moist.     Pharynx: Oropharynx is clear.  Eyes:     Extraocular Movements: Extraocular movements intact.     Conjunctiva/sclera: Conjunctivae normal.     Pupils: Pupils are equal, round, and reactive to light.  Cardiovascular:     Rate and Rhythm: Normal rate and regular rhythm.     Pulses: Normal pulses.     Heart sounds: Normal heart sounds.  Pulmonary:     Effort: Pulmonary effort is normal.     Breath sounds: Normal breath sounds.  Musculoskeletal:        General: Normal range of motion.     Cervical back: Normal range of motion and neck supple.  Feet:     Comments: Small erythematous area of toe of left foot, no drainage. Neurological:     General: No focal deficit present.     Mental Status: She is alert and oriented to person, place, and time.  Psychiatric:        Mood and Affect: Mood normal.        Behavior: Behavior normal.     ASSESSMENT AND PLAN: 1. Primary hypertension (Primary) - Continue Valsartan  and Hydrochlorothiazide  as prescribed.  - Counseled on blood pressure goal of less  than 130/80, low-sodium, DASH diet, medication compliance, and 150 minutes of moderate intensity exercise per week as tolerated. Counseled on medication adherence and adverse effects. - Follow-up with primary provider in 3 months or sooner if needed.  - hydrochlorothiazide  (HYDRODIURIL ) 25 MG tablet; Take 1 tablet (25 mg total) by mouth daily.  Dispense: 30 tablet; Refill: 2 - valsartan  (DIOVAN ) 160 MG tablet; Take 1 tablet (160 mg total) by mouth daily.  Dispense: 30 tablet; Refill: 2  2. Type 2 diabetes mellitus with hyperglycemia, without long-term current use of insulin (HCC) - Continue Metformin  XR as prescribed.  - Hemoglobin A1c result pending.  - Routine screening. - Discussed the importance of healthy eating habits, low-carbohydrate diet, low-sugar diet, regular aerobic exercise (at least 150 minutes a week as tolerated) and medication compliance to achieve or maintain control of diabetes.  Counseled on medication adherence/adverse effects.  - Follow-up with primary provider as scheduled. - metFORMIN  (GLUCOPHAGE -XR) 500 MG 24 hr tablet; Take 1 tablet (500 mg total) by mouth daily with breakfast.  Dispense: 30 tablet; Refill: 2 - Hemoglobin A1c - Microalbumin / creatinine urine ratio  3. Hyperlipidemia, unspecified hyperlipidemia type - Continue Atorvastatin  as prescribed. Counseled on medication adherence/adverse effects.  - Follow-up with primary provider in 3 months or sooner if needed. - atorvastatin  (LIPITOR) 80 MG tablet; Take 1 tablet (80 mg total) by mouth daily.  Dispense: 30 tablet; Refill: 2  4. Hypothyroidism, unspecified type - Continue Levothyroxine  as prescribed. Counseled on medication adherence/adverse effects.  - Follow-up with primary provider in 3 months or sooner if needed. - levothyroxine  (SYNTHROID ) 137 MCG tablet; Take 1 tablet (137 mcg total) by mouth daily.  Dispense: 30 tablet; Refill: 2  5. Anxiety - Patient denies thoughts of self-harm, suicidal  ideations, homicidal ideations. - Continue Hydroxyzine  as prescribed. Counseled on medication adherence/adverse effects.  - Follow-up with primary provider in 3 months or sooner if needed. - hydrOXYzine  (VISTARIL ) 25 MG capsule; Take 1 capsule (25 mg total) by mouth every 8 (eight) hours as needed.  Dispense: 30 capsule; Refill: 2  6. Tick bite of left foot, initial encounter - Doxycyline as prescribed for empiric therapy. Counseled on medication adherence/adverse effects.  - Routine screening.  - Follow-up with primary provider as scheduled. - doxycycline  (VIBRA -TABS) 100 MG tablet; Take 2 tablets (200 mg total) by mouth once for 1 dose.  Dispense: 2 tablet; Refill: 0 - Lyme Disease Serology w/Reflex    Patient was given the opportunity to ask questions.  Patient verbalized understanding of the plan and was able to repeat key elements of the plan. Patient was given clear instructions to go to Emergency Department or return to medical center if symptoms don't improve, worsen, or new problems develop.The patient verbalized understanding.   Orders Placed This Encounter  Procedures   Lyme Disease Serology w/Reflex   Hemoglobin A1c   Microalbumin / creatinine urine ratio     Requested Prescriptions   Signed Prescriptions Disp Refills   atorvastatin  (LIPITOR) 80 MG tablet 30 tablet 2    Sig: Take 1 tablet (80 mg total) by mouth daily.   hydrochlorothiazide  (HYDRODIURIL ) 25 MG tablet 30 tablet 2    Sig: Take 1 tablet (25 mg total) by mouth daily.   hydrOXYzine  (VISTARIL ) 25 MG capsule 30 capsule 2    Sig: Take 1 capsule (25 mg total) by mouth every 8 (eight) hours as needed.   levothyroxine  (SYNTHROID ) 137 MCG tablet 30 tablet 2    Sig: Take 1 tablet (137 mcg total) by mouth daily.   metFORMIN  (GLUCOPHAGE -XR) 500 MG 24 hr tablet 30 tablet 2    Sig: Take 1 tablet (500 mg total) by mouth daily with breakfast.   valsartan  (DIOVAN ) 160 MG tablet 30 tablet 2    Sig: Take 1 tablet (160 mg  total) by mouth daily.   doxycycline  (VIBRA -TABS) 100 MG tablet 2 tablet 0    Sig: Take 2 tablets (200 mg total) by mouth once for 1 dose.    Follow-up with primary provider as scheduled.  Greig JINNY Drones, NP

## 2023-12-07 NOTE — Progress Notes (Signed)
 3 month follow up, medication refill

## 2023-12-08 ENCOUNTER — Ambulatory Visit: Payer: Self-pay | Admitting: Family

## 2023-12-08 DIAGNOSIS — M9903 Segmental and somatic dysfunction of lumbar region: Secondary | ICD-10-CM | POA: Diagnosis not present

## 2023-12-08 DIAGNOSIS — M9904 Segmental and somatic dysfunction of sacral region: Secondary | ICD-10-CM | POA: Diagnosis not present

## 2023-12-08 DIAGNOSIS — M62838 Other muscle spasm: Secondary | ICD-10-CM | POA: Diagnosis not present

## 2023-12-08 DIAGNOSIS — M9905 Segmental and somatic dysfunction of pelvic region: Secondary | ICD-10-CM | POA: Diagnosis not present

## 2023-12-08 LAB — MICROALBUMIN / CREATININE URINE RATIO
Creatinine, Urine: 162.1 mg/dL
Microalb/Creat Ratio: 9 mg/g{creat} (ref 0–29)
Microalbumin, Urine: 14.9 ug/mL

## 2023-12-08 LAB — HEMOGLOBIN A1C
Est. average glucose Bld gHb Est-mCnc: 137 mg/dL
Hgb A1c MFr Bld: 6.4 % — ABNORMAL HIGH (ref 4.8–5.6)

## 2023-12-08 LAB — LYME DISEASE SEROLOGY W/REFLEX: Lyme Total Antibody EIA: NEGATIVE

## 2024-02-09 DIAGNOSIS — N1831 Chronic kidney disease, stage 3a: Secondary | ICD-10-CM | POA: Diagnosis not present

## 2024-02-09 DIAGNOSIS — R350 Frequency of micturition: Secondary | ICD-10-CM | POA: Diagnosis not present

## 2024-02-09 DIAGNOSIS — E1122 Type 2 diabetes mellitus with diabetic chronic kidney disease: Secondary | ICD-10-CM | POA: Diagnosis not present

## 2024-02-09 DIAGNOSIS — I129 Hypertensive chronic kidney disease with stage 1 through stage 4 chronic kidney disease, or unspecified chronic kidney disease: Secondary | ICD-10-CM | POA: Diagnosis not present

## 2024-02-09 LAB — LAB REPORT - SCANNED: EGFR: 64

## 2024-02-15 ENCOUNTER — Ambulatory Visit: Payer: Self-pay | Admitting: Family

## 2024-02-27 ENCOUNTER — Other Ambulatory Visit: Payer: Self-pay | Admitting: Family

## 2024-02-27 DIAGNOSIS — E1165 Type 2 diabetes mellitus with hyperglycemia: Secondary | ICD-10-CM

## 2024-03-08 DIAGNOSIS — M9904 Segmental and somatic dysfunction of sacral region: Secondary | ICD-10-CM | POA: Diagnosis not present

## 2024-03-08 DIAGNOSIS — M9902 Segmental and somatic dysfunction of thoracic region: Secondary | ICD-10-CM | POA: Diagnosis not present

## 2024-03-08 DIAGNOSIS — M9903 Segmental and somatic dysfunction of lumbar region: Secondary | ICD-10-CM | POA: Diagnosis not present

## 2024-03-08 DIAGNOSIS — M9905 Segmental and somatic dysfunction of pelvic region: Secondary | ICD-10-CM | POA: Diagnosis not present

## 2024-03-09 ENCOUNTER — Ambulatory Visit (INDEPENDENT_AMBULATORY_CARE_PROVIDER_SITE_OTHER): Admitting: Family

## 2024-03-09 ENCOUNTER — Encounter: Payer: Self-pay | Admitting: Family

## 2024-03-09 VITALS — BP 117/80 | HR 70 | Temp 98.1°F | Resp 16 | Ht 69.0 in | Wt 223.6 lb

## 2024-03-09 DIAGNOSIS — I10 Essential (primary) hypertension: Secondary | ICD-10-CM | POA: Diagnosis not present

## 2024-03-09 DIAGNOSIS — E785 Hyperlipidemia, unspecified: Secondary | ICD-10-CM

## 2024-03-09 DIAGNOSIS — F419 Anxiety disorder, unspecified: Secondary | ICD-10-CM | POA: Diagnosis not present

## 2024-03-09 DIAGNOSIS — E039 Hypothyroidism, unspecified: Secondary | ICD-10-CM | POA: Diagnosis not present

## 2024-03-09 MED ORDER — LEVOTHYROXINE SODIUM 137 MCG PO TABS: 137.0000 ug | ORAL_TABLET | Freq: Every day | ORAL | 2 refills | Status: AC

## 2024-03-09 MED ORDER — HYDROCHLOROTHIAZIDE 25 MG PO TABS: 25.0000 mg | ORAL_TABLET | Freq: Every day | ORAL | 2 refills | Status: AC

## 2024-03-09 MED ORDER — HYDROXYZINE PAMOATE 25 MG PO CAPS
25.0000 mg | ORAL_CAPSULE | Freq: Three times a day (TID) | ORAL | 2 refills | Status: AC | PRN
Start: 2024-03-09 — End: ?

## 2024-03-09 MED ORDER — ATORVASTATIN CALCIUM 80 MG PO TABS: 80.0000 mg | ORAL_TABLET | Freq: Every day | ORAL | 2 refills | Status: DC

## 2024-03-09 MED ORDER — VALSARTAN 160 MG PO TABS: 160.0000 mg | ORAL_TABLET | Freq: Every day | ORAL | 2 refills | Status: AC

## 2024-03-09 NOTE — Progress Notes (Signed)
 Follow up.

## 2024-03-09 NOTE — Progress Notes (Signed)
 Patient ID: Adriana Young, female    DOB: [birth year]  MRN: [id number]  CC: Chronic Conditions Follow-Up  Subjective: Adriana Young is a 63 y.o. female who presents for chronic conditions follow-up.   Her concerns today include:  - Doing well on Valsartan  and Hydrochlorothiazide , no issues/concerns. She does not complain of red flag symptoms such as but not limited to chest pain, shortness of breath, worst headache of life, nausea/vomiting.  - Doing well on Atorvastatin , no issues/concerns.  - Doing well on Levothyroxine , no issues/concerns.  - Doing well on Hydroxyzine , no issues/concerns. She denies thoughts of self-harm, suicidal ideations, homicidal ideations. - Established with Endocrinology for diabetes and related chronic conditions management.  - Established with Nephrology.   Patient Active Problem List   Diagnosis Date Noted   Type 2 diabetes mellitus (HCC) 07/16/2022   Bacterial vaginitis 02/24/2021   Astigmatism of left eye 12/27/2015   Corneal scar, left eye 12/27/2015   Degenerative joint disease (DJD) of hip 10/14/2015   Fatty liver 09/19/2010   Hypothyroidism 06/18/2008   Hyperlipidemia with target LDL less than 100 06/04/2008   Essential hypertension 06/04/2008   GERD 06/04/2008   Generalized OA 06/04/2008     Current Outpatient Medications on File Prior to Visit  Medication Sig Dispense Refill   cyanocobalamin  (VITAMIN B12) 1000 MCG tablet Take 1,000 mcg by mouth daily.     Ginger, Zingiber officinalis, (GINGER ROOT) 550 MG CAPS Take 550 capsules by mouth daily.     Magnesium 250 MG CAPS Take 250 mg by mouth daily.     metFORMIN  (GLUCOPHAGE -XR) 500 MG 24 hr tablet TAKE 1 TABLET BY MOUTH EVERY DAY WITH BREAKFAST 30 tablet 0   Probiotic Product (PROBIOTIC DAILY PO) Take 1 capsule by mouth daily.     Cholecalciferol (CVS D3) 125 MCG (5000 UT) capsule Take 5,000 Units by mouth daily. (Patient not taking: Reported on 09/07/2023)     Multiple Vitamins-Minerals (CVS  SPECTRAVITE PO) Take 1 tablet by mouth daily. (Patient not taking: Reported on 09/07/2023)     No current facility-administered medications on file prior to visit.    Allergies  Allergen Reactions   Bee Venom Anaphylaxis   Other Hives, Shortness Of Breath, Itching, Swelling and Rash    Bee stings    Crestor  [Rosuvastatin ]     Myalgias    Poison Ivy Extract Rash    Social History   Socioeconomic History   Marital status: Married    Spouse name: Not on file   Number of children: Not on file   Years of education: Not on file   Highest education level: GED or equivalent  Occupational History   Not on file  Tobacco Use   Smoking status: Former    Current packs/day: 0.00    Average packs/day: 1 pack/day for 25.0 years (25.0 ttl pk-yrs)    Types: Cigarettes    Start date: 05/26/1975    Quit date: 05/25/2000    Years since quitting: 23.8    Passive exposure: Past   Smokeless tobacco: Never  Vaping Use   Vaping status: Some Days   Start date: 11/26/2018  Substance and Sexual Activity   Alcohol use: No   Drug use: No   Sexual activity: Yes  Other Topics Concern   Not on file  Social History Narrative   Social History      Diet? Just watching- cutting out bread, drinking water      Do you drink/eat things with caffeine? yes  Marital status?        married                            What year were you married? 1980      Do you live in a house, apartment, assisted living, condo, trailer, etc.? house      Is it one or more stories? one      How many persons live in your home? 3      Do you have any pets in your home? (please list) yes 2 dogs, 3 cats, 3 fish      Highest level of education completed? 2 year college      Current or past profession:      Do you exercise?         yes                             Type & how often? gym      Advanced Directives      Do you have a living will? yes      Do you have a DNR form?                                  If not, do  you want to discuss one? no      Do you have signed POA/HPOA for forms? yes      Functional Status      Do you have difficulty bathing or dressing yourself? no      Do you have difficulty preparing food or eating? no      Do you have difficulty managing your medications? no      Do you have difficulty managing your finances? no      Do you have difficulty affording your medications? no   Social Drivers of Corporate investment banker Strain: Low Risk  (03/06/2024)   Overall Financial Resource Strain (CARDIA)    Difficulty of Paying Living Expenses: Not hard at all  Food Insecurity: No Food Insecurity (03/06/2024)   Hunger Vital Sign    Worried About Running Out of Food in the Last Year: Never true    Ran Out of Food in the Last Year: Never true  Transportation Needs: No Transportation Needs (03/06/2024)   PRAPARE - Administrator, Civil Service (Medical): No    Lack of Transportation (Non-Medical): No  Physical Activity: Insufficiently Active (03/06/2024)   Exercise Vital Sign    Days of Exercise per Week: 3 days    Minutes of Exercise per Session: 20 min  Stress: No Stress Concern Present (03/06/2024)   [employer] of Occupational Health - Occupational Stress Questionnaire    Feeling of Stress: Only a little  Social Connections: Socially Isolated (03/06/2024)   Social Connection and Isolation Panel    Frequency of Communication with Friends and Family: Once a week    Frequency of Social Gatherings with Friends and Family: Never    Attends Religious Services: Never    Database administrator or Organizations: No    Attends Engineer, structural: Not on file    Marital Status: Married  Intimate Partner Violence: Not At Risk (09/07/2023)   Humiliation, Afraid, Rape, and Kick questionnaire    Fear of Current or Ex-Partner: No  Emotionally Abused: No    Physically Abused: No    Sexually Abused: No    Family History  Problem Relation Age of  Onset   Heart disease Mother    Diabetes Mother    Hypertension Mother    Alcohol abuse Other    Arthritis Other    Hyperlipidemia Other    Hypertension Other    Stroke Other        Female 1st degree relative < 60   Diabetes Maternal Grandmother    Colon cancer Neg Hx    Esophageal cancer Neg Hx    Stomach cancer Neg Hx    Rectal cancer Neg Hx     Past Surgical History:  Procedure Laterality Date   BREAST SURGERY     Lumpectomy- left breast, no lymph node involvement    ROS: Review of Systems Negative except as stated above  PHYSICAL EXAM: BP 117/80   Pulse 70   Temp 98.1 F (36.7 C) (Oral)   Resp 16   Ht 5' 9 (1.753 m)   Wt 223 lb 9.6 oz (101.4 kg)   LMP 10/25/2014   SpO2 96%   BMI 33.02 kg/m   Physical Exam HENT:     Head: Normocephalic and atraumatic.     Nose: Nose normal.     Mouth/Throat:     Mouth: Mucous membranes are moist.     Pharynx: Oropharynx is clear.  Eyes:     Extraocular Movements: Extraocular movements intact.     Conjunctiva/sclera: Conjunctivae normal.     Pupils: Pupils are equal, round, and reactive to light.  Cardiovascular:     Rate and Rhythm: Normal rate and regular rhythm.     Pulses: Normal pulses.     Heart sounds: Normal heart sounds.  Pulmonary:     Effort: Pulmonary effort is normal.     Breath sounds: Normal breath sounds.  Musculoskeletal:        General: Normal range of motion.     Cervical back: Normal range of motion and neck supple.  Neurological:     General: No focal deficit present.     Mental Status: She is alert and oriented to person, place, and time.  Psychiatric:        Mood and Affect: Mood normal.        Behavior: Behavior normal.     ASSESSMENT AND PLAN: 1. Primary hypertension (Primary) - Continue Valsartan  and Hydrochlorothiazide  as prescribed.  - Counseled on blood pressure goal of less than 130/80, low-sodium, DASH diet, medication compliance, and 150 minutes of moderate intensity exercise  per week as tolerated. Counseled on medication adherence and adverse effects. - Follow-up with primary provider in 3 months or sooner if needed. - valsartan  (DIOVAN ) 160 MG tablet; Take 1 tablet (160 mg total) by mouth daily.  Dispense: 30 tablet; Refill: 2 - hydrochlorothiazide  (HYDRODIURIL ) 25 MG tablet; Take 1 tablet (25 mg total) by mouth daily.  Dispense: 30 tablet; Refill: 2  2. Hyperlipidemia, unspecified hyperlipidemia type - Continue Atorvastatin  as prescribed. Counseled on medication adherence/adverse effects.  - Follow-up with primary provider in 3 months or sooner if needed. - atorvastatin  (LIPITOR) 80 MG tablet; Take 1 tablet (80 mg total) by mouth daily.  Dispense: 30 tablet; Refill: 2  3. Hypothyroidism, unspecified type - Continue Levothyroxine  as prescribed. Counseled on medication adherence/adverse effects.  - Follow-up with primary provider in 3 months or sooner if needed. - levothyroxine  (SYNTHROID ) 137 MCG tablet; Take 1 tablet (137 mcg total)  by mouth daily.  Dispense: 30 tablet; Refill: 2  4. Anxiety - Patient denies thoughts of self-harm, suicidal ideations, homicidal ideations. - Continue Hydroxyzine  as prescribed. Counseled on medication adherence/adverse effects.  - Follow-up with primary provider in 3 months or sooner if needed. - hydrOXYzine  (VISTARIL ) 25 MG capsule; Take 1 capsule (25 mg total) by mouth every 8 (eight) hours as needed.  Dispense: 30 capsule; Refill: 2   Patient was given the opportunity to ask questions.  Patient verbalized understanding of the plan and was able to repeat key elements of the plan. Patient was given clear instructions to go to Emergency Department or return to medical center if symptoms don't improve, worsen, or new problems develop.The patient verbalized understanding.  Requested Prescriptions   Signed Prescriptions Disp Refills   valsartan  (DIOVAN ) 160 MG tablet 30 tablet 2    Sig: Take 1 tablet (160 mg total) by mouth  daily.   hydrochlorothiazide  (HYDRODIURIL ) 25 MG tablet 30 tablet 2    Sig: Take 1 tablet (25 mg total) by mouth daily.   hydrOXYzine  (VISTARIL ) 25 MG capsule 30 capsule 2    Sig: Take 1 capsule (25 mg total) by mouth every 8 (eight) hours as needed.   levothyroxine  (SYNTHROID ) 137 MCG tablet 30 tablet 2    Sig: Take 1 tablet (137 mcg total) by mouth daily.   atorvastatin  (LIPITOR) 80 MG tablet 30 tablet 2    Sig: Take 1 tablet (80 mg total) by mouth daily.    Return in about 3 months (around 06/09/2024) for Follow-Up or next available chronic conditions.  Greig JINNY Drones, NP

## 2024-03-16 ENCOUNTER — Ambulatory Visit
Admission: RE | Admit: 2024-03-16 | Discharge: 2024-03-16 | Disposition: A | Discharge status: Home / Self Care | Source: Ambulatory Visit | Attending: Emergency Medicine | Admitting: Emergency Medicine

## 2024-03-16 ENCOUNTER — Other Ambulatory Visit: Payer: Self-pay

## 2024-03-16 VITALS — BP 137/82 | HR 64 | Temp 98.7°F | Resp 18

## 2024-03-16 DIAGNOSIS — M549 Dorsalgia, unspecified: Secondary | ICD-10-CM | POA: Diagnosis not present

## 2024-03-16 DIAGNOSIS — S46811A Strain of other muscles, fascia and tendons at shoulder and upper arm level, right arm, initial encounter: Secondary | ICD-10-CM | POA: Diagnosis not present

## 2024-03-16 MED ORDER — BACLOFEN 5 MG PO TABS: 5.0000 mg | ORAL_TABLET | Freq: Every evening | ORAL | 0 refills | Status: AC

## 2024-03-16 MED ORDER — PREDNISONE 20 MG PO TABS: 40.0000 mg | ORAL_TABLET | Freq: Every day | ORAL | 0 refills | Status: AC

## 2024-03-16 NOTE — ED Provider Notes (Signed)
 [facility]    CSN: [id number] Arrival date & time: 03/16/24  1036      History   Chief Complaint Chief Complaint  Patient presents with   Shoulder Pain    HPI Adriana Young is a 63 y.o. female.  Here with a few day history of right upper back pain.  Started after she was lifting and moving heavy plants.  Certain movements cause pain - every now and then feels a shooting sensation into the arm.  She has used Tylenol, Aspercreme, ice and heat. With temporary relief   Denies any paresthesias or weakness No prior neck, back, shoulder injury known  Reports CKD history however labs from last month reveal Cr 0.99, GFR 64, BUN 11 DM history, A1c 3 months ago was 6.4  Past Medical History:  Diagnosis Date   Allergy    Arthritis    Phreesia 06/30/2020   GERD (gastroesophageal reflux disease)    Glaucoma    Headache(784.0)    Hyperlipidemia    Hypertension    Osteoarthritis    Thyroid  disease    hypo   Type II or unspecified type diabetes mellitus without mention of complication, uncontrolled 09/19/2010    Patient Active Problem List   Diagnosis Date Noted   Type 2 diabetes mellitus (HCC) 07/16/2022   Bacterial vaginitis 02/24/2021   Astigmatism of left eye 12/27/2015   Corneal scar, left eye 12/27/2015   Degenerative joint disease (DJD) of hip 10/14/2015   Fatty liver 09/19/2010   Hypothyroidism 06/18/2008   Hyperlipidemia with target LDL less than 100 06/04/2008   Essential hypertension 06/04/2008   GERD 06/04/2008   Generalized OA 06/04/2008    Past Surgical History:  Procedure Laterality Date   BREAST SURGERY     Lumpectomy- left breast, no lymph node involvement    OB History   No obstetric history on file.      Home Medications    Prior to Admission medications   Medication Sig Start Date End Date Taking? Authorizing Provider  Baclofen 5 MG TABS Take 1 tablet (5 mg total) by mouth at bedtime. 03/16/24  Yes Wellington Winegarden, Asberry, PA-C   predniSONE  (DELTASONE ) 20 MG tablet Take 2 tablets (40 mg total) by mouth daily with breakfast for 5 days. 03/16/24 03/21/24 Yes Nishi Neiswonger, Asberry, PA-C  atorvastatin  (LIPITOR) 80 MG tablet Take 1 tablet (80 mg total) by mouth daily. 03/09/24   Lorren Greig PARAS, NP  Cholecalciferol (CVS D3) 125 MCG (5000 UT) capsule Take 5,000 Units by mouth daily. Patient not taking: Reported on 09/07/2023    [provider]  cyanocobalamin  (VITAMIN B12) 1000 MCG tablet Take 1,000 mcg by mouth daily.    [provider]  Ginger, Zingiber officinalis, (GINGER ROOT) 550 MG CAPS Take 550 capsules by mouth daily.    [provider]  hydrochlorothiazide  (HYDRODIURIL ) 25 MG tablet Take 1 tablet (25 mg total) by mouth daily. 03/09/24 06/07/24  Lorren Greig PARAS, NP  hydrOXYzine  (VISTARIL ) 25 MG capsule Take 1 capsule (25 mg total) by mouth every 8 (eight) hours as needed. 03/09/24   Lorren Greig PARAS, NP  levothyroxine  (SYNTHROID ) 137 MCG tablet Take 1 tablet (137 mcg total) by mouth daily. 03/09/24   Lorren Greig PARAS, NP  Magnesium 250 MG CAPS Take 250 mg by mouth daily.    [provider]  metFORMIN  (GLUCOPHAGE -XR) 500 MG 24 hr tablet TAKE 1 TABLET BY MOUTH EVERY DAY WITH BREAKFAST 02/29/24   Tanda Bleacher, MD  Multiple Vitamins-Minerals (CVS SPECTRAVITE  PO) Take 1 tablet by mouth daily. Patient not taking: Reported on 09/07/2023    [provider]  Probiotic Product (PROBIOTIC DAILY PO) Take 1 capsule by mouth daily.    [provider]  valsartan  (DIOVAN ) 160 MG tablet Take 1 tablet (160 mg total) by mouth daily. 03/09/24   Lorren Greig PARAS, NP    Family History Family History  Problem Relation Age of Onset   Heart disease Mother    Diabetes Mother    Hypertension Mother    Alcohol abuse Other    Arthritis Other    Hyperlipidemia Other    Hypertension Other    Stroke Other        Female 1st degree relative < 60   Diabetes Maternal Grandmother    Colon cancer Neg  Hx    Esophageal cancer Neg Hx    Stomach cancer Neg Hx    Rectal cancer Neg Hx     Social History Social History   Tobacco Use   Smoking status: Former    Current packs/day: 0.00    Average packs/day: 1 pack/day for 25.0 years (25.0 ttl pk-yrs)    Types: Cigarettes    Start date: 05/26/1975    Quit date: 05/25/2000    Years since quitting: 23.8    Passive exposure: Past   Smokeless tobacco: Never  Vaping Use   Vaping status: Some Days   Start date: 11/26/2018  Substance Use Topics   Alcohol use: No   Drug use: No     Allergies   Bee venom, Other, Crestor  [rosuvastatin ], and Poison ivy extract   Review of Systems Review of Systems As per HPI  Physical Exam Triage Vital Signs ED Triage Vitals  Encounter Vitals Group     BP 03/16/24 1122 137/82     Girls Systolic BP Percentile --      Girls Diastolic BP Percentile --      Boys Systolic BP Percentile --      Boys Diastolic BP Percentile --      Pulse Rate 03/16/24 1122 64     Resp 03/16/24 1122 18     Temp 03/16/24 1122 98.7 F (37.1 C)     Temp Source 03/16/24 1122 Oral     SpO2 03/16/24 1122 98 %     Weight --      Height --      Head Circumference --      Peak Flow --      Pain Score 03/16/24 1120 9     Pain Loc --      Pain Education --      Exclude from Growth Chart --    No data found.  Updated Vital Signs BP 137/82 (BP Location: Left Arm)   Pulse 64   Temp 98.7 F (37.1 C) (Oral)   Resp 18   LMP 10/25/2014   SpO2 98%    Physical Exam Vitals and nursing note reviewed.  Constitutional:      General: She is not in acute distress. HENT:     Mouth/Throat:     Pharynx: Oropharynx is clear.  Cardiovascular:     Rate and Rhythm: Normal rate and regular rhythm.     Pulses: Normal pulses.          Radial pulses are 2+ on the right side and 2+ on the left side.     Heart sounds: Normal heart sounds.  Pulmonary:     Effort: Pulmonary effort is normal.  Breath sounds: Normal breath sounds.   Musculoskeletal:        General: Tenderness present.     Cervical back: Normal range of motion.       Back:     Comments: Full ROM upper extremities. Full extension of right shoulder elicits pain right upper back. Area of muscular tenderness Right trapezius. No bony tenderness of scapula, shoulder, clavicle.   Skin:    Capillary Refill: Capillary refill takes less than 2 seconds.  Neurological:     Mental Status: She is alert and oriented to person, place, and time.     Comments: Strength and sensation equal, intact. Radial pulses 2+.      UC Treatments / Results  Labs (all labs ordered are listed, but only abnormal results are displayed) Labs Reviewed - No data to display  EKG   Radiology No results found.  Procedures Procedures (including critical care time)  Medications Ordered in UC Medications - No data to display  Initial Impression / Assessment and Plan / UC Course  I have reviewed the triage vital signs and the nursing notes.  Pertinent labs & imaging results that were available during my care of the patient were reviewed by me and considered in my medical decision making (see chart for details).  Stable vitals, neurologically intact  No direct trauma or bony tenderness to warrant xray imaging at this time. Consistent with muscular strain.  Offered IM steroid in clinic, patient declines, prefers oral medication   Will try prednisone  40 mg daily x 5 days Baclofen 5 mg nightly prn, drowsy precautions.  Other supportive care and therapies discussed. If not improving over weekend she will return. PCP follow up. ED Precautions discussed. Agrees to plan., no questions at this time   Final Clinical Impressions(s) / UC Diagnoses   Final diagnoses:  Strain of right trapezius muscle, initial encounter  Upper back pain on right side     Discharge Instructions      Prednisone  -- 2 tablets daily for 5 days This will raise your blood sugars temporarily.   (Steroid)  Baclofen -- 1 tablet nightly as needed This may make you drowsy so please use caution  (Muscle relaxer)  Continue tylenol, hot pad, and topical therapy such as IcyHot or Biofreeze.  If over the weekend you are not having improvement in symptoms, please return.  Otherwise follow-up with your primary care provider. Please go to the emergency department if symptoms worsen.,     ED Prescriptions     Medication Sig Dispense Auth. Provider   predniSONE  (DELTASONE ) 20 MG tablet Take 2 tablets (40 mg total) by mouth daily with breakfast for 5 days. 10 tablet Micaella Gitto, PA-C   Baclofen 5 MG TABS Take 1 tablet (5 mg total) by mouth at bedtime. 20 tablet Adis Sturgill, [place]berry, PA-C      PDMP not reviewed this encounter.   Krystl Wickware, [place]berry, PA-C 03/16/24 1210

## 2024-03-16 NOTE — ED Triage Notes (Signed)
 Sharp pain then sometimes goes down arm, this is right side. - Entered by patient  States she moved large plants inside over the weekend and now having pain in right shoulder. She has been taking tylenol, using ice/heat, and using Aspercreme. States she gets relief with certain movements

## 2024-03-16 NOTE — Discharge Instructions (Addendum)
 Prednisone  -- 2 tablets daily for 5 days This will raise your blood sugars temporarily.  (Steroid)  Baclofen -- 1 tablet nightly as needed This may make you drowsy so please use caution  (Muscle relaxer)  Continue tylenol, hot pad, and topical therapy such as IcyHot or Biofreeze.  If over the weekend you are not having improvement in symptoms, please return.  Otherwise follow-up with your primary care provider. Please go to the emergency department if symptoms worsen.,

## 2024-03-24 ENCOUNTER — Other Ambulatory Visit: Payer: Self-pay | Admitting: Family Medicine

## 2024-03-24 DIAGNOSIS — E1165 Type 2 diabetes mellitus with hyperglycemia: Secondary | ICD-10-CM

## 2024-03-24 NOTE — Telephone Encounter (Signed)
 Complete

## 2024-04-05 DIAGNOSIS — M9903 Segmental and somatic dysfunction of lumbar region: Secondary | ICD-10-CM | POA: Diagnosis not present

## 2024-04-05 DIAGNOSIS — M9904 Segmental and somatic dysfunction of sacral region: Secondary | ICD-10-CM | POA: Diagnosis not present

## 2024-04-05 DIAGNOSIS — M9905 Segmental and somatic dysfunction of pelvic region: Secondary | ICD-10-CM | POA: Diagnosis not present

## 2024-04-05 DIAGNOSIS — M9902 Segmental and somatic dysfunction of thoracic region: Secondary | ICD-10-CM | POA: Diagnosis not present

## 2024-04-13 DIAGNOSIS — M9902 Segmental and somatic dysfunction of thoracic region: Secondary | ICD-10-CM | POA: Diagnosis not present

## 2024-04-13 DIAGNOSIS — M9903 Segmental and somatic dysfunction of lumbar region: Secondary | ICD-10-CM | POA: Diagnosis not present

## 2024-04-13 DIAGNOSIS — M9905 Segmental and somatic dysfunction of pelvic region: Secondary | ICD-10-CM | POA: Diagnosis not present

## 2024-04-13 DIAGNOSIS — M9904 Segmental and somatic dysfunction of sacral region: Secondary | ICD-10-CM | POA: Diagnosis not present

## 2024-04-26 DIAGNOSIS — M9904 Segmental and somatic dysfunction of sacral region: Secondary | ICD-10-CM | POA: Diagnosis not present

## 2024-04-26 DIAGNOSIS — M9903 Segmental and somatic dysfunction of lumbar region: Secondary | ICD-10-CM | POA: Diagnosis not present

## 2024-04-26 DIAGNOSIS — M9905 Segmental and somatic dysfunction of pelvic region: Secondary | ICD-10-CM | POA: Diagnosis not present

## 2024-04-26 DIAGNOSIS — M9902 Segmental and somatic dysfunction of thoracic region: Secondary | ICD-10-CM | POA: Diagnosis not present

## 2024-05-04 NOTE — Progress Notes (Signed)
 Adriana Young                                          MRN: [id number]   05/04/2024   The [facility] Quality Team Specialist reviewed this patient medical record for the purposes of chart review for care gap closure. The following were reviewed: chart review for care gap closure-breast cancer screening, cervical cancer screening, and diabetic eye exam.    [facility] Quality Team

## 2024-06-24 ENCOUNTER — Other Ambulatory Visit: Payer: Self-pay | Admitting: Family

## 2024-06-24 DIAGNOSIS — E785 Hyperlipidemia, unspecified: Secondary | ICD-10-CM

## 2024-06-24 DIAGNOSIS — E1165 Type 2 diabetes mellitus with hyperglycemia: Secondary | ICD-10-CM

## 2024-09-07 ENCOUNTER — Ambulatory Visit: Admitting: Family
